# Patient Record
Sex: Female | Born: 1950 | Race: Black or African American | Hispanic: No | Smoking: Never smoker
Health system: Southern US, Community
[De-identification: ages and names within clinical notes are randomized; demographics above are authoritative.]

## PROBLEM LIST (undated history)

## (undated) DIAGNOSIS — I10 Essential (primary) hypertension: Secondary | ICD-10-CM

## (undated) DIAGNOSIS — I714 Abdominal aortic aneurysm, without rupture, unspecified: Secondary | ICD-10-CM

## (undated) DIAGNOSIS — M5430 Sciatica, unspecified side: Secondary | ICD-10-CM

## (undated) DIAGNOSIS — K5792 Diverticulitis of intestine, part unspecified, without perforation or abscess without bleeding: Secondary | ICD-10-CM

## (undated) DIAGNOSIS — I7121 Aneurysm of the ascending aorta, without rupture: Secondary | ICD-10-CM

## (undated) DIAGNOSIS — I719 Aortic aneurysm of unspecified site, without rupture: Secondary | ICD-10-CM

## (undated) DIAGNOSIS — I771 Stricture of artery: Secondary | ICD-10-CM

## (undated) DIAGNOSIS — D649 Anemia, unspecified: Secondary | ICD-10-CM

## (undated) DIAGNOSIS — G4730 Sleep apnea, unspecified: Secondary | ICD-10-CM

## (undated) DIAGNOSIS — K219 Gastro-esophageal reflux disease without esophagitis: Secondary | ICD-10-CM

## (undated) HISTORY — DX: Stricture of artery: I77.1

## (undated) HISTORY — DX: Aneurysm of the ascending aorta, without rupture: I71.21

## (undated) HISTORY — PX: CORONARY ANGIOPLASTY WITH STENT PLACEMENT: SHX49

## (undated) HISTORY — DX: Diverticulitis of intestine, part unspecified, without perforation or abscess without bleeding: K57.92

## (undated) HISTORY — DX: Gastro-esophageal reflux disease without esophagitis: K21.9

## (undated) HISTORY — DX: Sciatica, unspecified side: M54.30

## (undated) HISTORY — PX: ABDOMINAL HYSTERECTOMY: SHX81

## (undated) HISTORY — DX: Sleep apnea, unspecified: G47.30

## (undated) HISTORY — DX: Anemia, unspecified: D64.9

## (undated) HISTORY — DX: Abdominal aortic aneurysm, without rupture, unspecified: I71.40

## (undated) HISTORY — DX: Abdominal aortic aneurysm, without rupture: I71.4

## (undated) HISTORY — DX: Essential (primary) hypertension: I10

## (undated) HISTORY — DX: Aortic aneurysm of unspecified site, without rupture: I71.9

## (undated) HISTORY — PX: TOTAL ABDOMINAL HYSTERECTOMY W/ BILATERAL SALPINGOOPHORECTOMY: SHX83

---

## 2004-08-28 ENCOUNTER — Emergency Department: Payer: Self-pay | Admitting: Emergency Medicine

## 2004-08-28 ENCOUNTER — Other Ambulatory Visit: Payer: Self-pay

## 2004-08-28 IMAGING — CT CT HEAD WITHOUT CONTRAST
1 of 2 series · 16 of 30 positions shown, 20 images · non-contrast
Comparison: none

REASON FOR EXAM: faint feeling / "felt like was going to fall off chair" /
COMMENTS:

[Series 2: without · axial · non-contrast · 0.41mm/px · z∈[-146,-30]mm · 16 of 27 slices shown, 20 images]
[im 2/27  brain]
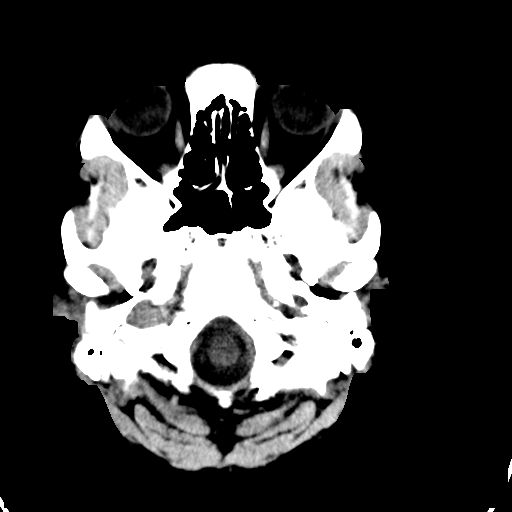
[im 2/27  bone]
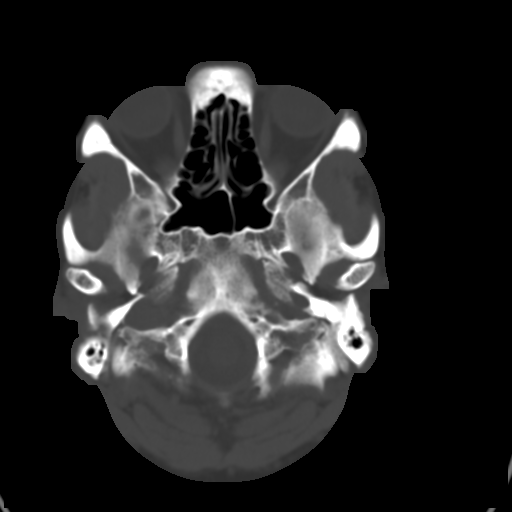
[im 4/27  brain]
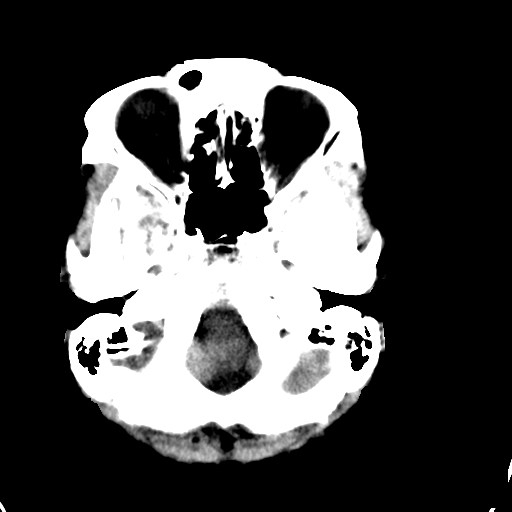
[im 5/27  brain]
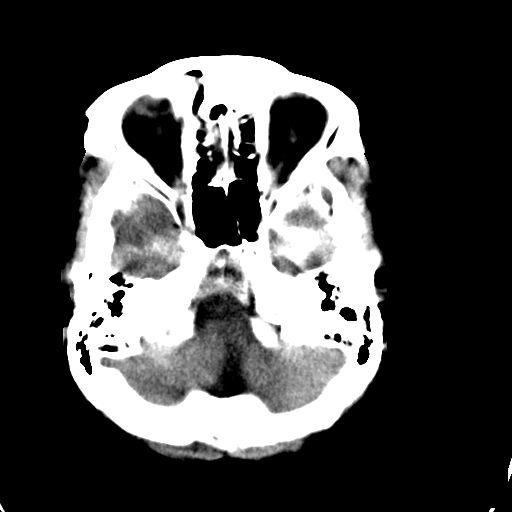
[im 7/27  brain]
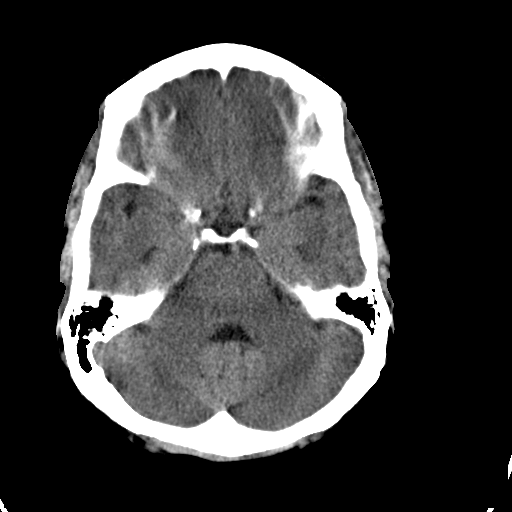
[im 8/27  brain]
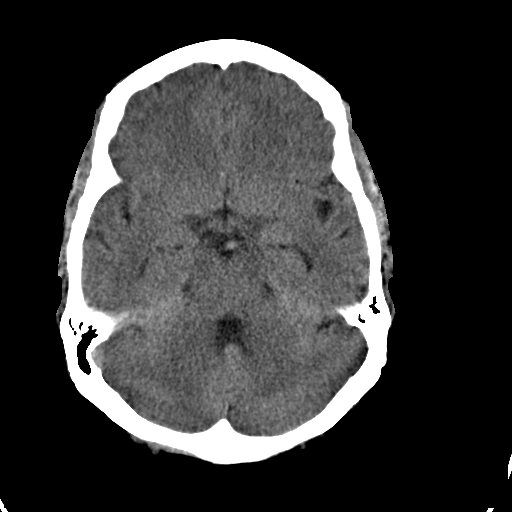
[im 8/27  bone]
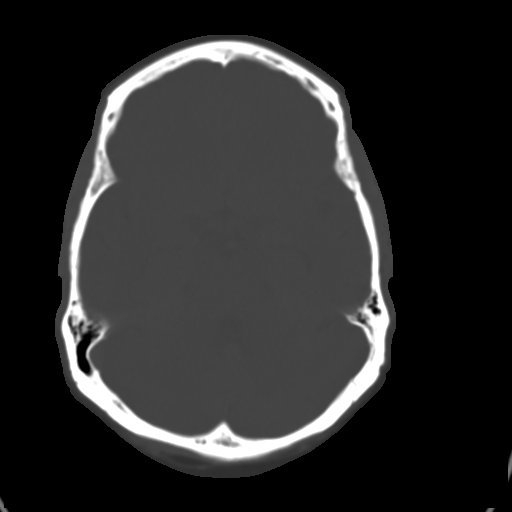
[im 10/27  brain]
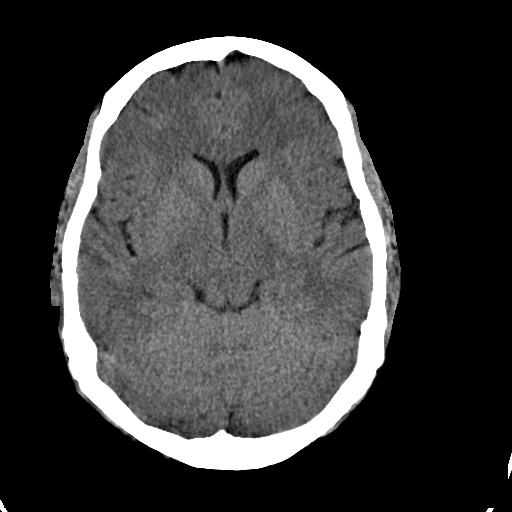
[im 11/27  brain]
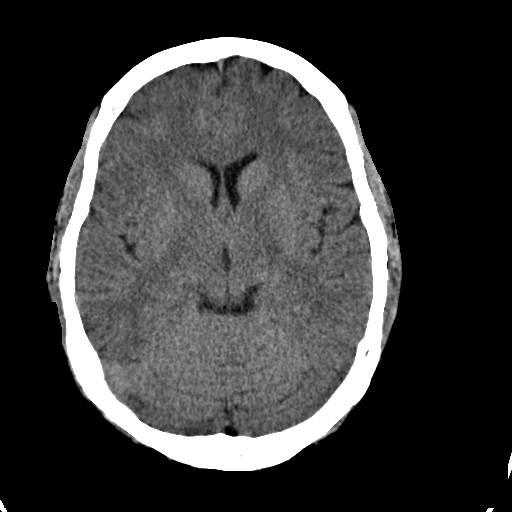
[im 13/27  brain]
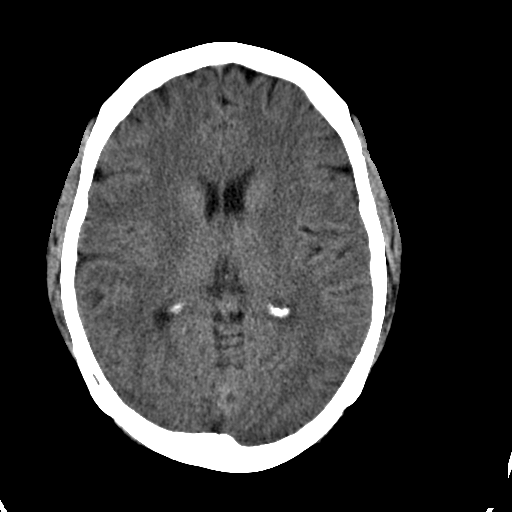
[im 14/27  brain]
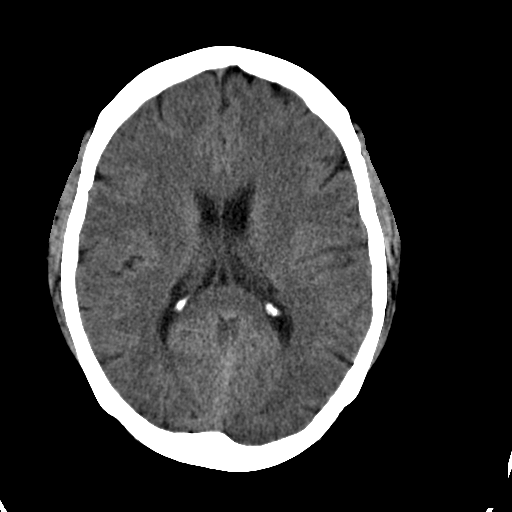
[im 14/27  bone]
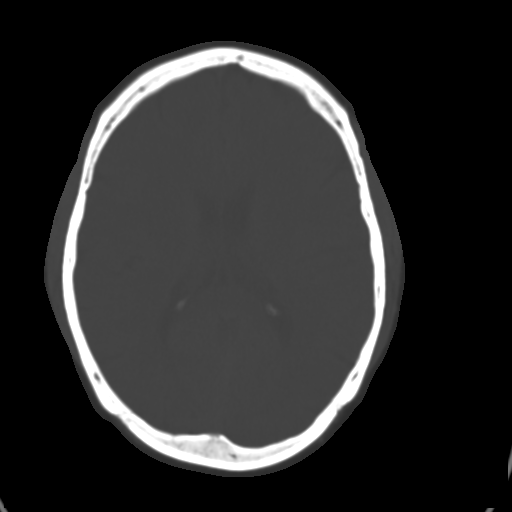
[im 16/27  brain]
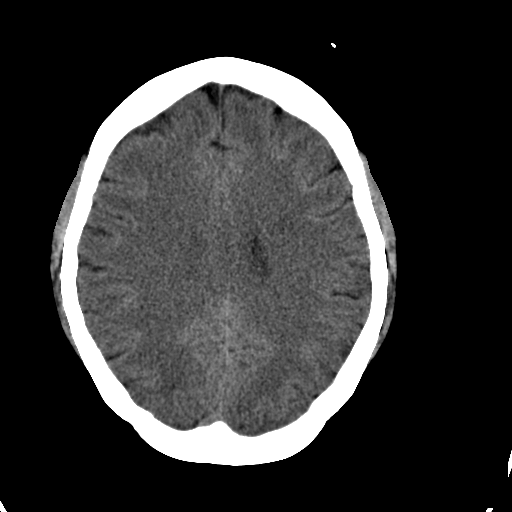
[im 17/27  brain]
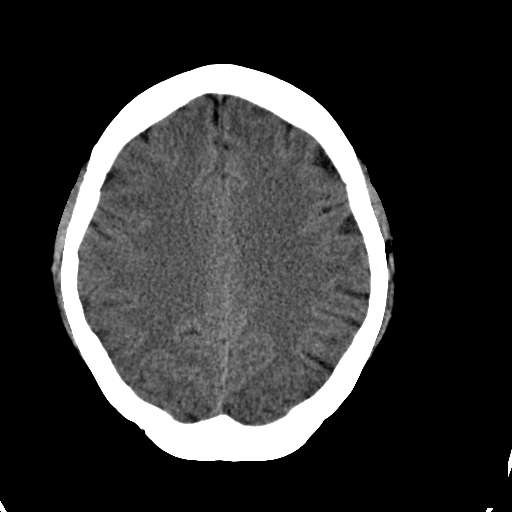
[im 19/27  brain]
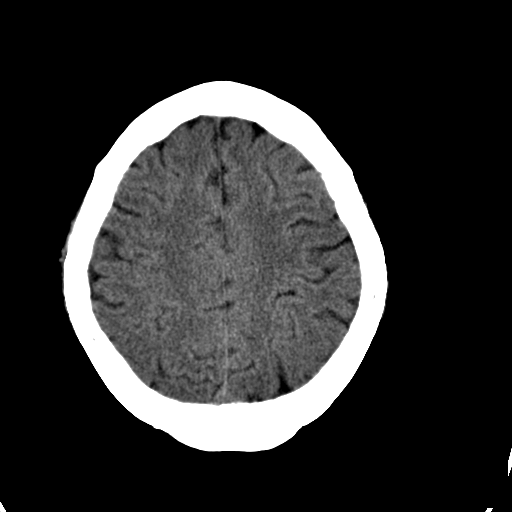
[im 20/27  brain]
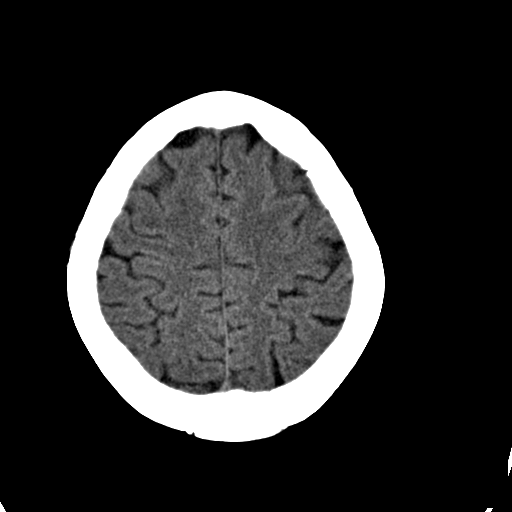
[im 20/27  bone]
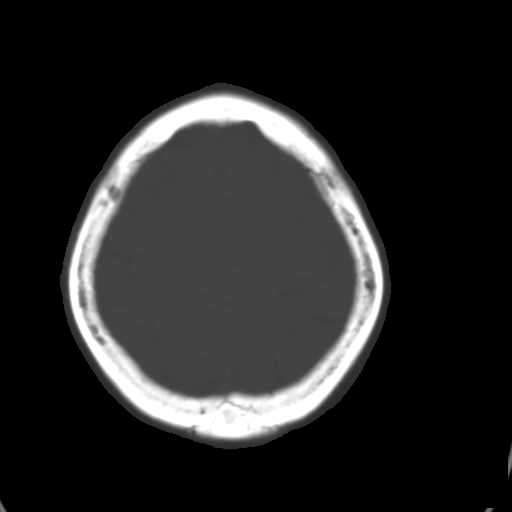
[im 22/27  brain]
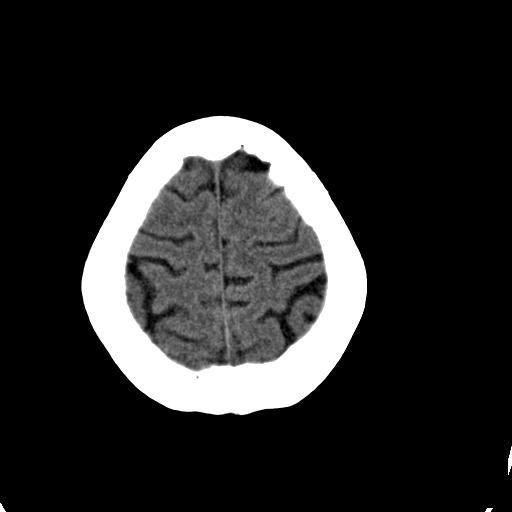
[im 23/27  brain]
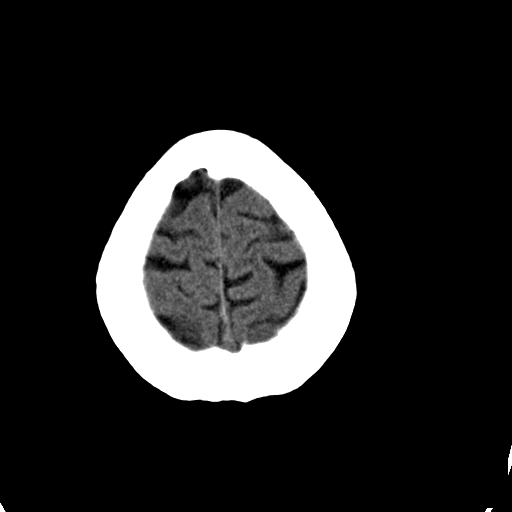
[im 25/27  brain]
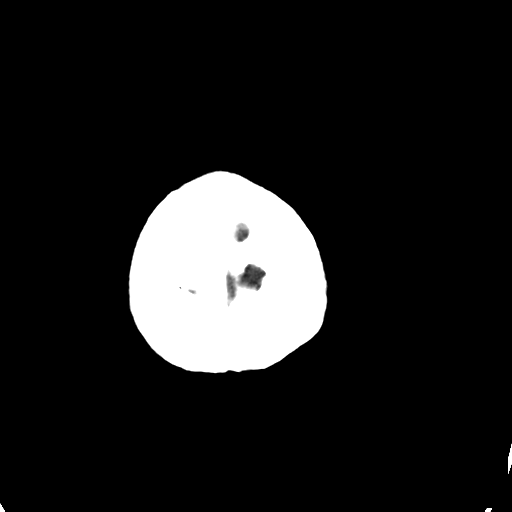

[16 of 30 positions shown; findings below may reference images not displayed]

PROCEDURE:     CT  - CT HEAD WITHOUT CONTRAST  - [DATE]  [DATE]

RESULT:     A 53 year-old-female feeling faint.

Contiguous 5 mm axial CT images were obtained from the skull base to the
vertex without intravenous contrast.

There are no prior studies for comparison.  The brain and CSF-containing
space are within normal limits without evidence for acute intracranial
hemorrhage or mass effect.  No acute bony abnormalities are seen.
IMPRESSION: 1.  No acute intracranial findings.

## 2005-02-22 ENCOUNTER — Ambulatory Visit: Payer: Self-pay | Admitting: Internal Medicine

## 2006-05-15 ENCOUNTER — Emergency Department: Payer: Self-pay | Admitting: Emergency Medicine

## 2007-03-20 ENCOUNTER — Emergency Department: Payer: Self-pay | Admitting: General Practice

## 2007-03-20 IMAGING — CT CT HEAD WITHOUT CONTRAST
1 series · 15 of 30 positions shown, 19 images · non-contrast
Comparison: none

RESULT:      CT of brain is performed emergently without contrast. There is
no prior exam for comparison.

The ventricles and sulci are normal. There is no hemorrhage. There is no
focal mass, mass-effect or midline shift. There is no evidence of edema or
territorial infarct. The bone windows demonstrate normal aeration of the
paranasal sinuses and mastoid air cells. There is no skull fracture
demonstrated. There are some low density areas within the calvarium.
Metastatic disease could present a similar fashion. This patient a history
of malignancy? A bone scan could be considered for further investigation.
These areas are intramedullary and low in attenuation. There does not appear
to be a definite cortical destruction of significant size. Myeloma would be
a consideration and may not be demonstrated on a bone scan.

[Series 2: soft tissue · axial · 0.39mm/px · z∈[-574,-438]mm · 15 of 30 slices shown, 19 images]
[im 2/30  brain]
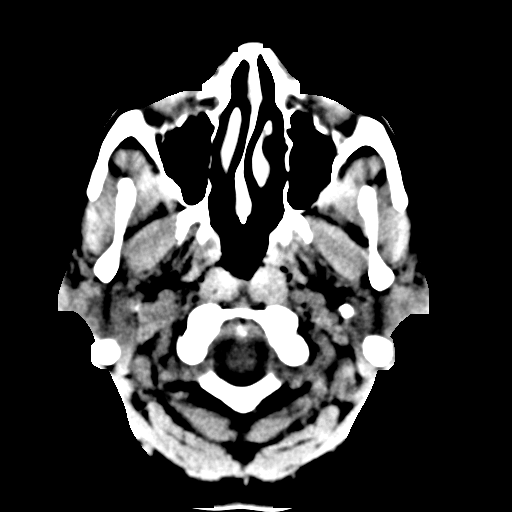
[im 2/30  bone]
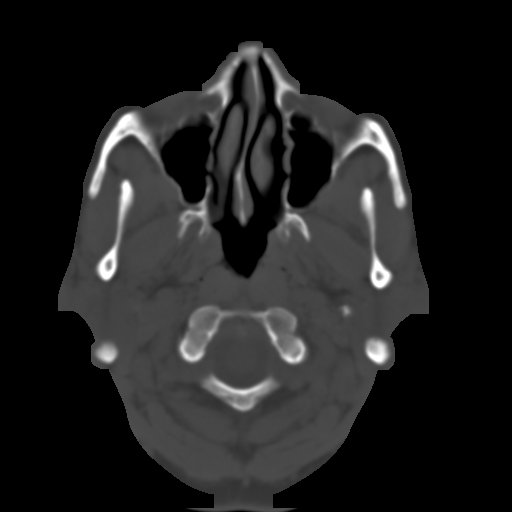
[im 4/30  brain]
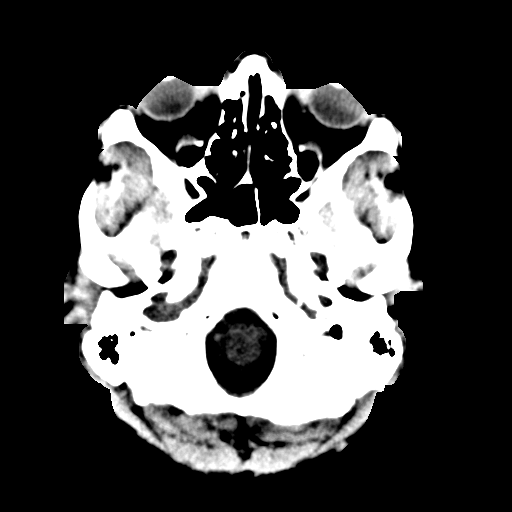
[im 6/30  brain]
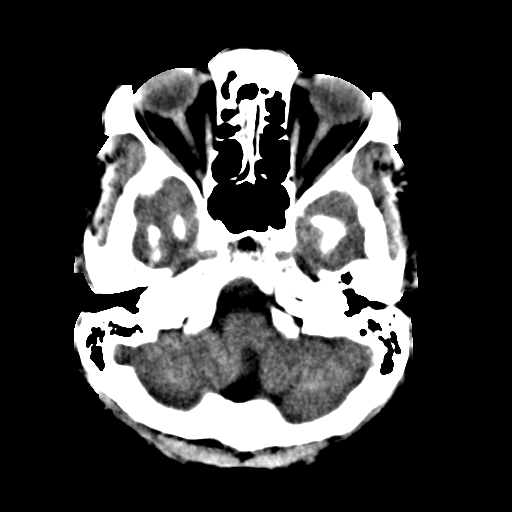
[im 8/30  brain]
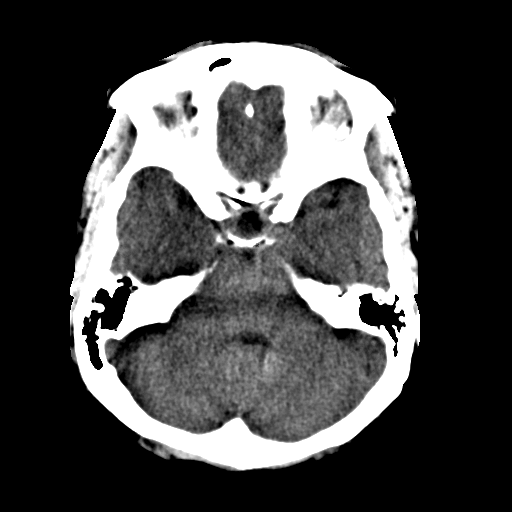
[im 10/30  brain]
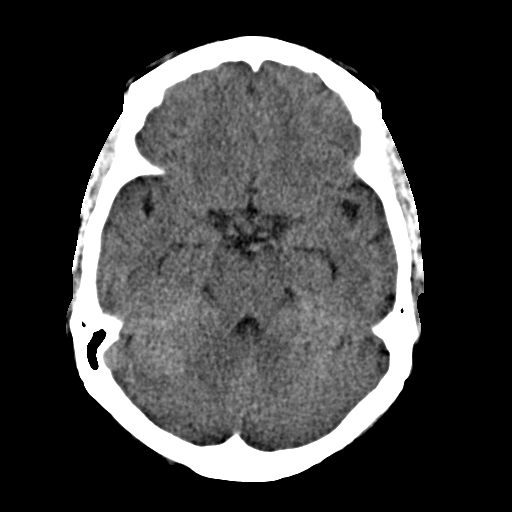
[im 10/30  bone]
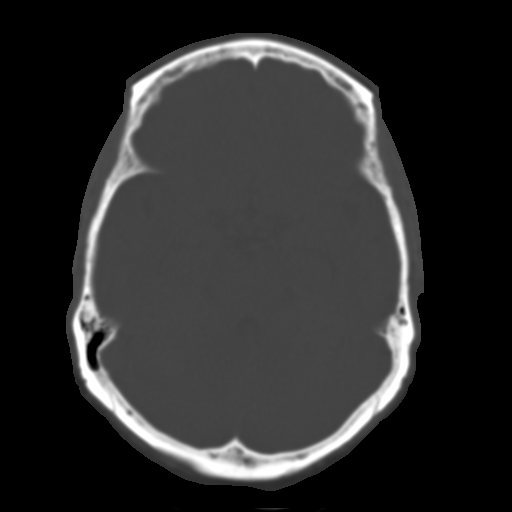
[im 12/30  brain]
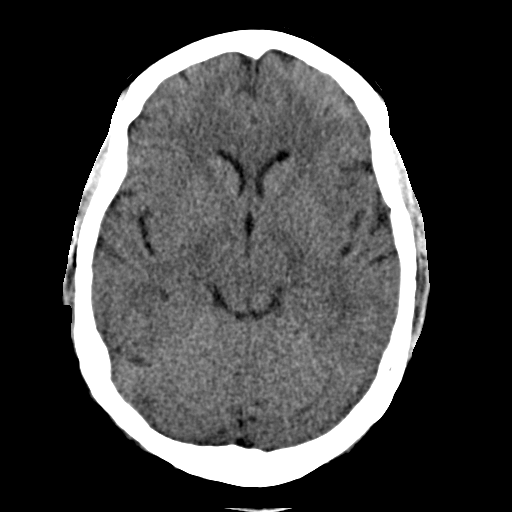
[im 14/30  brain]
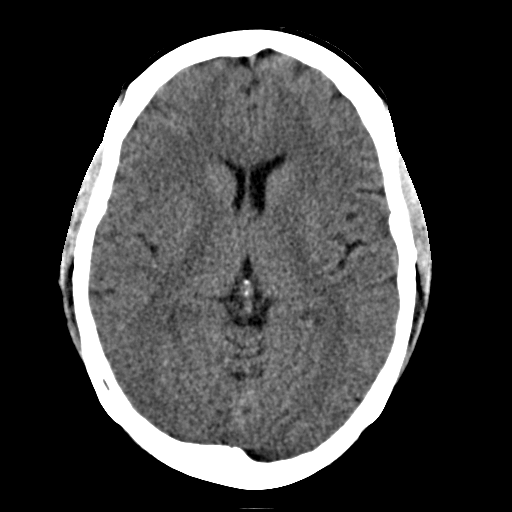
[im 16/30  brain]
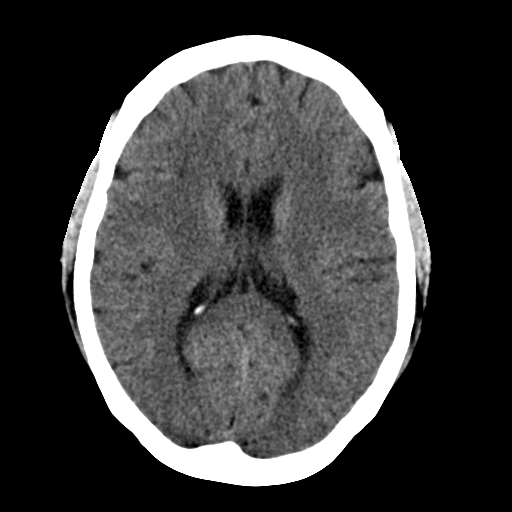
[im 17/30  brain]
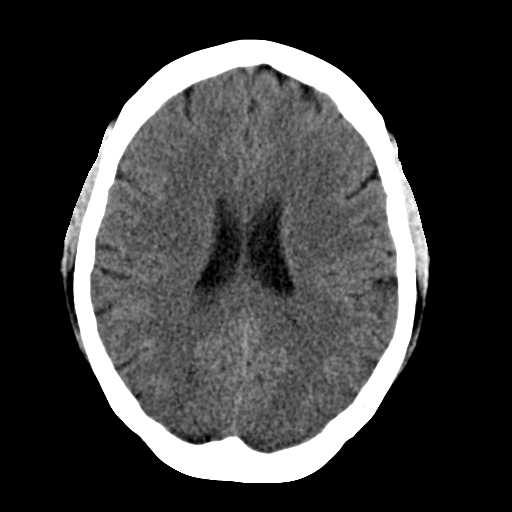
[im 17/30  bone]
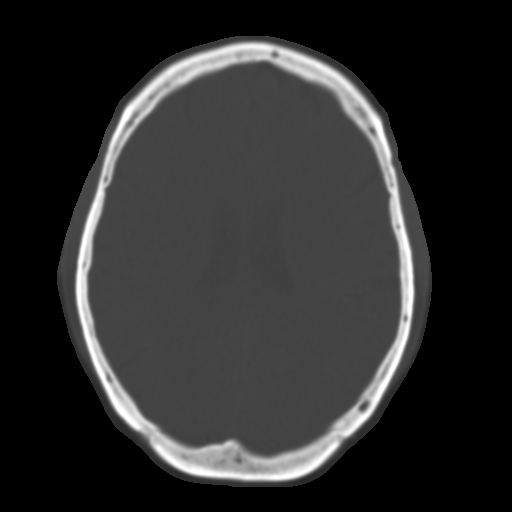
[im 19/30  brain]
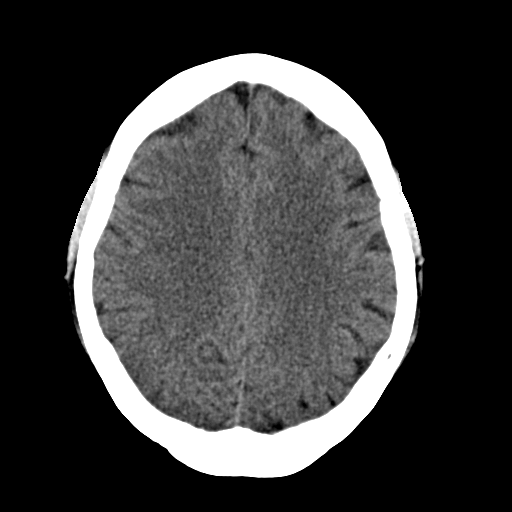
[im 21/30  brain]
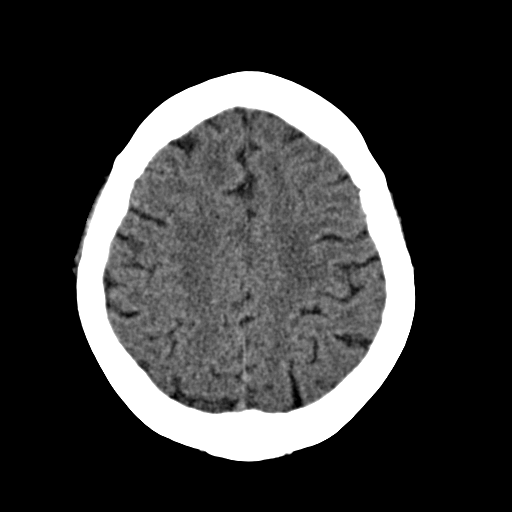
[im 23/30  brain]
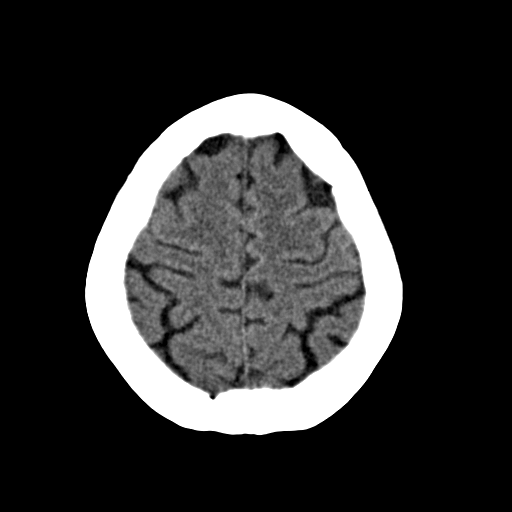
[im 25/30  brain]
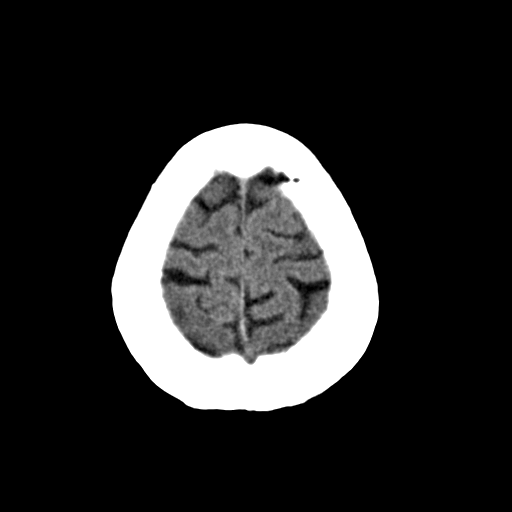
[im 25/30  bone]
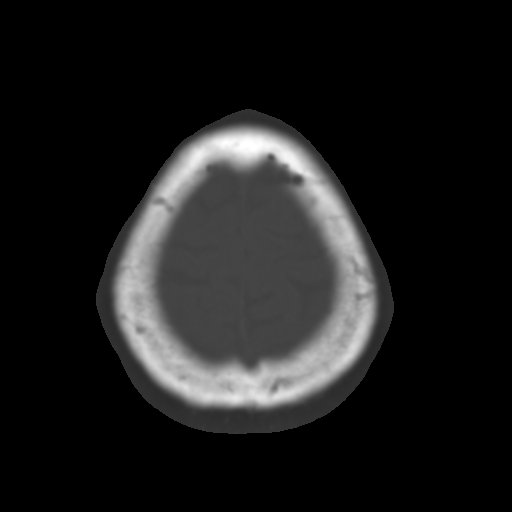
[im 27/30  brain]
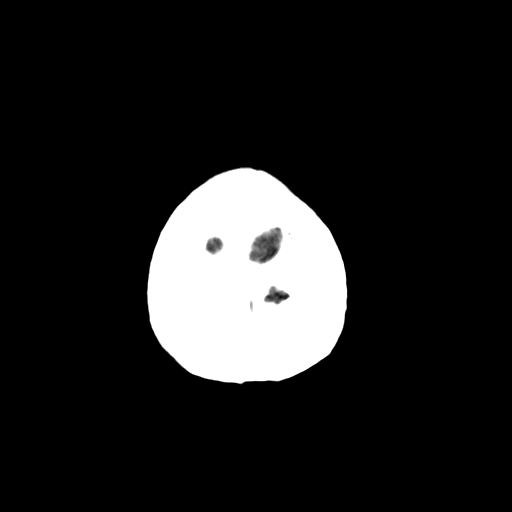
[im 29/30  brain]
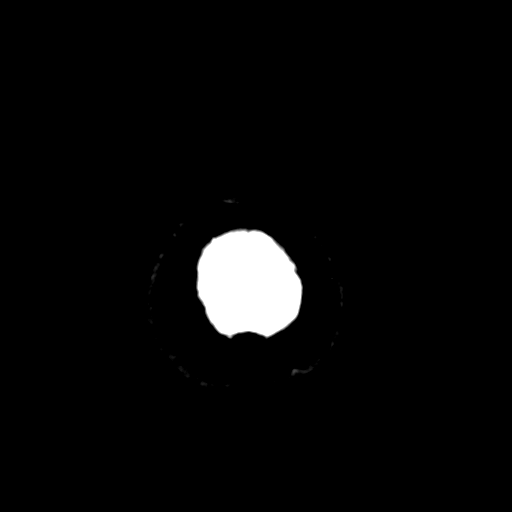

[15 of 30 positions shown; findings below may reference images not displayed]

IMPRESSION: 1. No acute intracranial abnormality.
2. Some small low density areas within the calvarium. These are nonspecific.
Patient is a history of malignancy no whole-body bone scan would be
recommended.

Amended impression:
1. The question in the body of the report should read" Does this patient
have a history of malignancy? ".

2. In the impression, the second impression third sentence should read" If
the patient has a history of malignancy then a whole-body bone scan would be
recommended. ".
3. Multiple myeloma is a consideration and may not be visible on a bone
scan. Laboratory correlation is recommended.

## 2007-03-20 IMAGING — CR DG CHEST 2V
1 series · 2 of 2 positions shown · non-contrast
Comparison: none

REASON FOR EXAM: chest pain pt in rm 8
COMMENTS:

PROCEDURE:     DXR - DXR CHEST PA (OR AP) AND LATERAL  - [DATE] [DATE]
RESULT:     The lungs are adequately inflated and clear. The heart and
pulmonary vascularity are within the limits of normal. There is no pleural
effusion. There are no acute bony abnormalities.

[Series 1: view not recorded · 0.17mm/px · 2 of 2 slices shown]
[im 1/2]
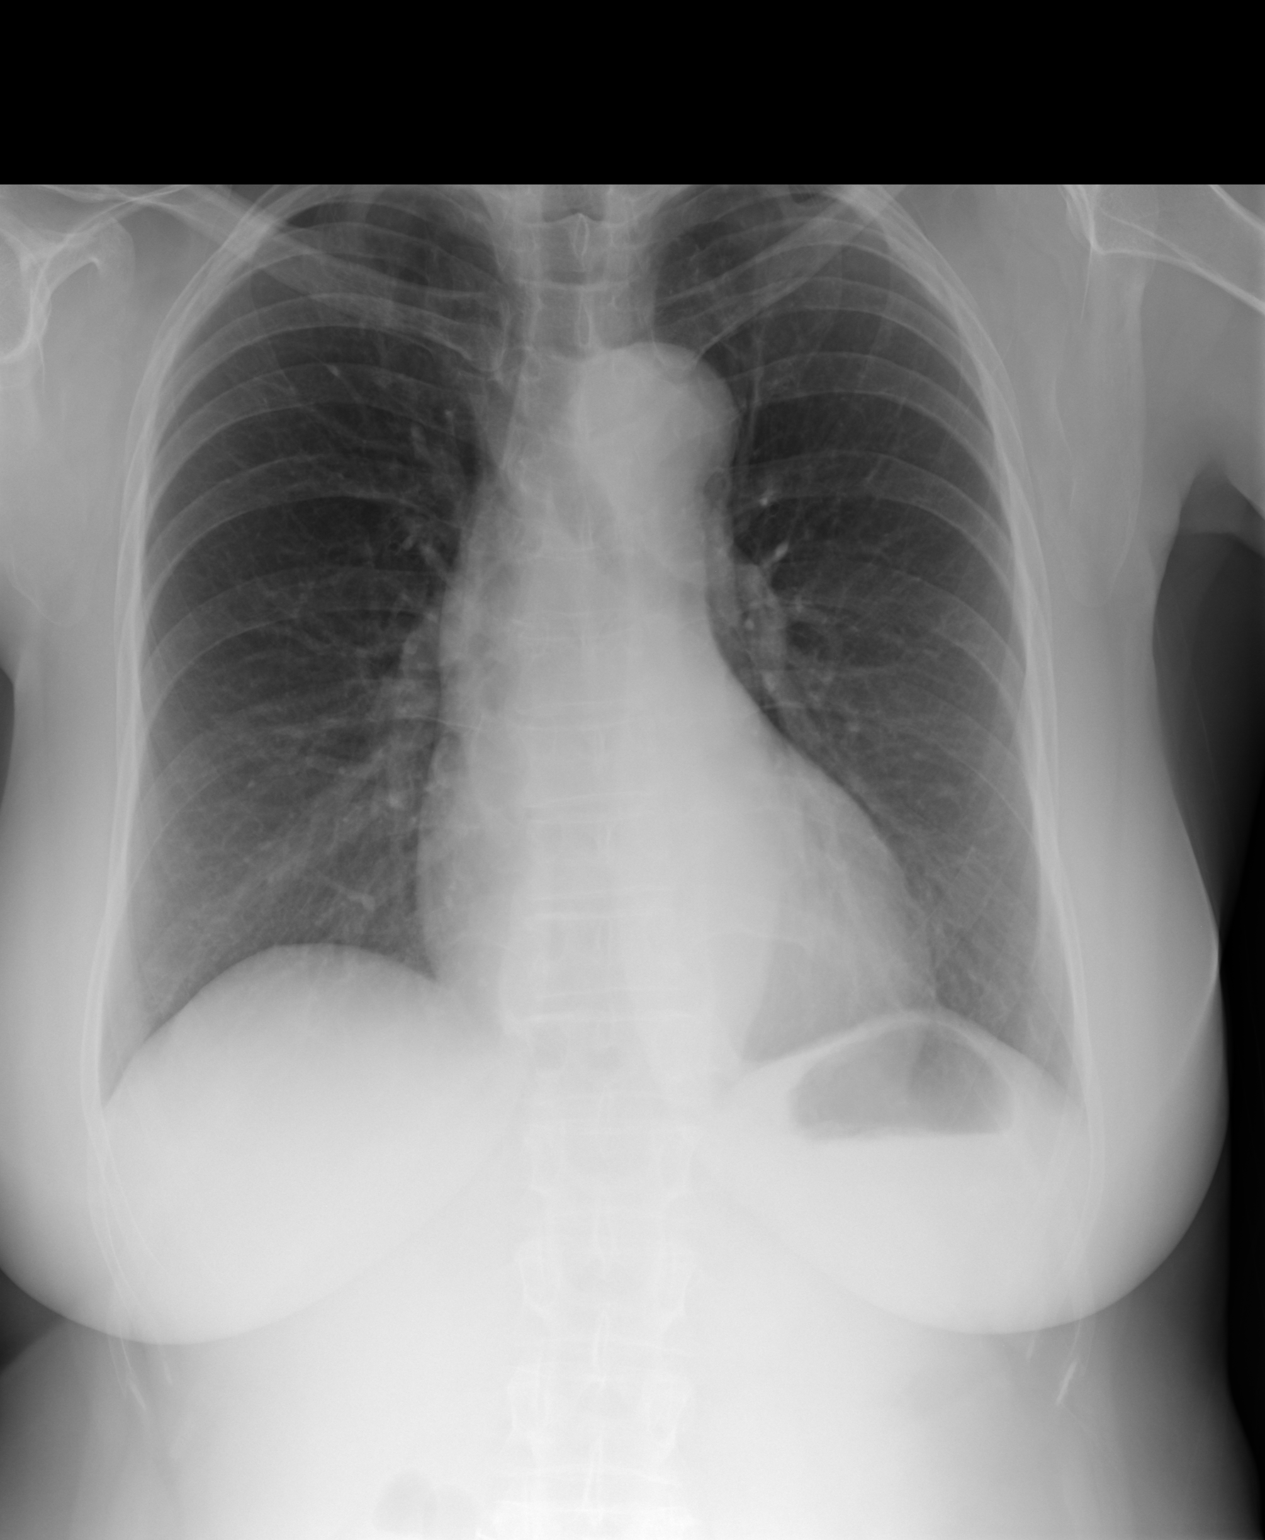
[im 2/2]
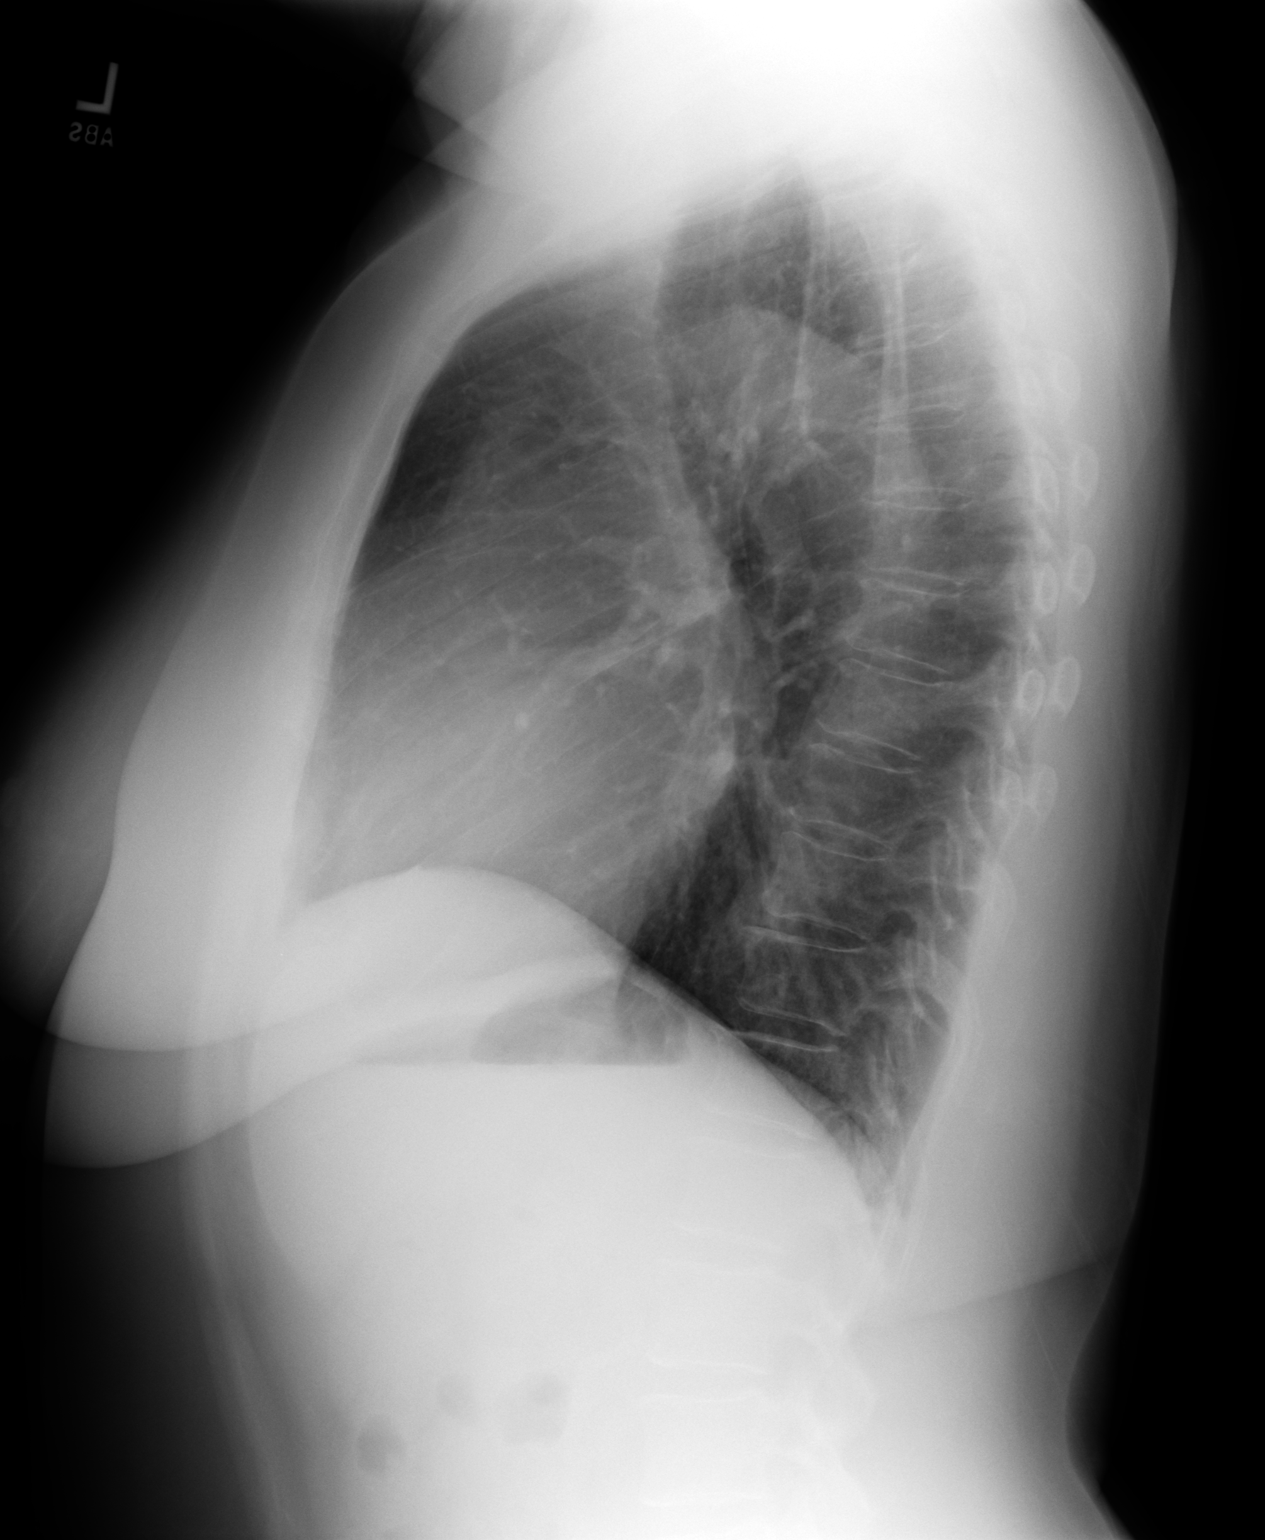

[2 of 2 positions shown; findings below may reference images not displayed]

IMPRESSION: I do not see acute cardiopulmonary abnormality. Of note is
some flattening of the left humeral head. This may be positional but
correlation with any symptoms associated with the left shoulder would be of
value.

## 2007-03-23 ENCOUNTER — Other Ambulatory Visit: Payer: Self-pay

## 2007-03-23 ENCOUNTER — Emergency Department: Payer: Self-pay | Admitting: Emergency Medicine

## 2007-03-24 IMAGING — CR DG CHEST 2V
1 series · 2 of 2 positions shown · non-contrast
Comparison: none

REASON FOR EXAM: chest pain
COMMENTS:

PROCEDURE:     DXR - DXR CHEST PA (OR AP) AND LATERAL  - [DATE]  [DATE]
RESULT:     The lungs are clear.  The cardiac silhouette and visualized bony
skeleton are unremarkable.

[Series 1: view not recorded · 0.17mm/px · 2 of 2 slices shown]
[im 1/2]
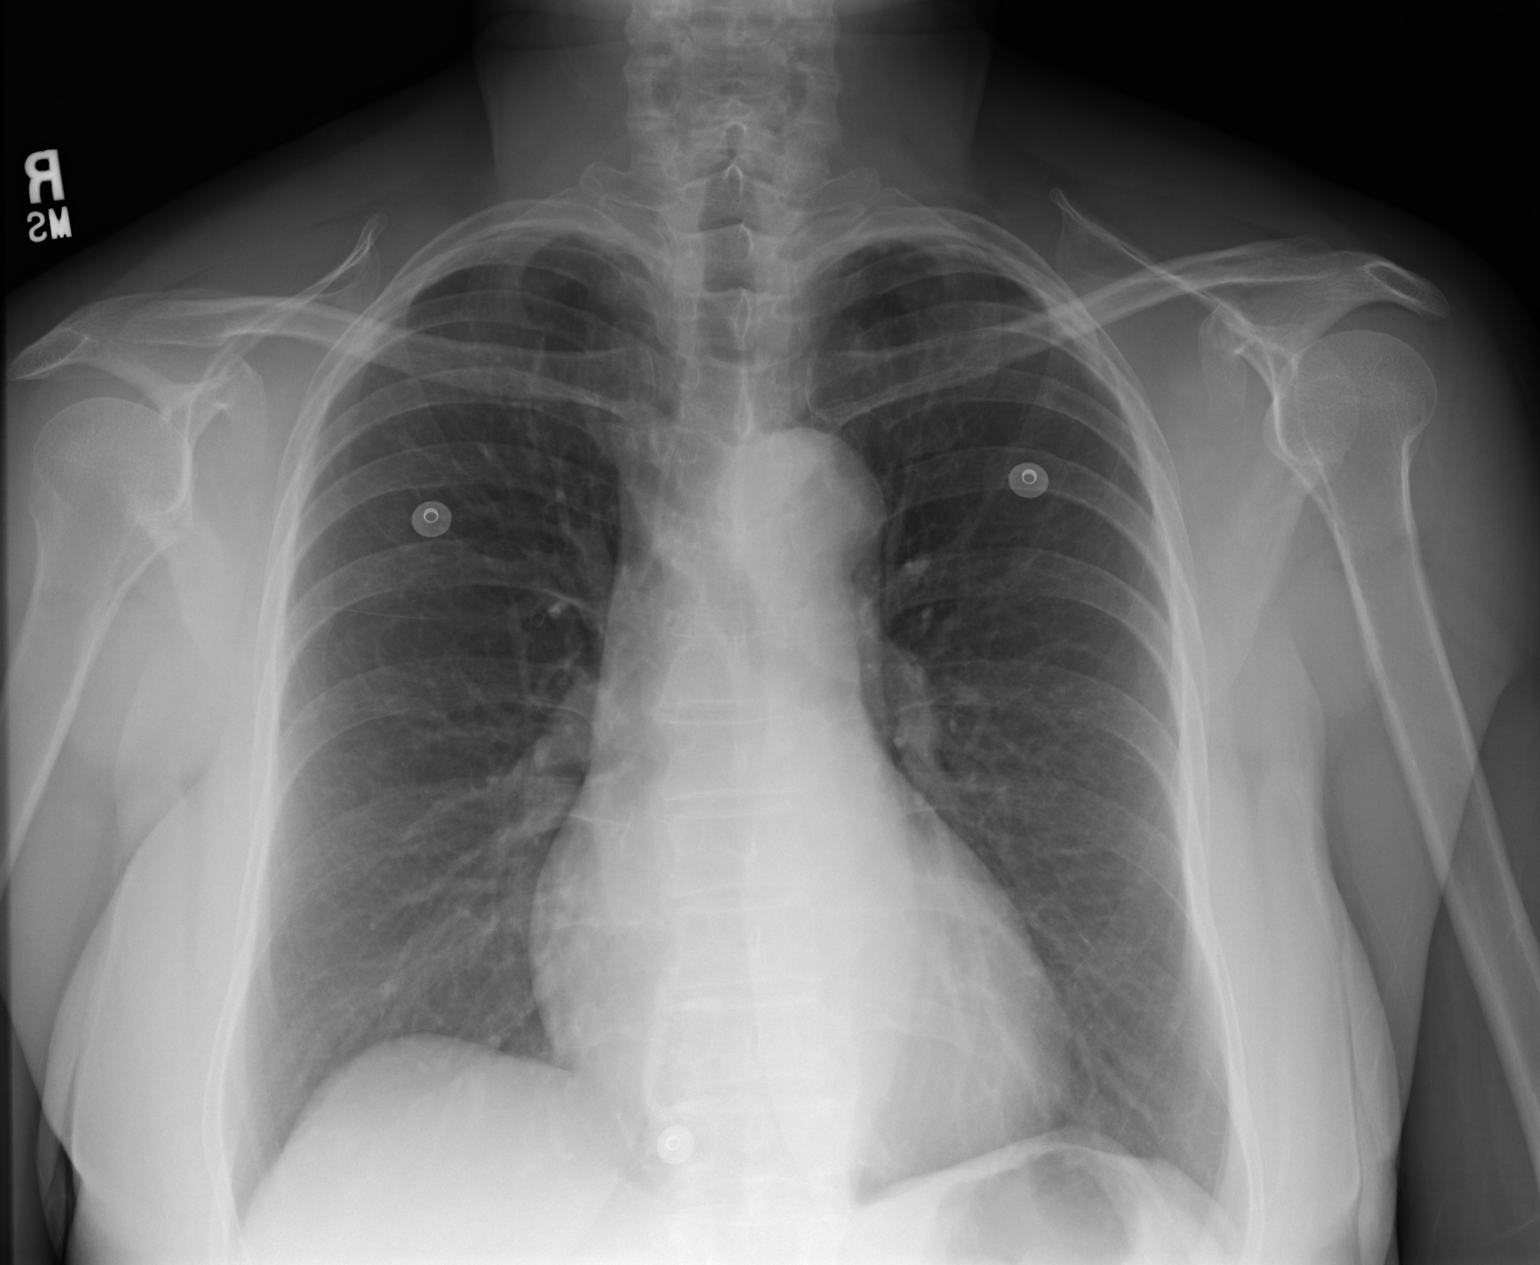
[im 2/2]
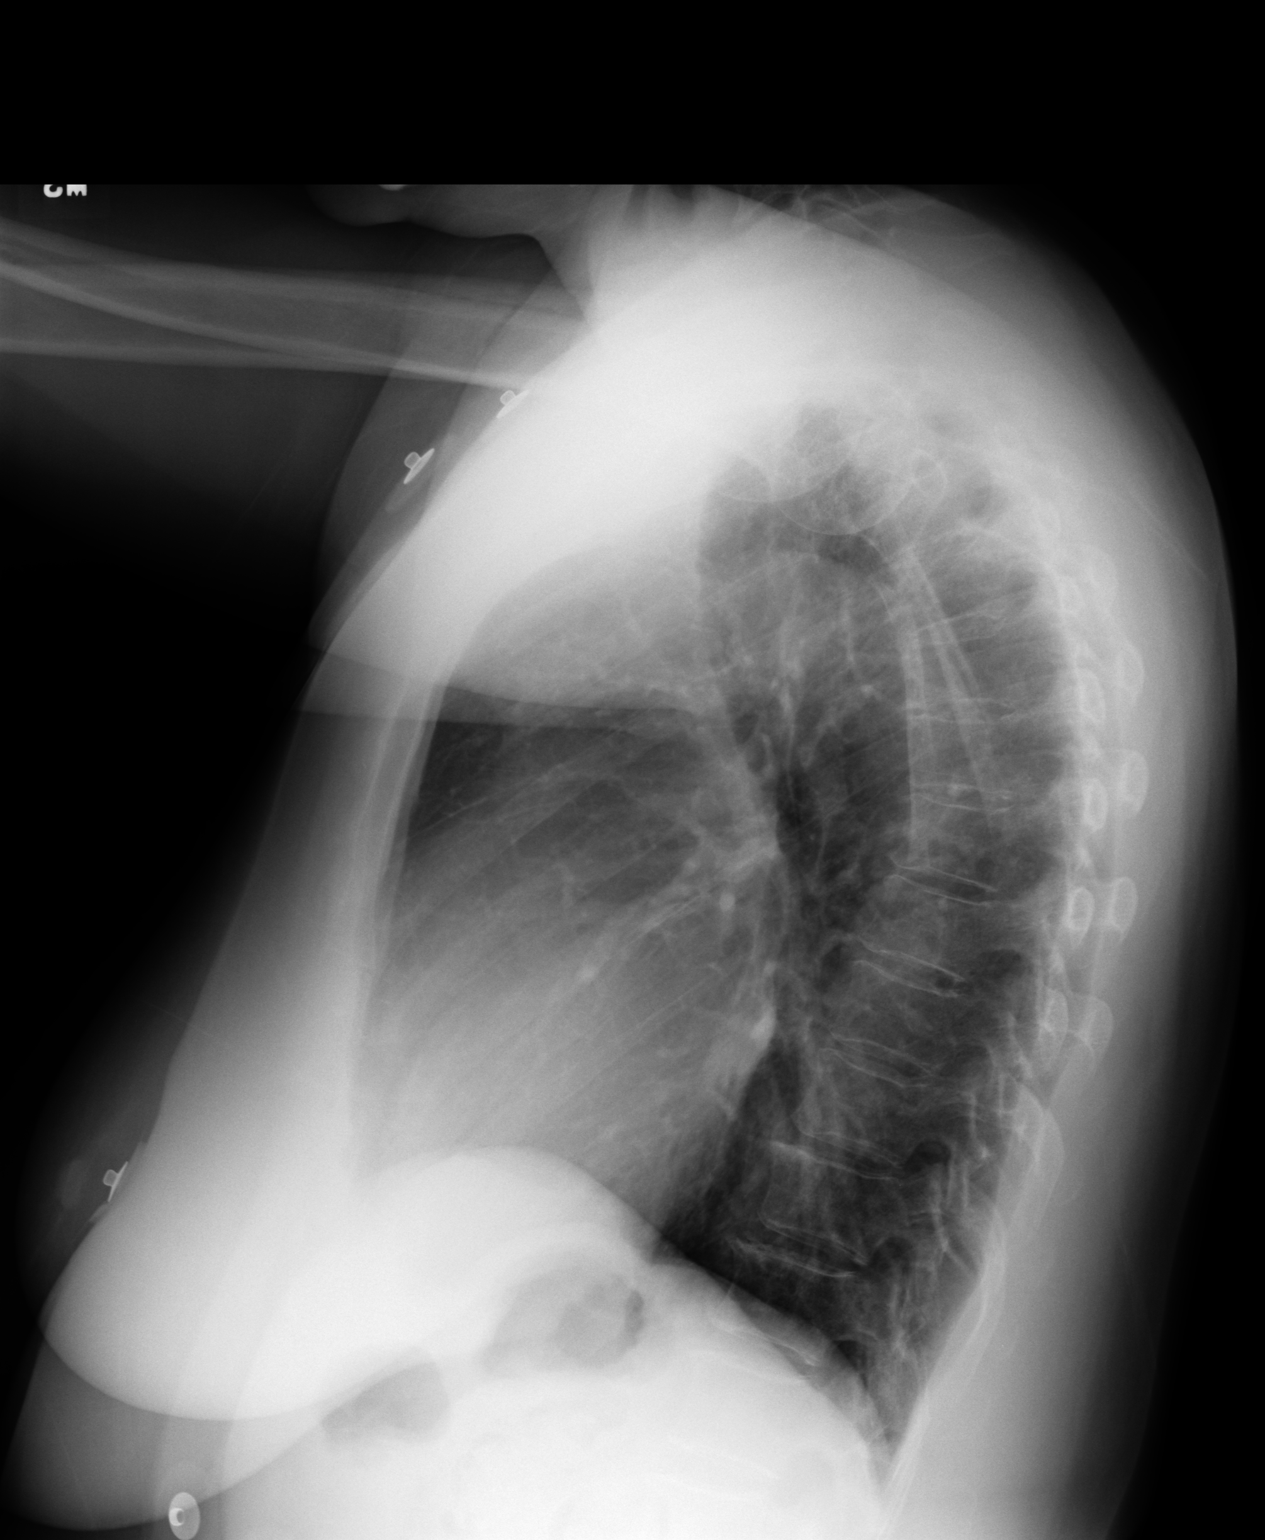

[2 of 2 positions shown; findings below may reference images not displayed]

IMPRESSION: Chest radiograph without evidence of acute cardiopulmonary
disease.

## 2007-03-29 ENCOUNTER — Ambulatory Visit: Payer: Self-pay | Admitting: Family Medicine

## 2007-03-29 IMAGING — NM NUCLEAR MEDICINE WHOLE BODY BONE SCINTIGRAPHY
2 series · 6 of 6 positions shown · non-contrast
Comparison: none

REASON FOR EXAM: sob headaches rib pain hx malignancy CT head on [DATE]
shows low density areas...
COMMENTS:

[Series 1000: statics · 2.40mm/px · 2 acquisitions, 4 frames shown]
[im 1/2]
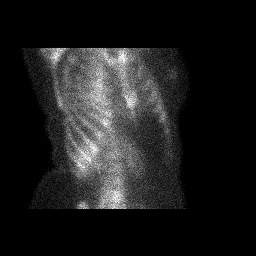
[im 1/2]
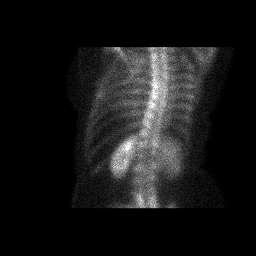
[im 2/2]
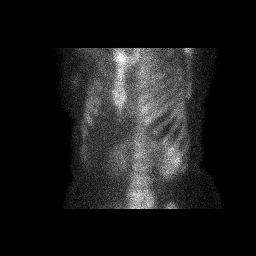
[im 2/2]
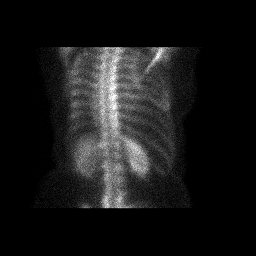

[Series 1000: 3 hr wholebody · 2.40mm/px · 2 of 2 frames shown]
[frame 1/2]
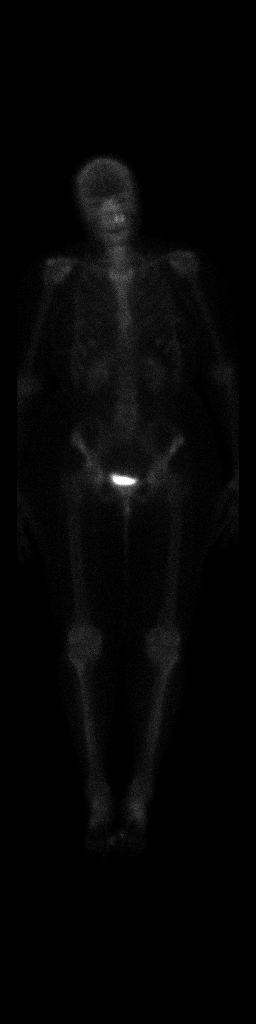
[frame 2/2]
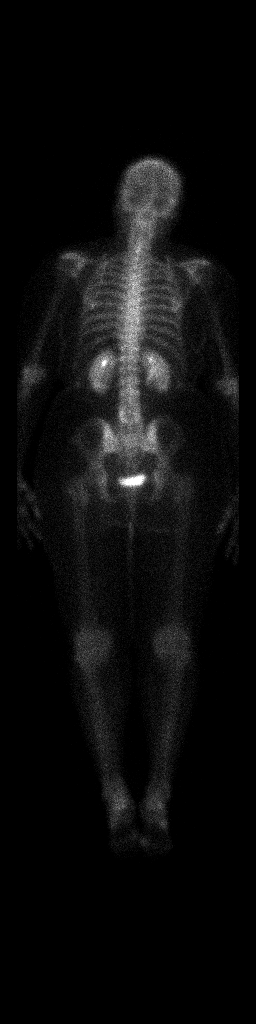

[6 of 6 positions shown; findings below may reference images not displayed]

PROCEDURE:     NM  - NM BONE WB 3 HR [DATE]  [DATE]

RESULT:     The patient has a history of RIGHT rib pain and a history of
prior cervical carcinoma. Following injection of 19.2 mCi technetium 99m
MDP, total body bone scan was performed in the anterior and posterior views.
Additional oblique views of the ribs were obtained bilaterally. There is
observed a normal distribution of tracer activity throughout the skeletal
system. Specifically, no abnormal areas of increased tracer activity
suspicious for metastatic disease are identified. Tracer activity is present
in both kidneys.
IMPRESSION: 1.     Normal bone scan.

## 2007-04-03 ENCOUNTER — Ambulatory Visit: Payer: Self-pay | Admitting: Internal Medicine

## 2007-04-04 ENCOUNTER — Emergency Department: Payer: Self-pay | Admitting: Emergency Medicine

## 2007-04-04 ENCOUNTER — Other Ambulatory Visit: Payer: Self-pay

## 2007-04-04 IMAGING — CR DG CHEST 1V PORT
1 series · 1 of 1 positions shown · non-contrast
Comparison: none

REASON FOR EXAM: Chest pain
COMMENTS:

PROCEDURE:     DXR - DXR PORTABLE CHEST SINGLE VIEW  - [DATE] [DATE]
RESULT:     Comparison is made to the prior exam of [DATE]. The lung
fields are clear. The heart, mediastinum and osseous structures show no
acute changes. Monitoring electrodes are present.

[view not recorded]
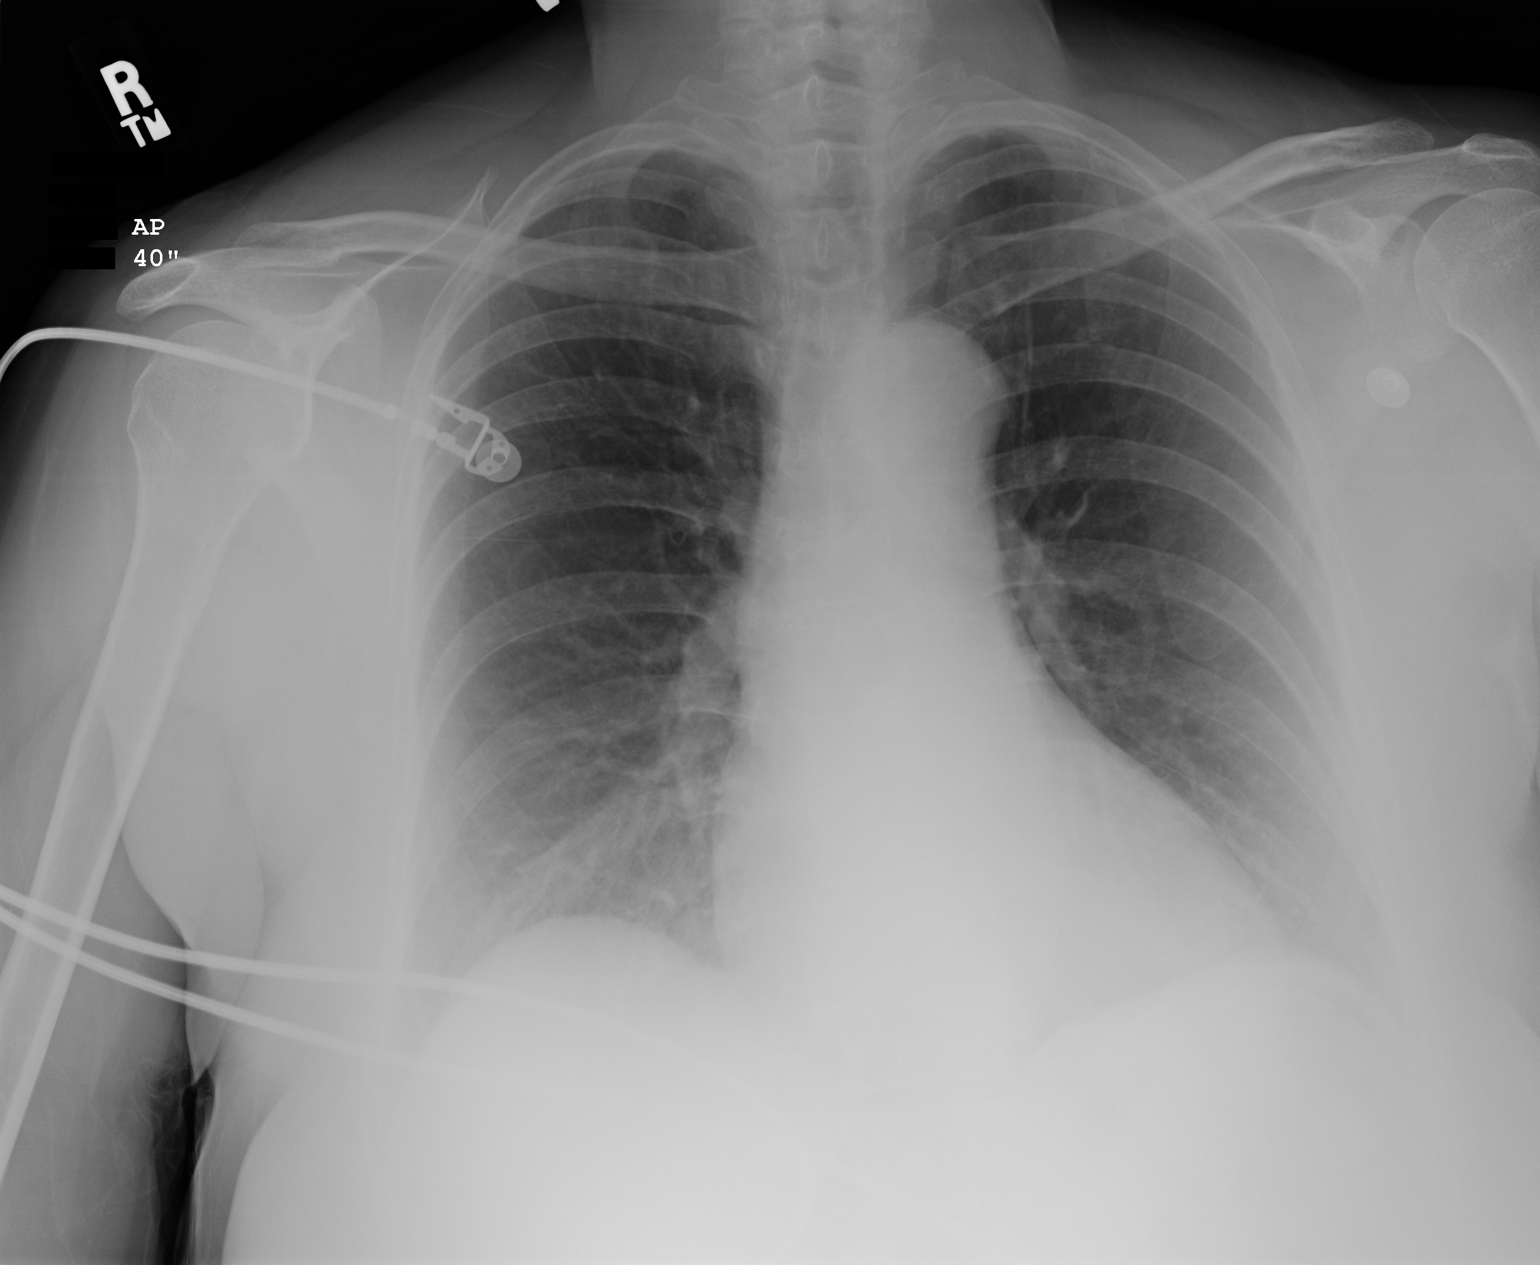

[1 of 1 positions shown; findings below may reference images not displayed]

IMPRESSION: No significant abnormalities are noted.

## 2007-04-05 ENCOUNTER — Ambulatory Visit: Payer: Self-pay | Admitting: Cardiovascular Disease

## 2007-04-17 ENCOUNTER — Ambulatory Visit: Payer: Self-pay | Admitting: Internal Medicine

## 2007-04-26 ENCOUNTER — Ambulatory Visit: Payer: Self-pay | Admitting: Gastroenterology

## 2007-04-26 IMAGING — CR METASTATIC BONE SURVEY
1 series · 8 of 8 positions shown · non-contrast
Comparison: none

REASON FOR EXAM: H/O ABNORMAL CALVARIUN LESIONS FORLYTIC LESIONS
COMMENTS:

PROCEDURE:     DXR - DXR BONE SURVEY METASTATIC  - [DATE]  [DATE]
RESULT:     Metastatic bone survey reveals no lytic or blastic lesions
suspicious for metastatic disease. There are noted degenerative changes of
the lower cervical spine.

[Series 1: view not recorded · 0.17mm/px · 8 of 20 slices shown]
[im 1/20]
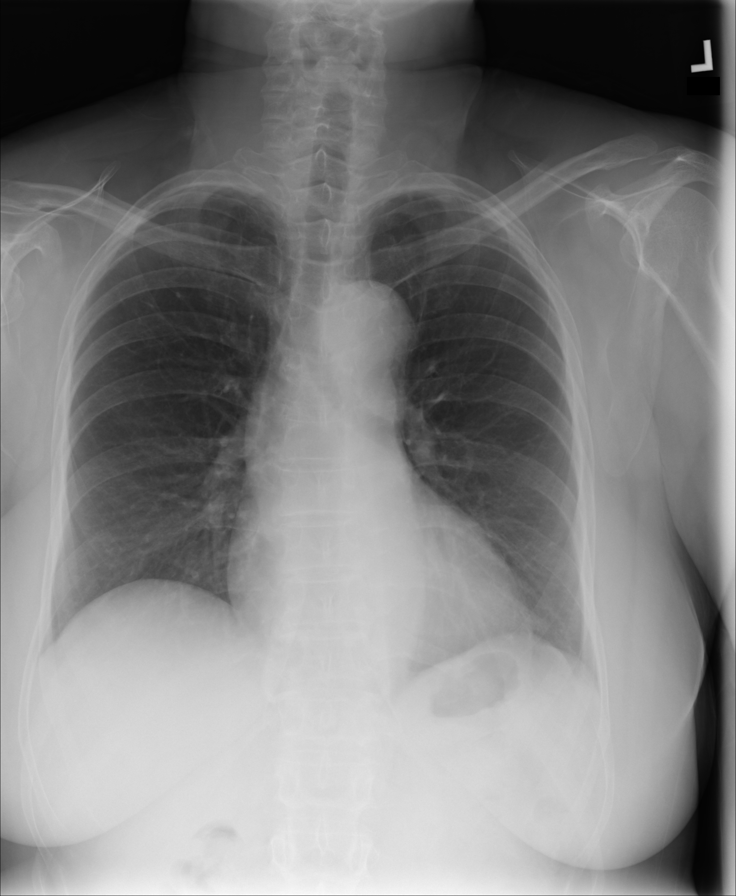
[im 3/20]
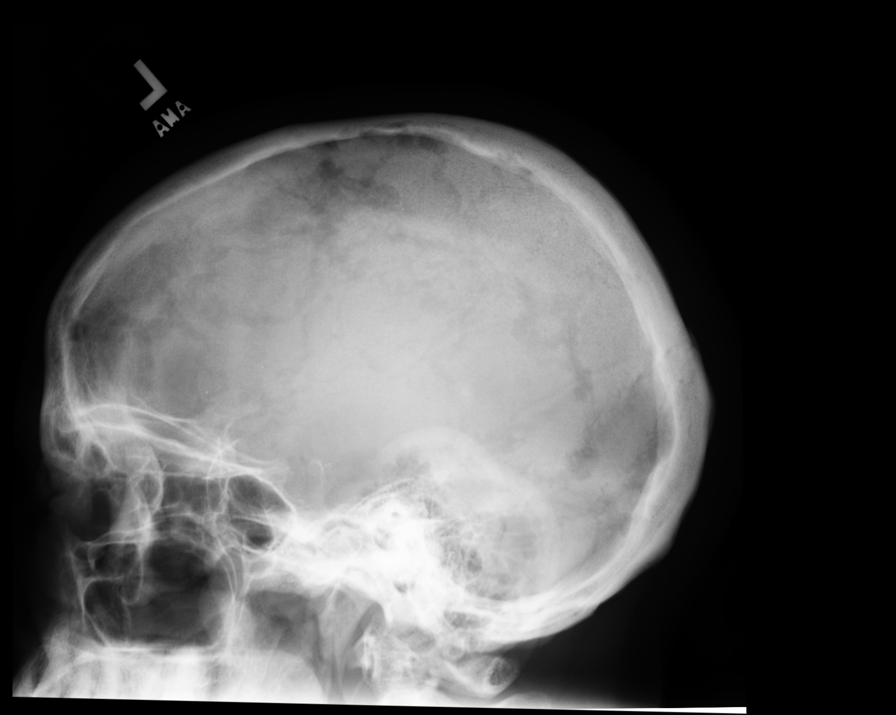
[im 6/20]
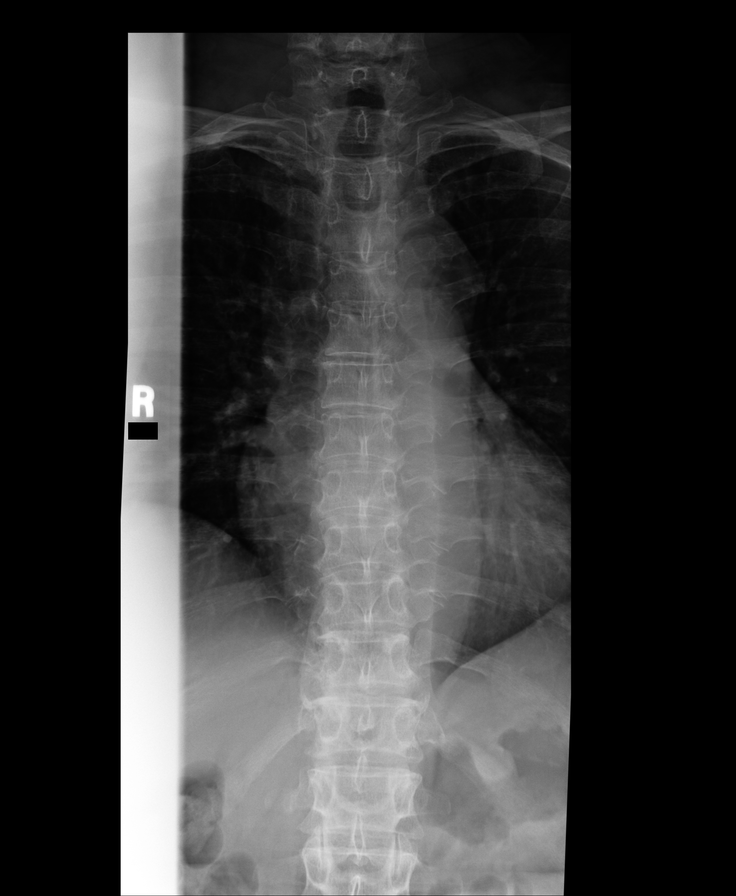
[im 9/20]
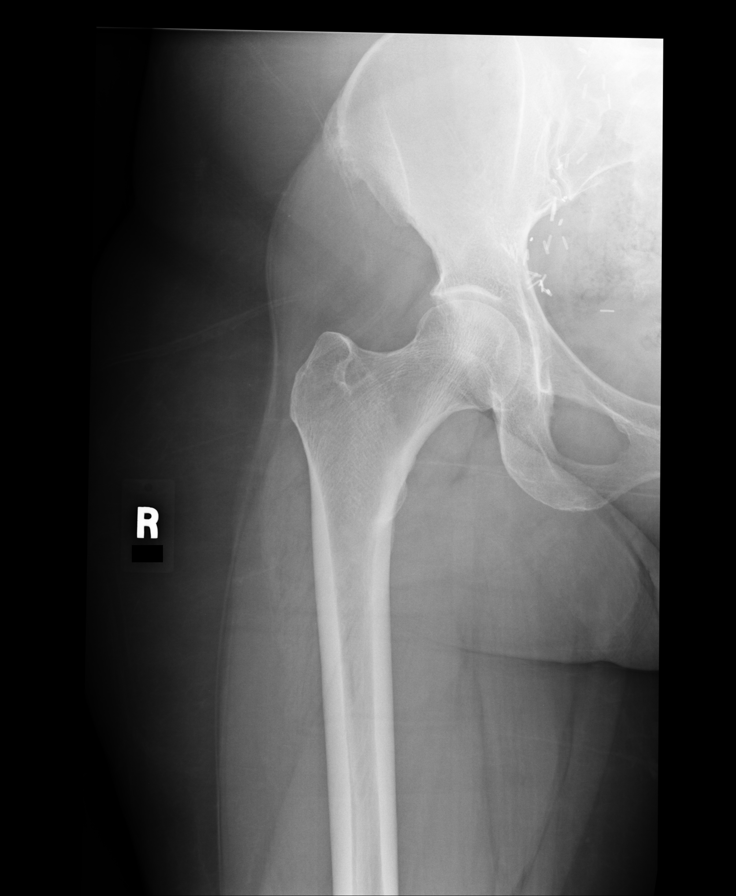
[im 11/20]
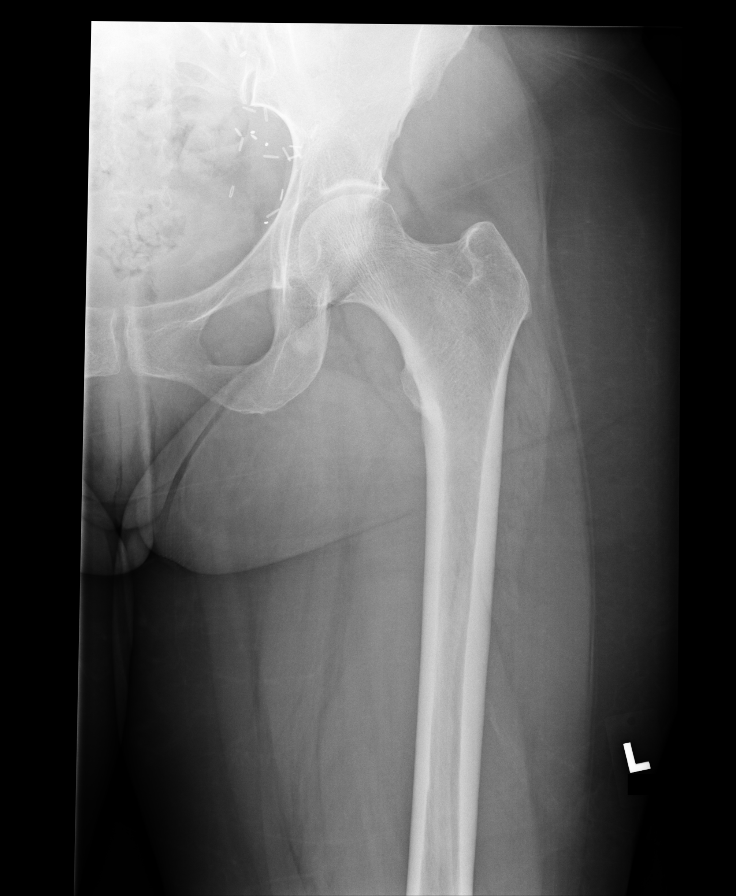
[im 14/20]
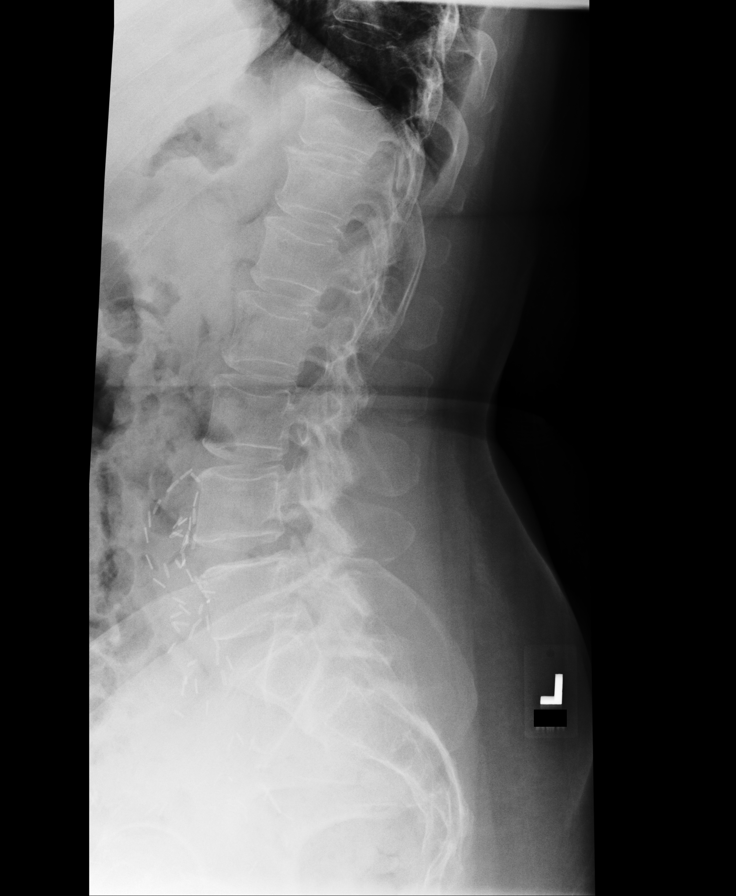
[im 17/20]
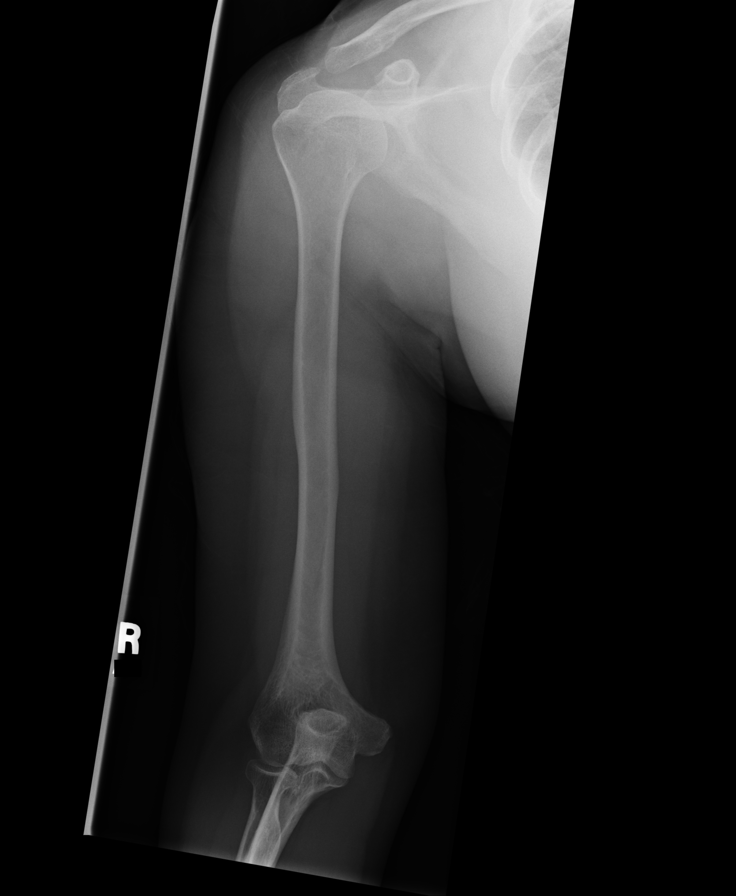
[im 20/20]
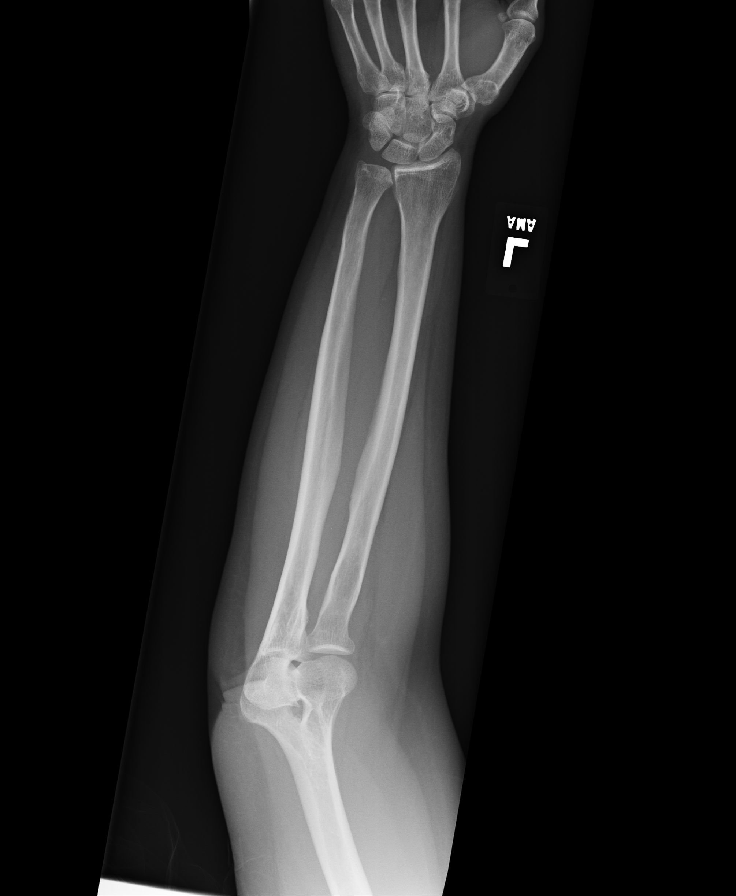

[8 of 8 positions shown; findings below may reference images not displayed]

IMPRESSION: 1.     No findings suspicious for metastatic disease are identified.

## 2007-04-26 IMAGING — US ABDOMEN ULTRASOUND
1 series · 17 of 25 positions shown · non-contrast
Comparison: none

REASON FOR EXAM: RUQ Abd Pain
COMMENTS:

[Series 1: abdomen ultrasound · 17 of 69 slices shown]
[im 1/69]
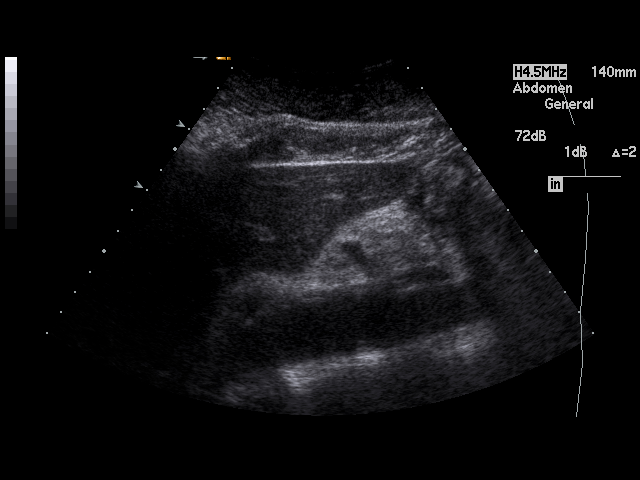
[im 6/69]
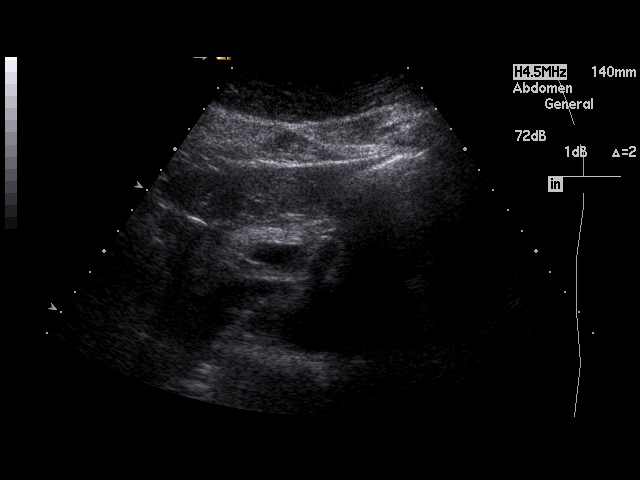
[im 9/69]
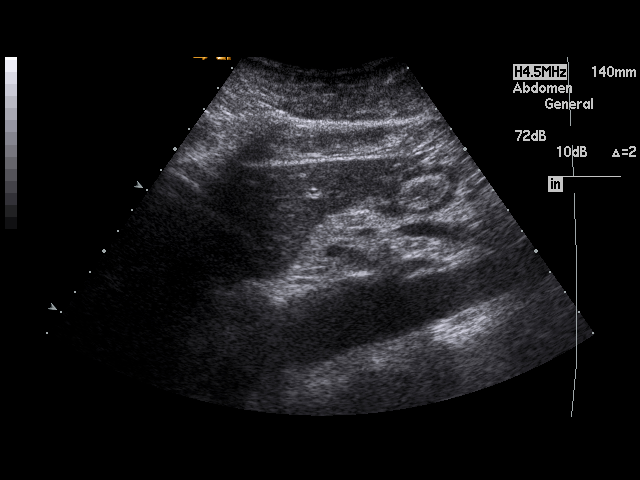
[im 15/69]
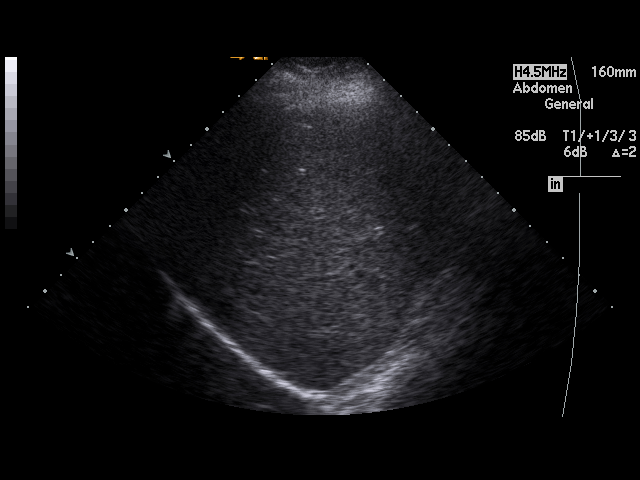
[im 18/69]
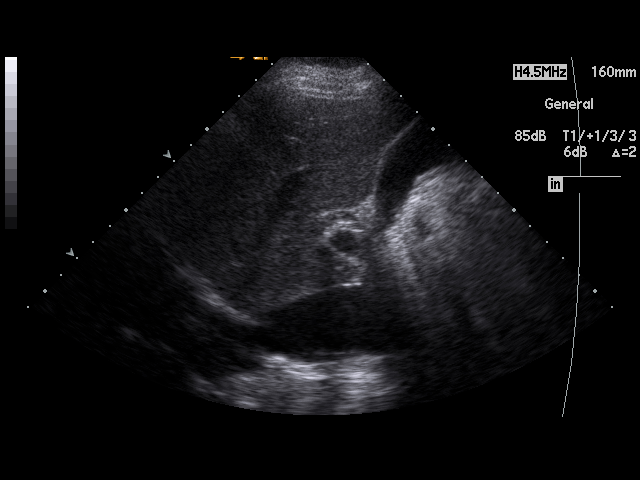
[im 23/69]
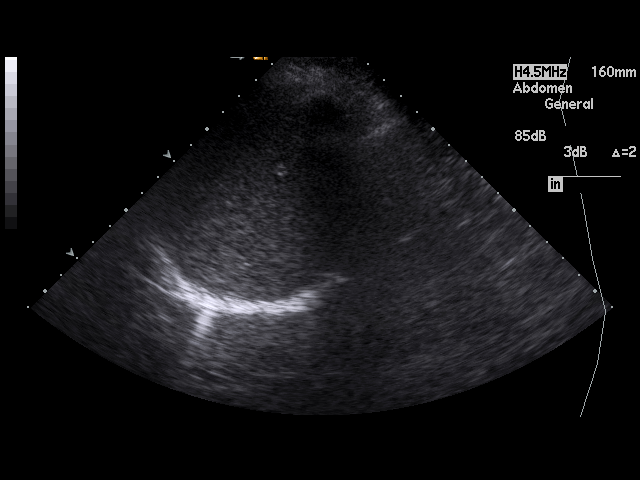
[im 26/69]
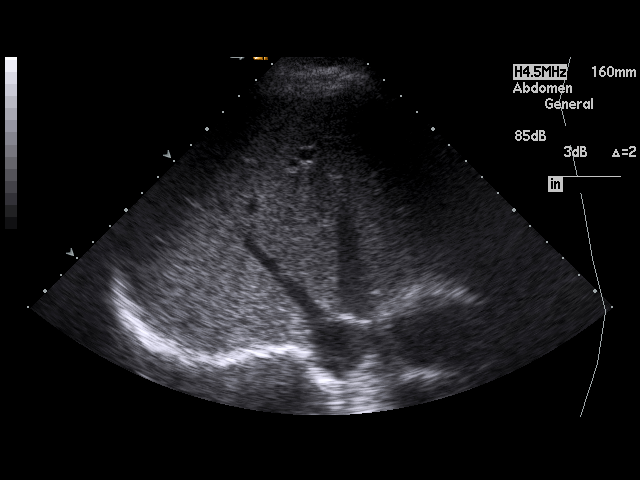
[im 32/69]
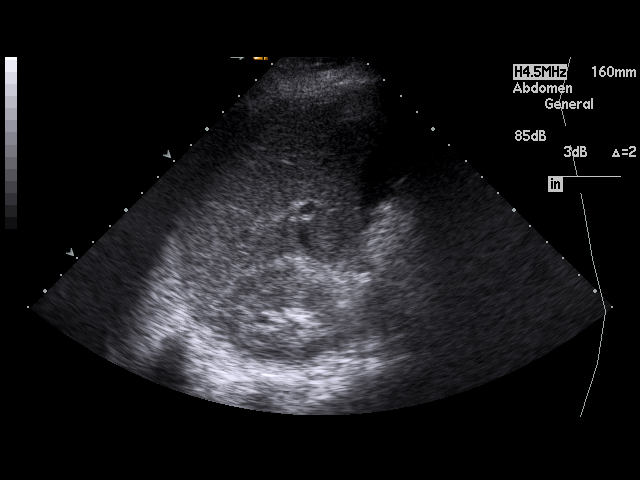
[im 35/69]
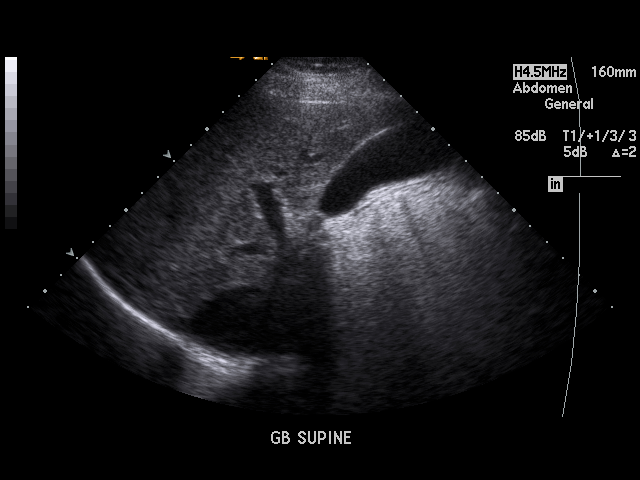
[im 37/69]
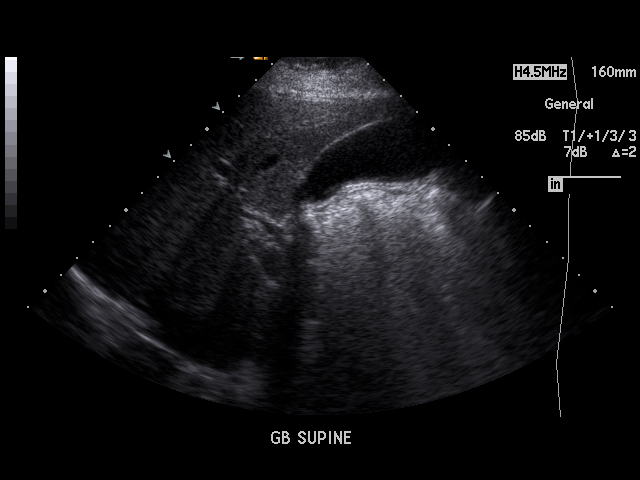
[im 43/69]
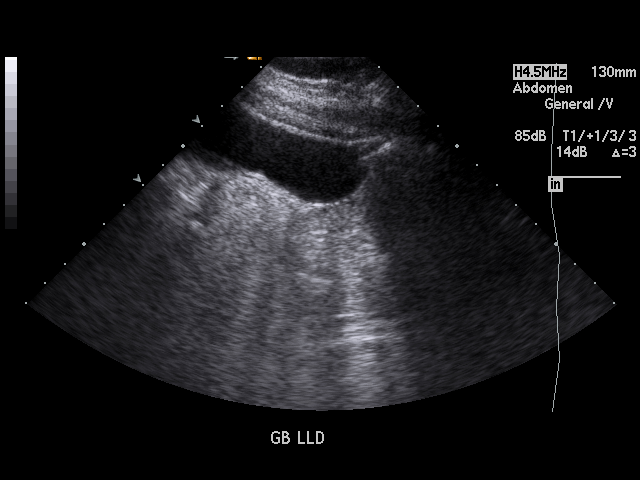
[im 46/69]
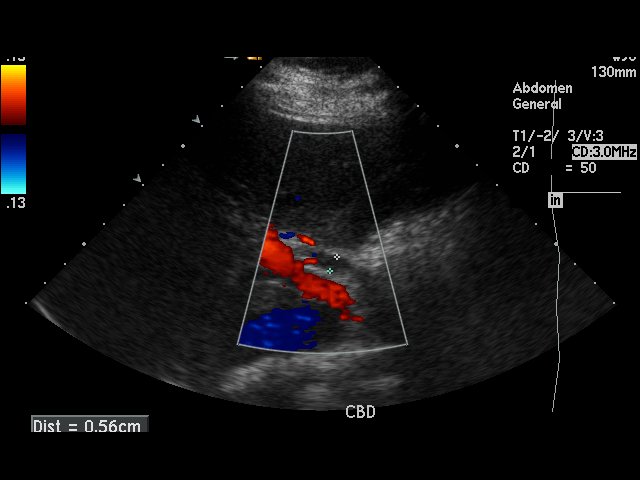
[im 52/69]
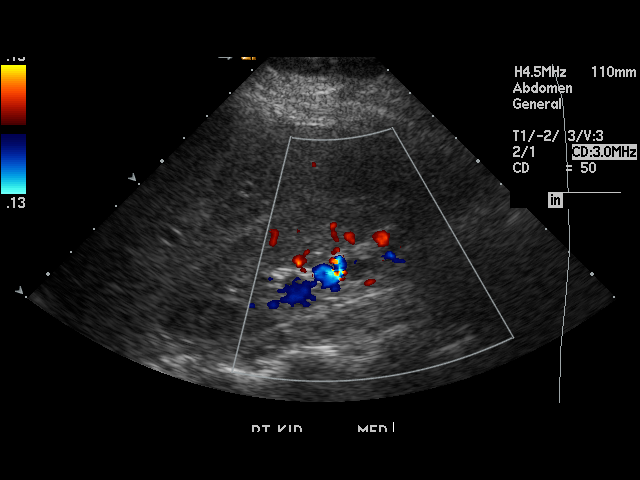
[im 54/69]
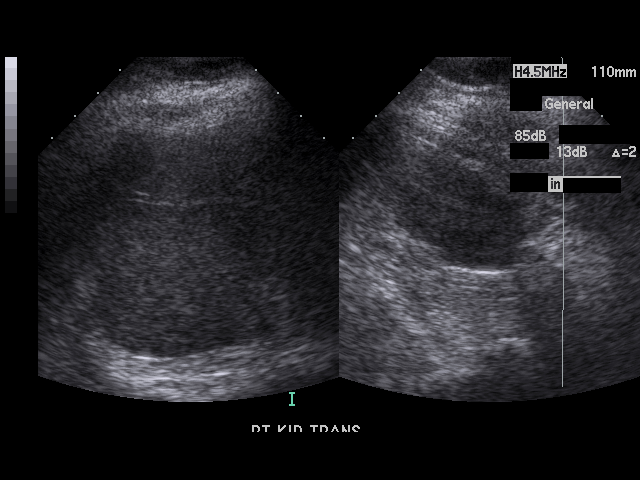
[im 60/69]
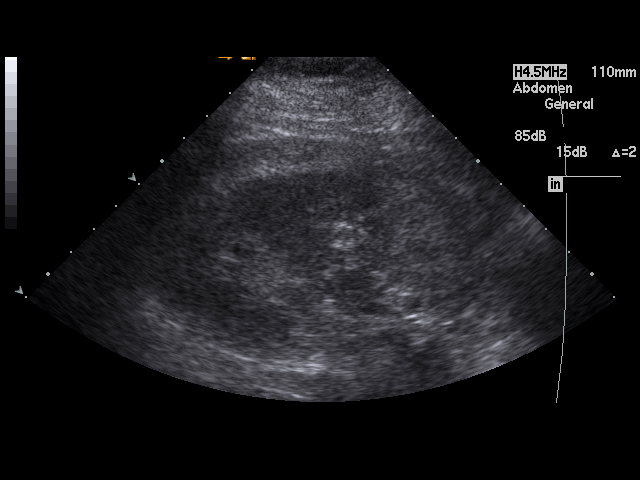
[im 63/69]
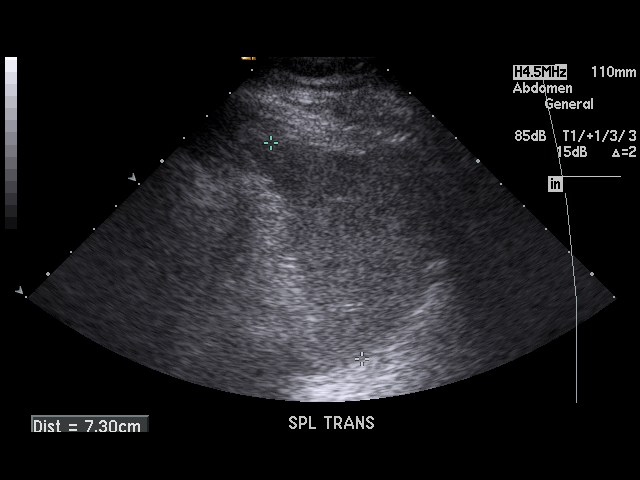
[im 69/69]
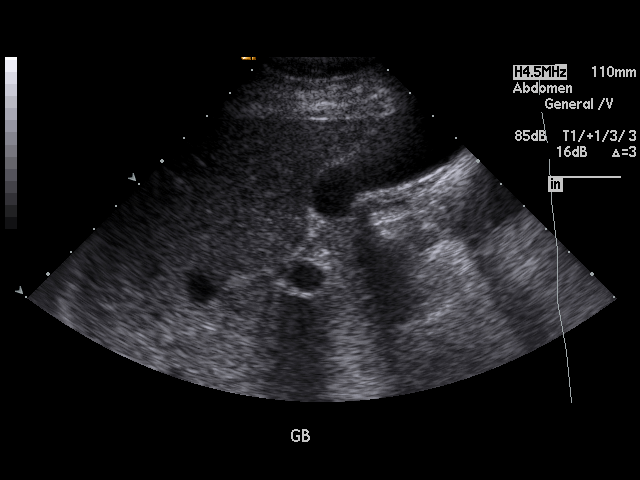

[17 of 25 positions shown; findings below may reference images not displayed]

PROCEDURE:     US  - US ABDOMEN GENERAL SURVEY  - [DATE]  [DATE]

RESULT:     The liver, spleen, pancreas and abdominal aorta are normal in
appearance. No gallstones are seen.  No thickening of the gallbladder wall.
The common bile duct measures 5.6 mm in diameter which is within normal
limits. The kidneys show no hydronephrosis. There is no ascites.
IMPRESSION: No significant abnormalities are noted.

## 2007-05-03 ENCOUNTER — Ambulatory Visit: Payer: Self-pay | Admitting: Gastroenterology

## 2007-05-04 ENCOUNTER — Ambulatory Visit: Payer: Self-pay | Admitting: Internal Medicine

## 2008-02-24 ENCOUNTER — Emergency Department: Payer: Self-pay | Admitting: Emergency Medicine

## 2008-06-23 ENCOUNTER — Emergency Department: Payer: Self-pay | Admitting: Emergency Medicine

## 2009-05-07 ENCOUNTER — Emergency Department: Payer: Self-pay | Admitting: Emergency Medicine

## 2009-07-03 ENCOUNTER — Emergency Department: Payer: Self-pay

## 2009-07-03 IMAGING — CT CT HEAD WITHOUT CONTRAST
2 series · 16 of 30 positions shown, 20 images · non-contrast
Comparison: none

REASON FOR EXAM: 3 day hx of severe headache and dizziness.
COMMENTS:

[Series 2: without · axial · non-contrast · 0.42mm/px · z∈[-168,-48]mm · 13 of 28 slices shown, 17 images]
[im 2/28  brain]
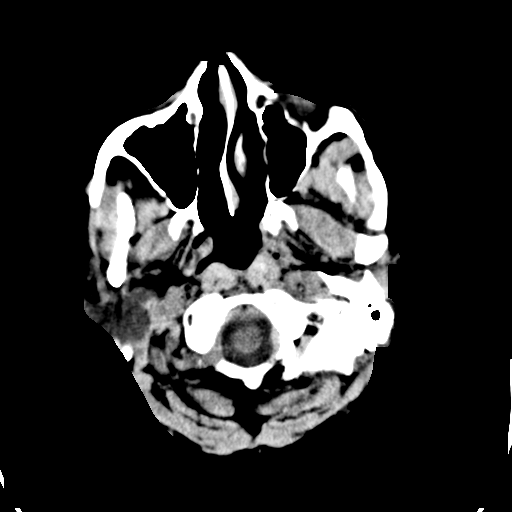
[im 2/28  bone]
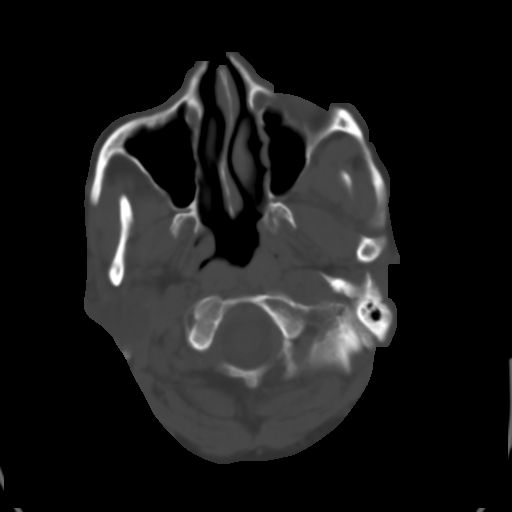
[im 4/28  brain]
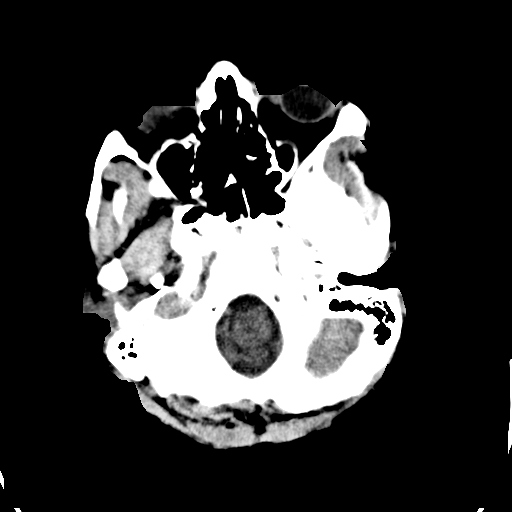
[im 6/28  brain]
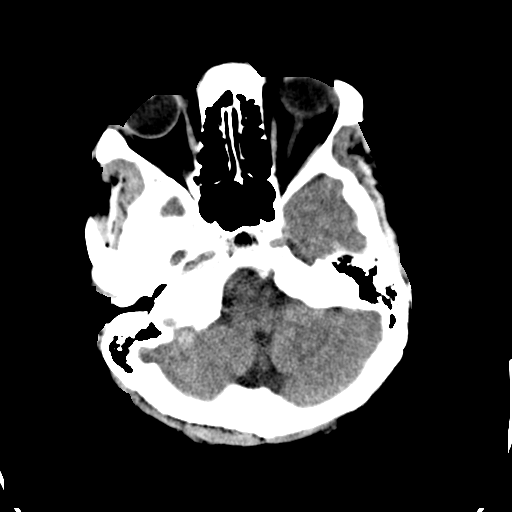
[im 8/28  brain]
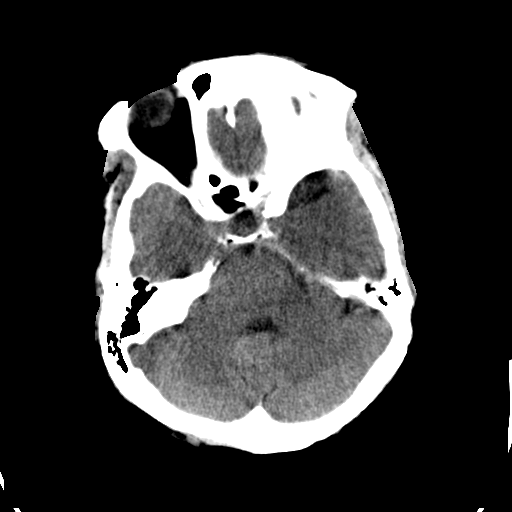
[im 10/28  brain]
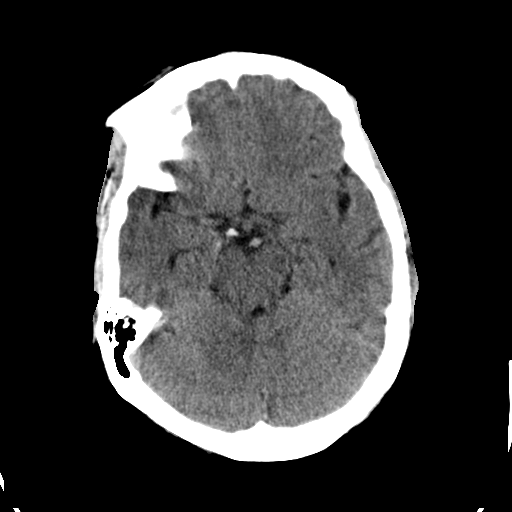
[im 10/28  bone]
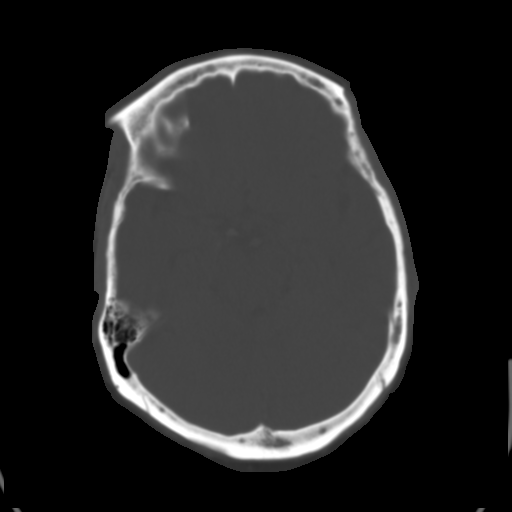
[im 12/28  brain]
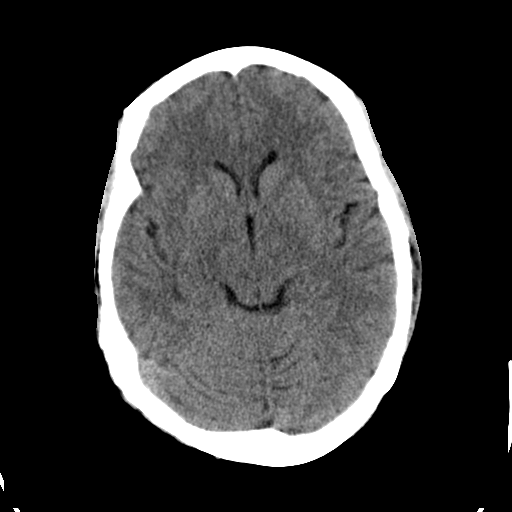
[im 14/28  brain]
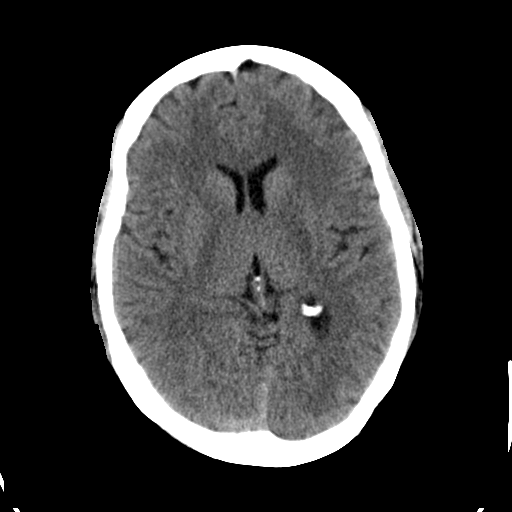
[im 16/28  brain]
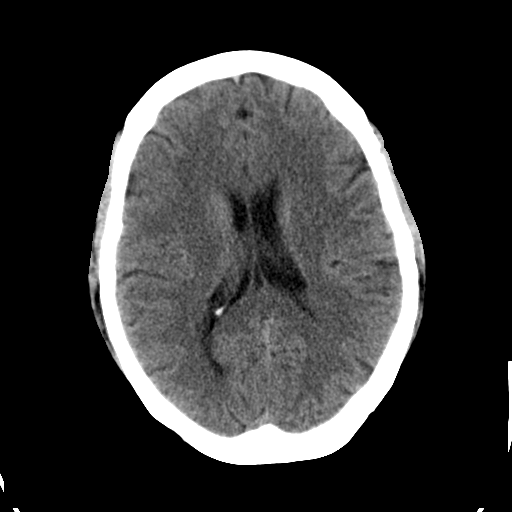
[im 18/28  brain]
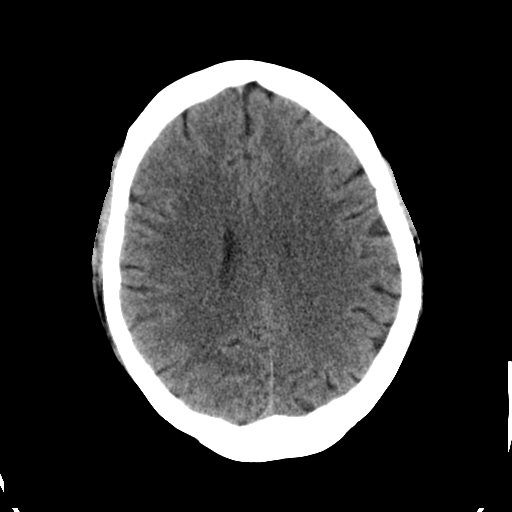
[im 18/28  bone]
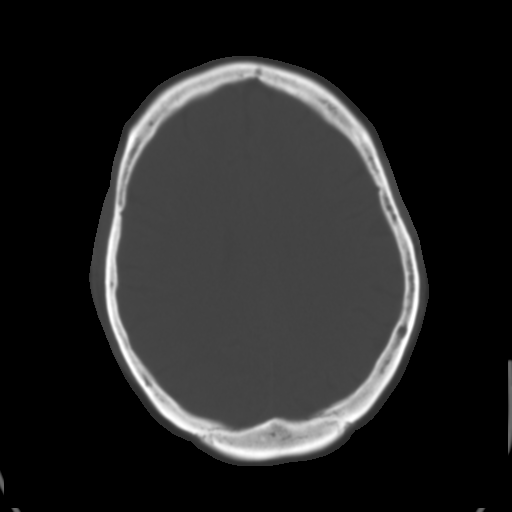
[im 20/28  brain]
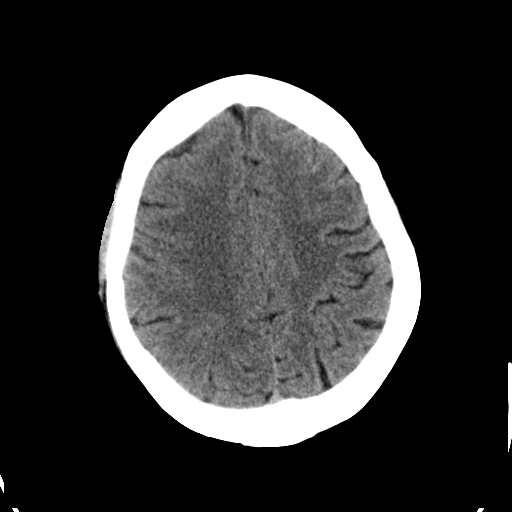
[im 22/28  brain]
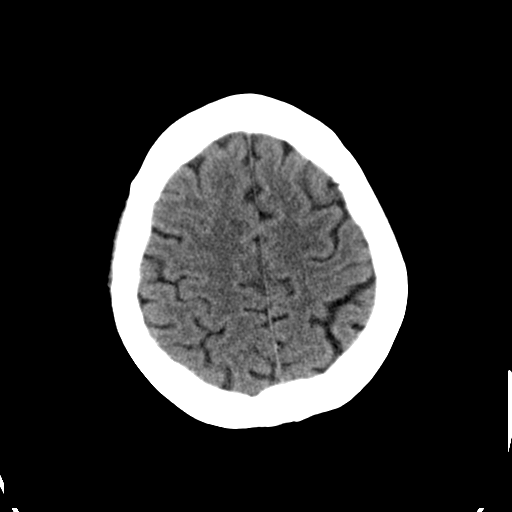
[im 24/28  brain]
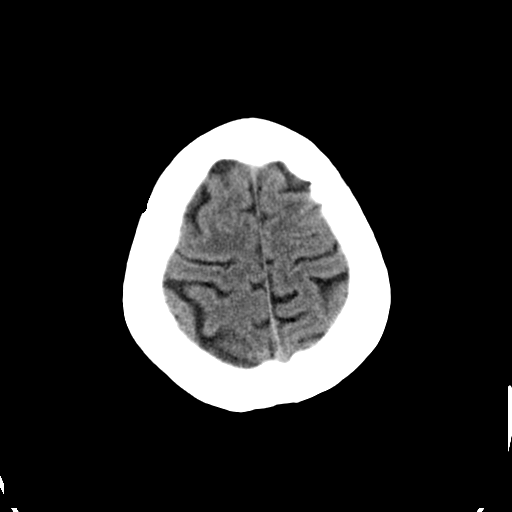
[im 26/28  brain]
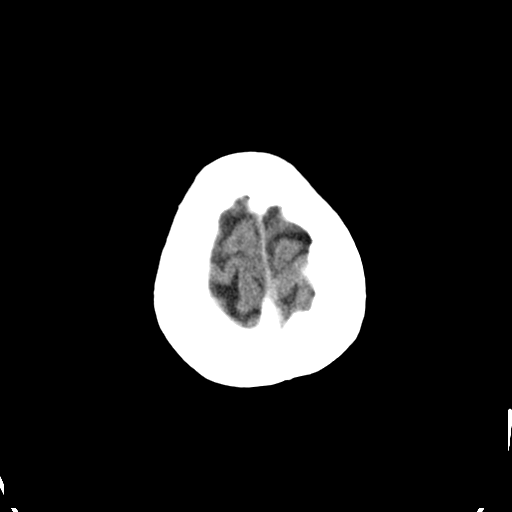
[im 26/28  bone]
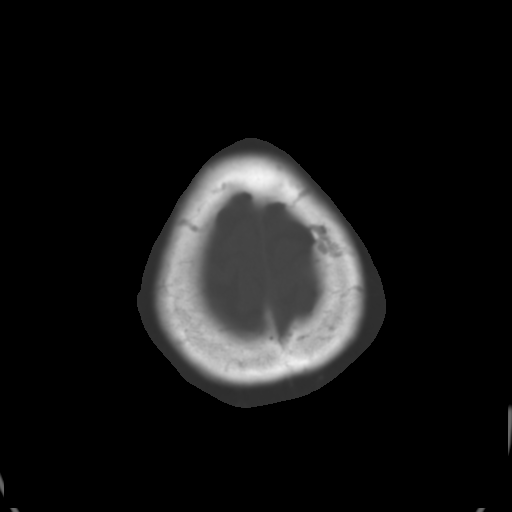

[Series 3: bone · axial · 0.42mm/px · z∈[-168,-128]mm · 3 of 28 slices shown]
[im 2/28  bone]
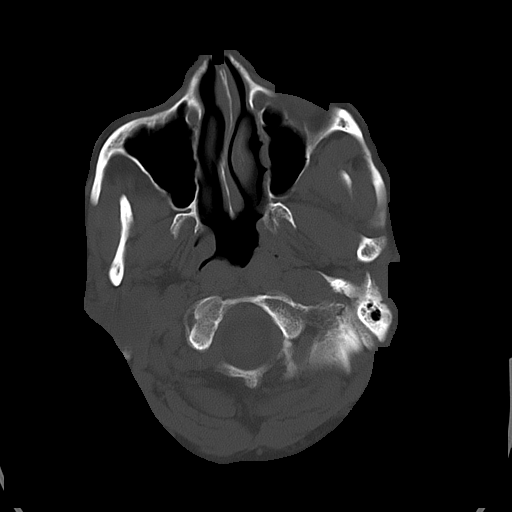
[im 6/28  bone]
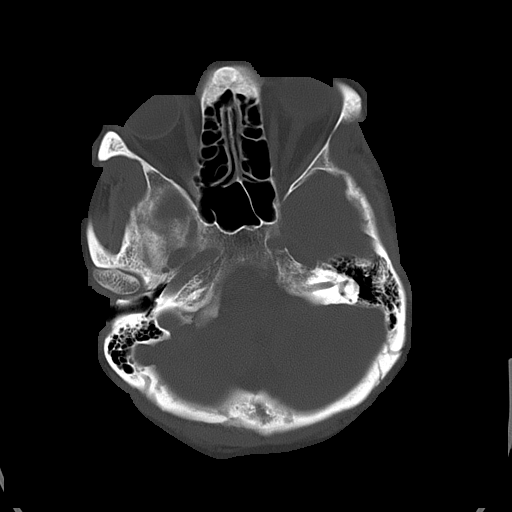
[im 10/28  bone]
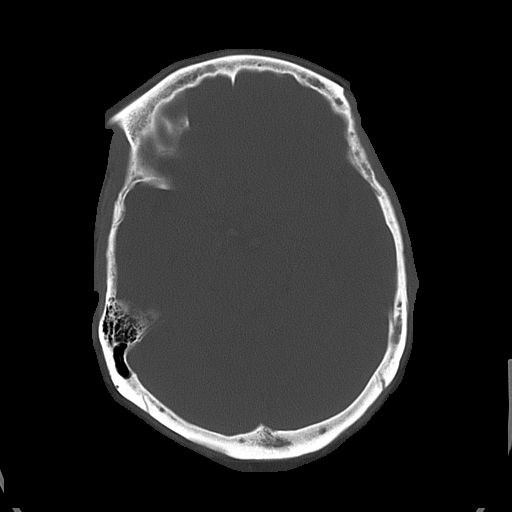

[16 of 30 positions shown; findings below may reference images not displayed]

PROCEDURE:     CT  - CT HEAD WITHOUT CONTRAST  - [DATE]  [DATE]

RESULT:     History: Headaches

Procedure and Findings: Standard nonenhanced head CT obtained and compared
to prior study of [QU]. No acute intracranial abnormalities noted. No mass
lesion or hemorrhage. Lucencies noted in the skull. These are stable from
prior study of [QU]. Although metastatic disease and myeloma could present
this fashion as can anemias,these findings are stable and may well be benign.
IMPRESSION: No acute abnormality identified. Stable head CT from [DATE].

## 2009-07-20 ENCOUNTER — Emergency Department: Payer: Self-pay | Admitting: Emergency Medicine

## 2010-05-22 ENCOUNTER — Emergency Department: Payer: Self-pay | Admitting: Emergency Medicine

## 2010-05-22 IMAGING — CT CT HEAD WITHOUT CONTRAST
2 series · 16 of 30 positions shown, 20 images · non-contrast
Comparison: none

REASON FOR EXAM: facial numbness
COMMENTS:

[Series 2: without · axial · non-contrast · 0.43mm/px · z∈[-652,-532]mm · 13 of 30 slices shown, 17 images]
[im 3/30  brain]
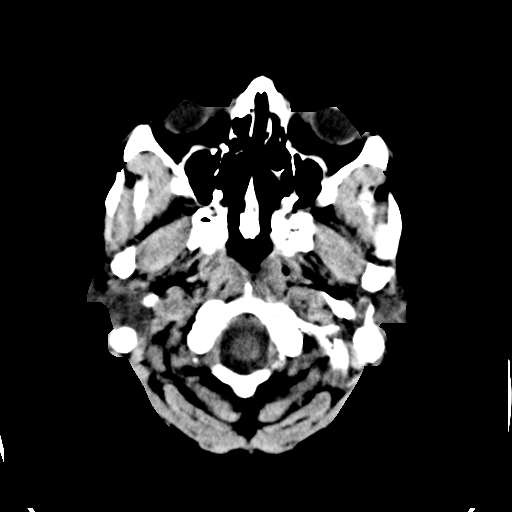
[im 3/30  bone]
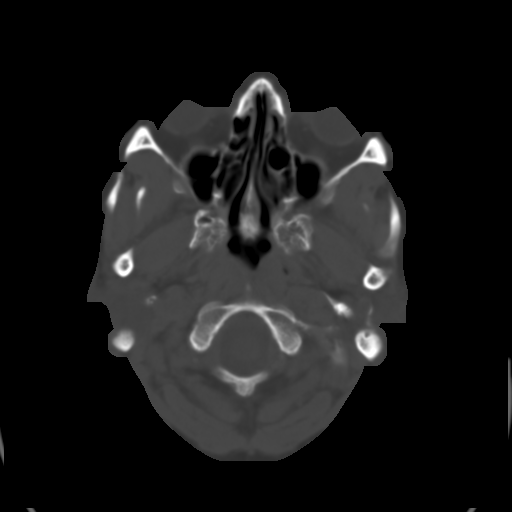
[im 5/30  brain]
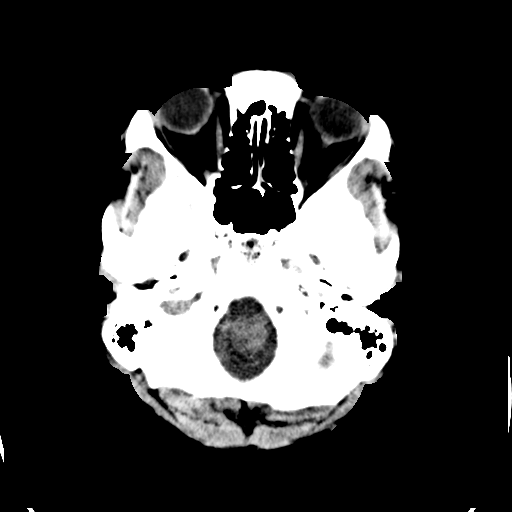
[im 7/30  brain]
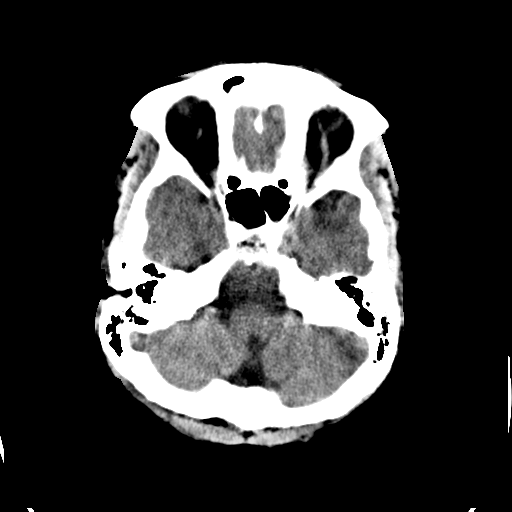
[im 9/30  brain]
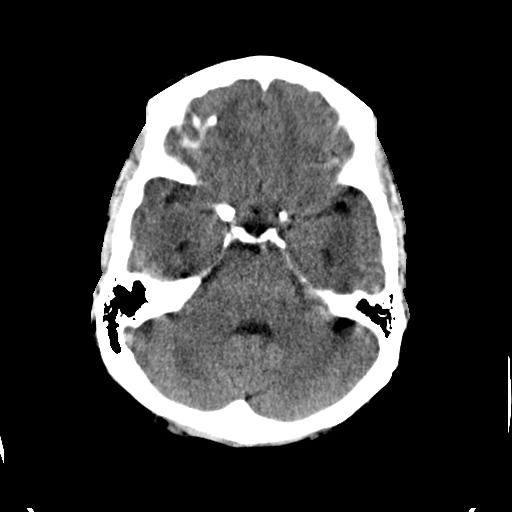
[im 11/30  brain]
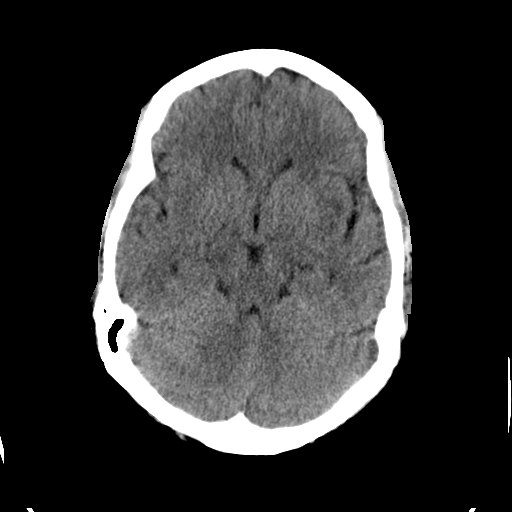
[im 11/30  bone]
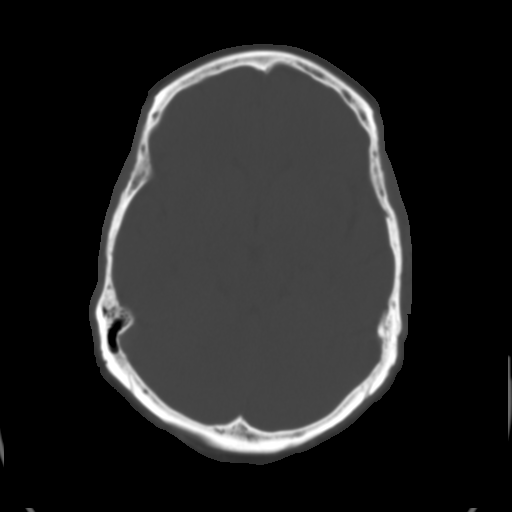
[im 13/30  brain]
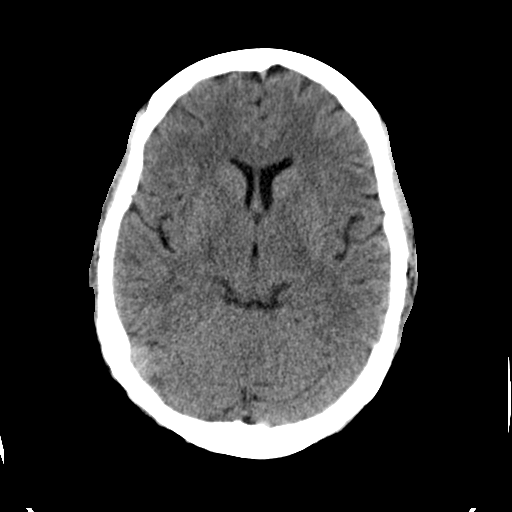
[im 15/30  brain]
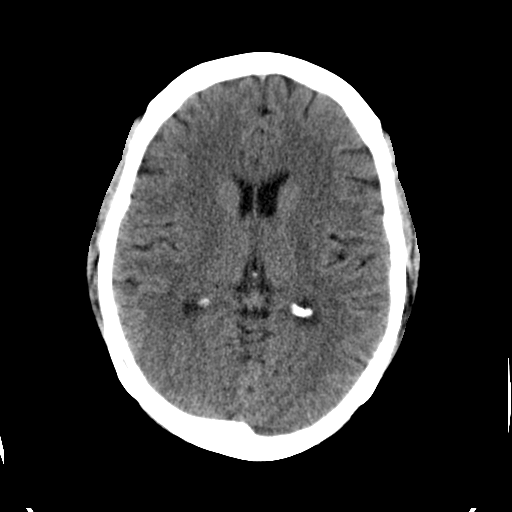
[im 17/30  brain]
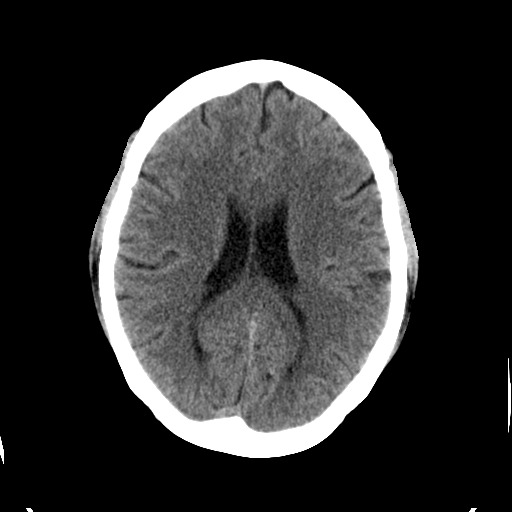
[im 19/30  brain]
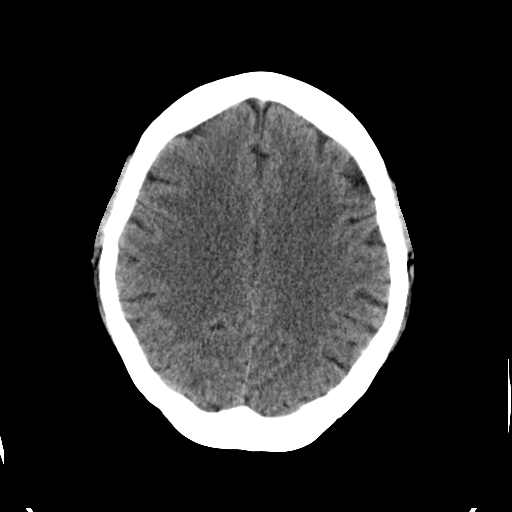
[im 19/30  bone]
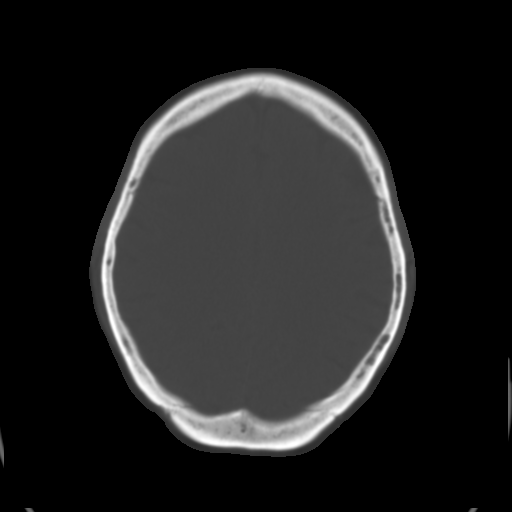
[im 21/30  brain]
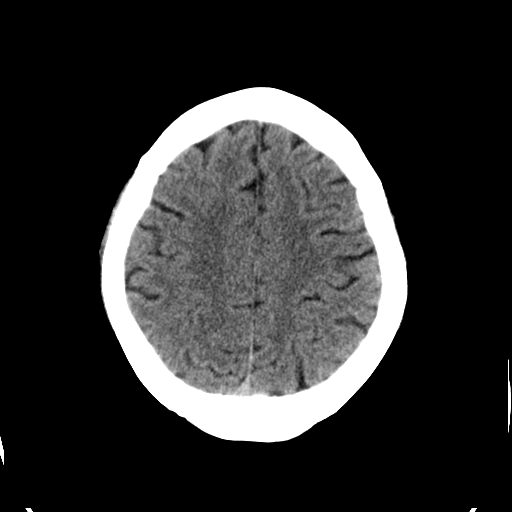
[im 23/30  brain]
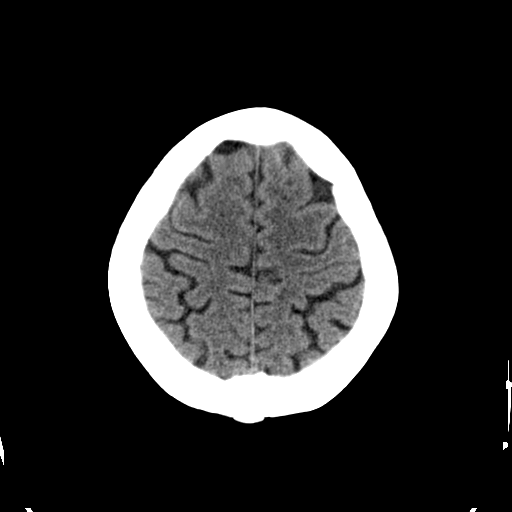
[im 25/30  brain]
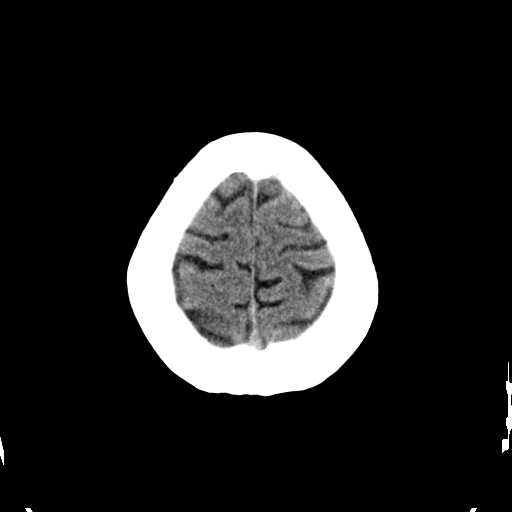
[im 27/30  brain]
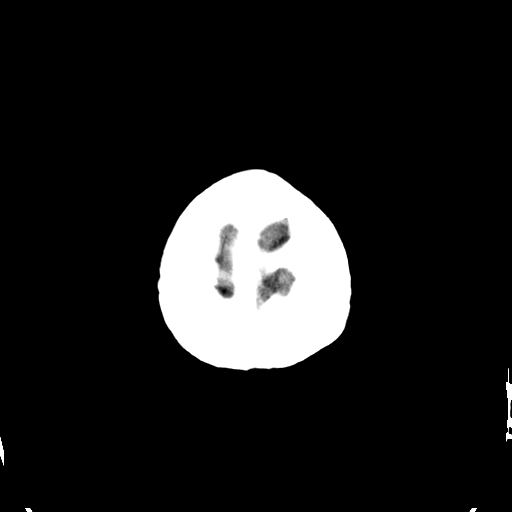
[im 27/30  bone]
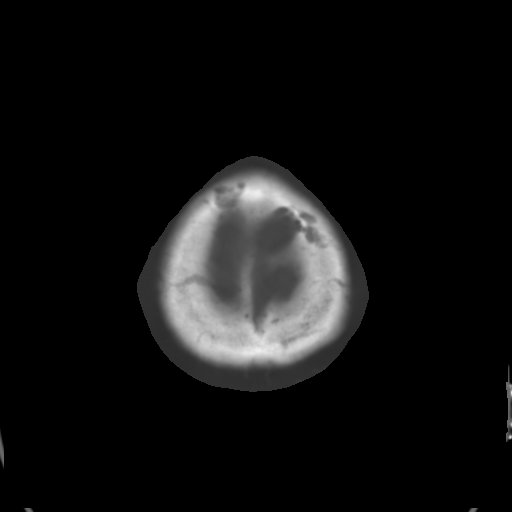

[Series 3: bone · axial · 0.43mm/px · z∈[-652,-612]mm · 3 of 30 slices shown]
[im 3/30  bone]
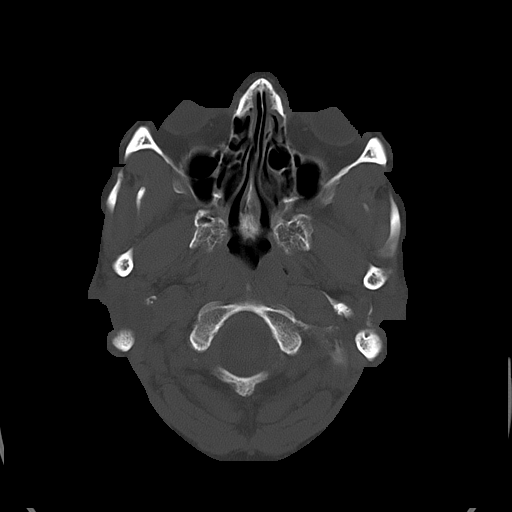
[im 7/30  bone]
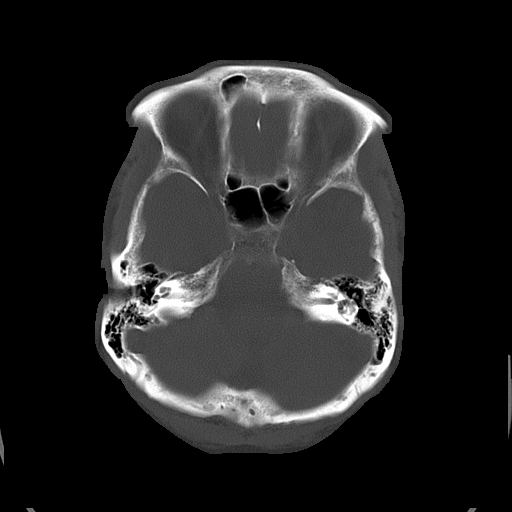
[im 11/30  bone]
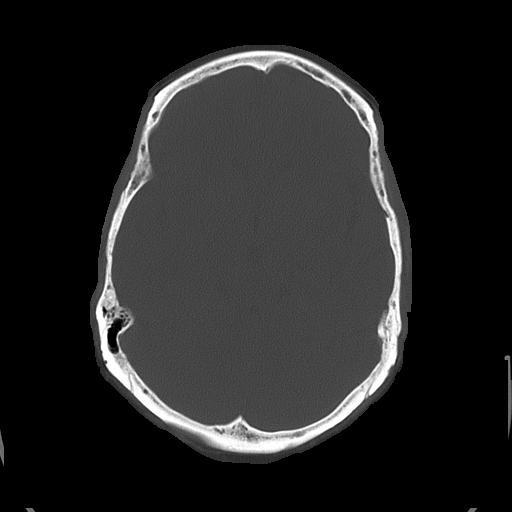

[16 of 30 positions shown; findings below may reference images not displayed]

PROCEDURE:     CT  - CT HEAD WITHOUT CONTRAST  - [DATE] [DATE]

RESULT:     Noncontrast emergent CT of the brain is compared to previous
images of [DATE] and [DATE].

The ventricles and sulci are normal. There is no hemorrhage. There is no
focal mass, mass-effect or midline shift. There is no evidence of edema or
territorial infarct. The bone windows demonstrate normal aeration of the
paranasal sinuses and mastoid air cells. There is no skull fracture
demonstrated.
IMPRESSION: No acute intracranial abnormality. No interval change evident.

## 2010-05-22 IMAGING — CR DG CHEST 1V PORT
1 series · 1 of 1 positions shown · non-contrast
Comparison: none

REASON FOR EXAM: palpatations
COMMENTS:

[view not recorded]
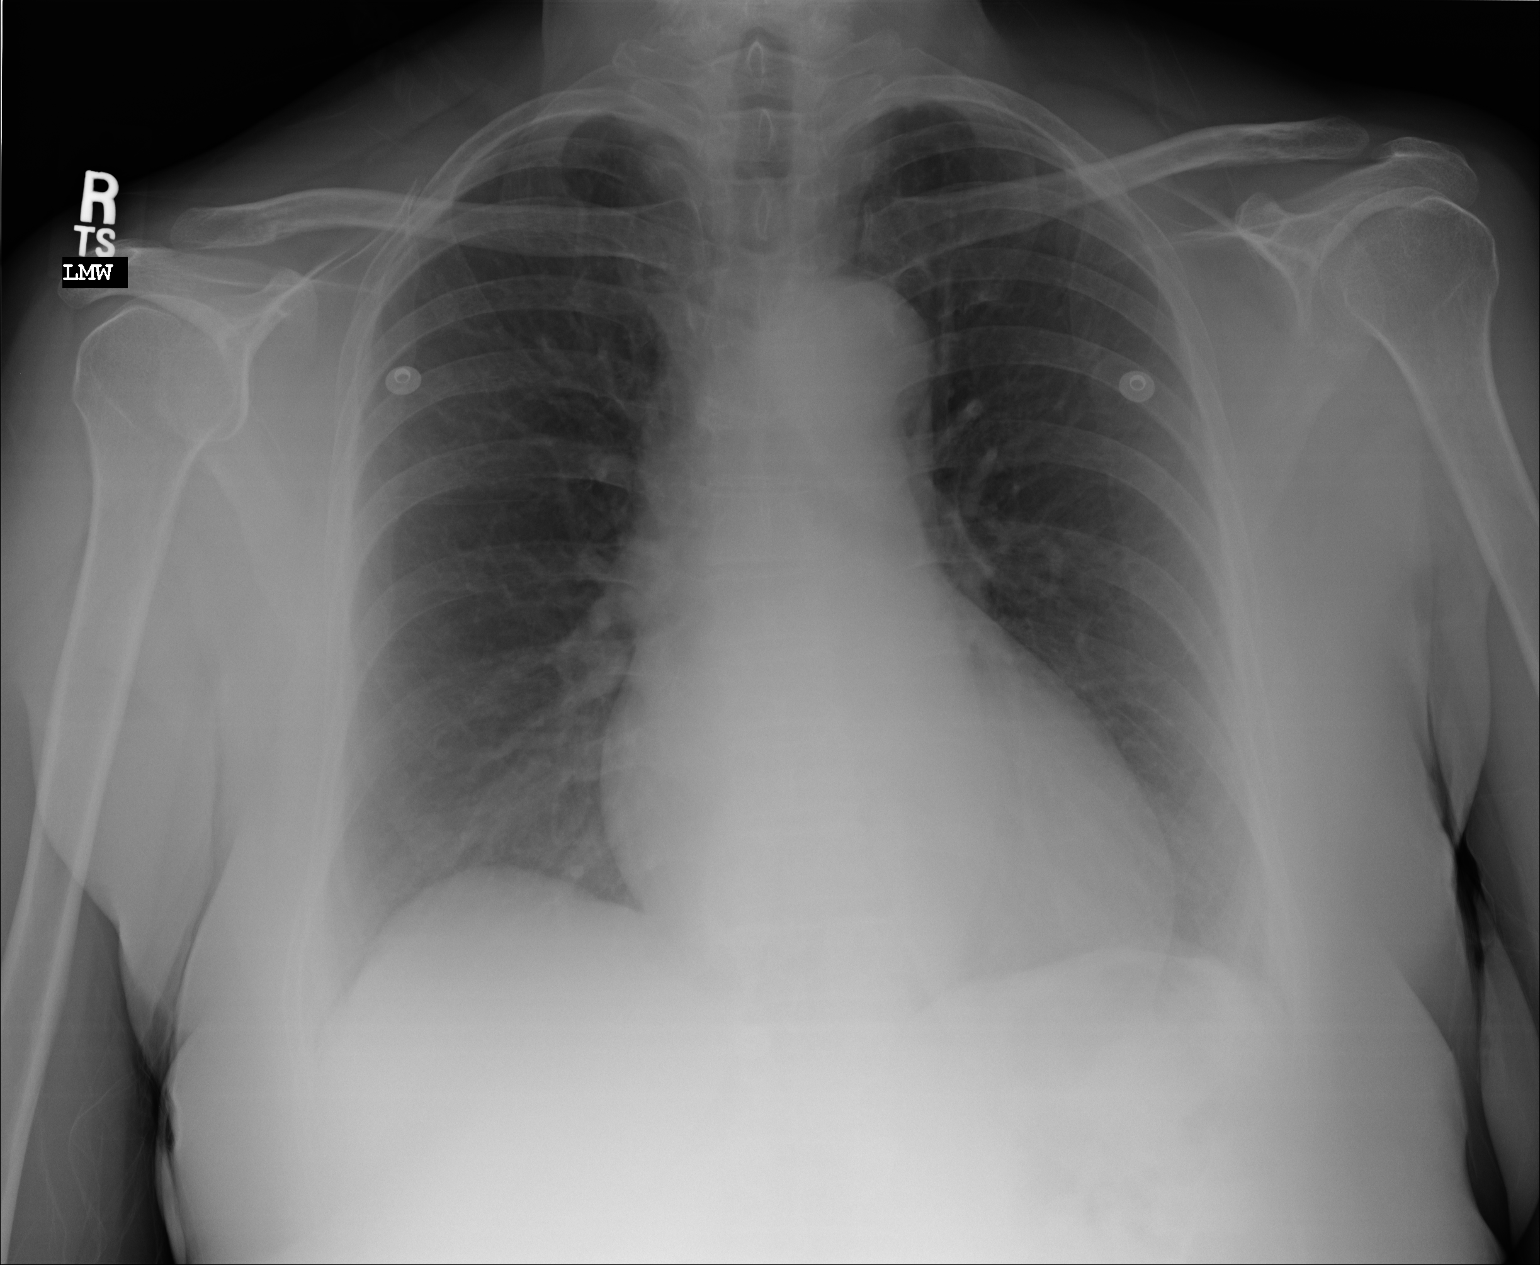

[1 of 1 positions shown; findings below may reference images not displayed]

PROCEDURE:     DXR - DXR PORTABLE CHEST SINGLE VIEW  - [DATE] [DATE]

RESULT:     Comparison is made to the study of [DATE].

The lungs are clear. The heart and pulmonary vessels are normal. The bony
and mediastinal structures are unremarkable. There is no effusion. There is
no pneumothorax or evidence of congestive failure.
IMPRESSION: No acute cardiopulmonary disease.

## 2010-05-23 IMAGING — US ABDOMEN ULTRASOUND
1 series · 17 of 25 positions shown · non-contrast
Comparison: none

REASON FOR EXAM: ruq pain
COMMENTS:

[Series 1: abdomen ultrasound · 17 of 56 slices shown]
[im 1/56]
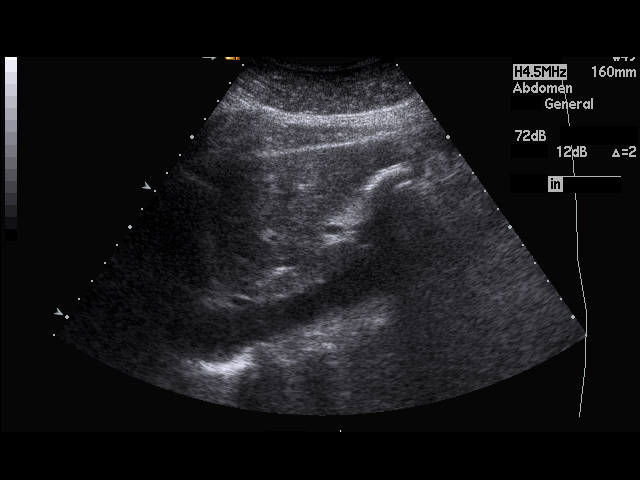
[im 5/56]
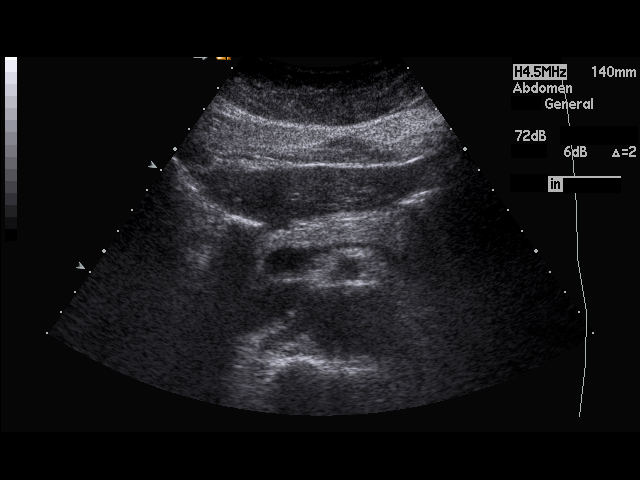
[im 7/56]
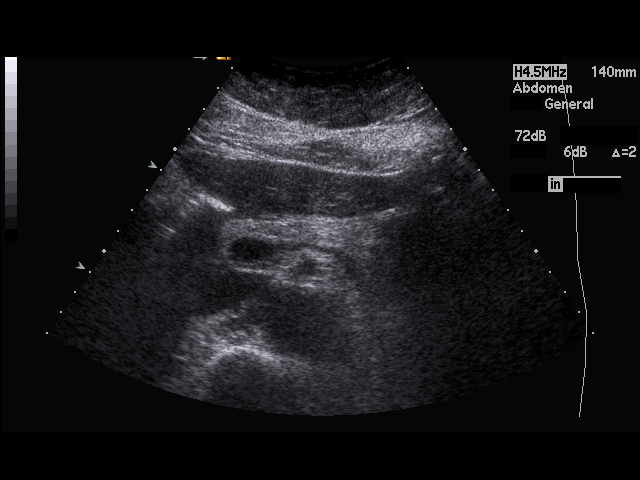
[im 12/56]
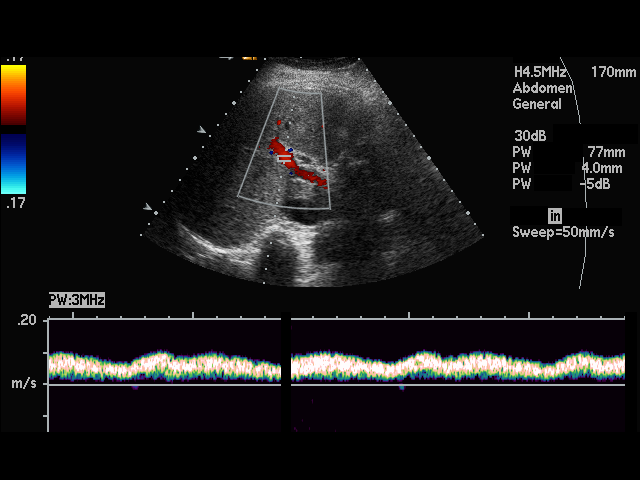
[im 14/56]
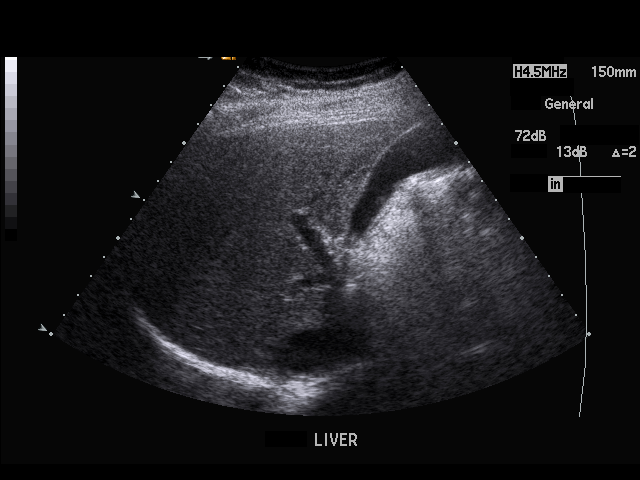
[im 19/56]
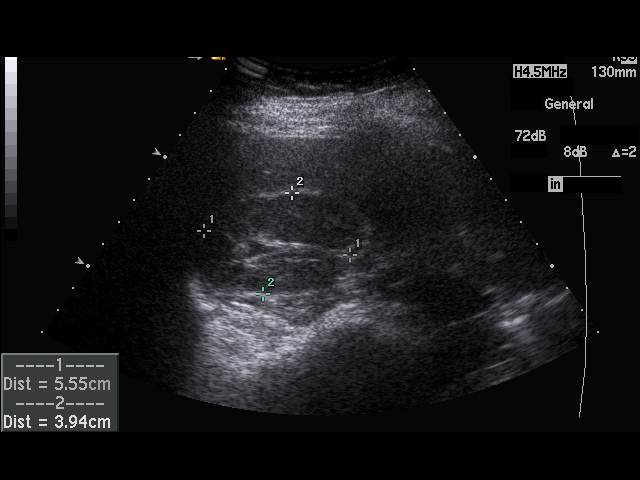
[im 21/56]
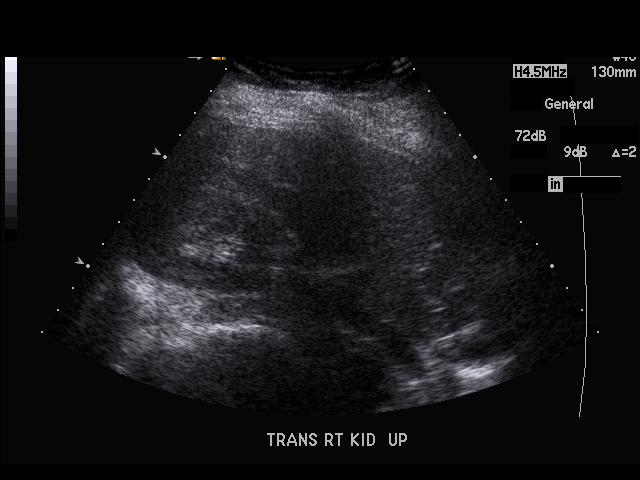
[im 26/56]
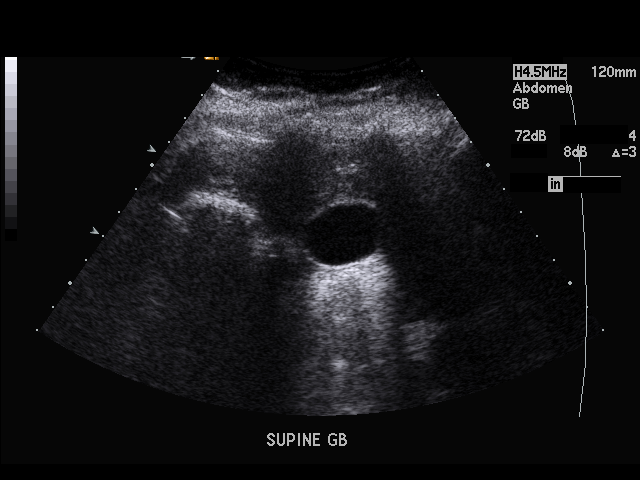
[im 28/56]
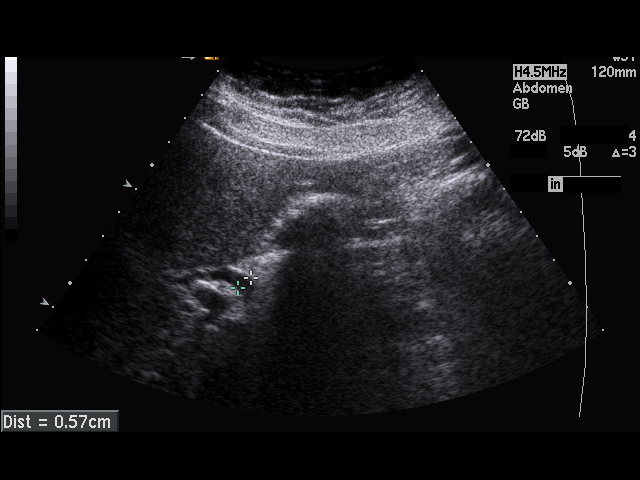
[im 30/56]
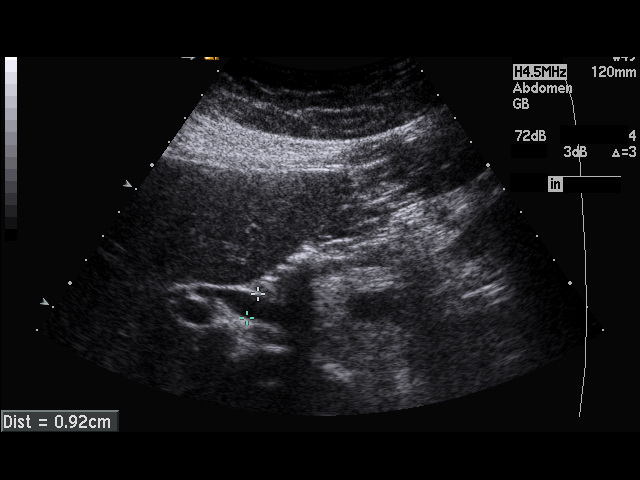
[im 35/56]
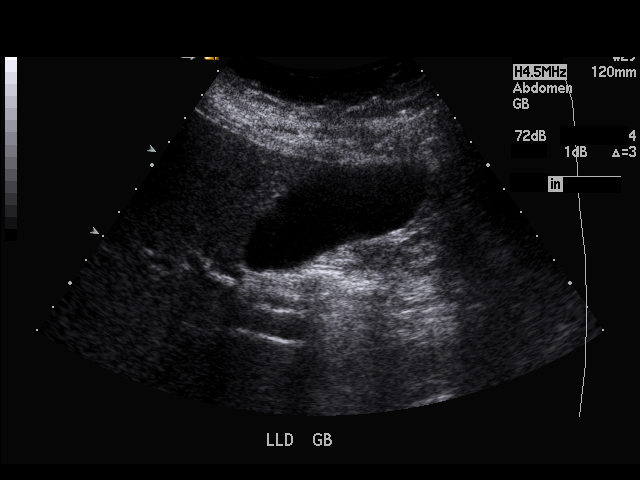
[im 37/56]
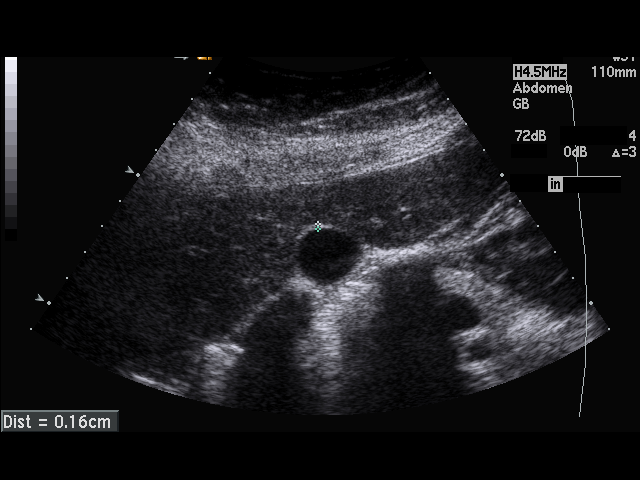
[im 42/56]
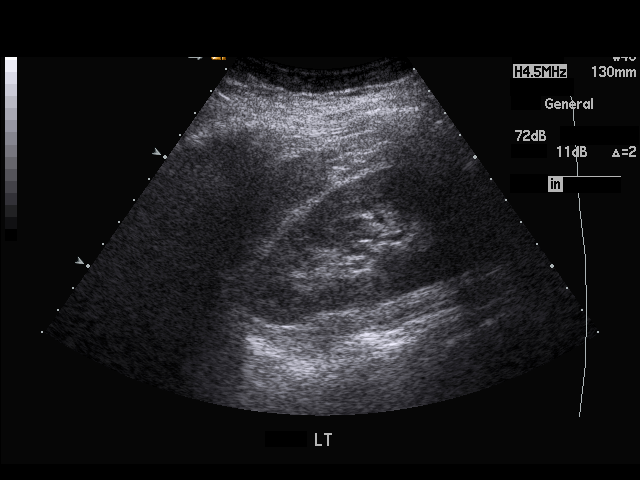
[im 44/56]
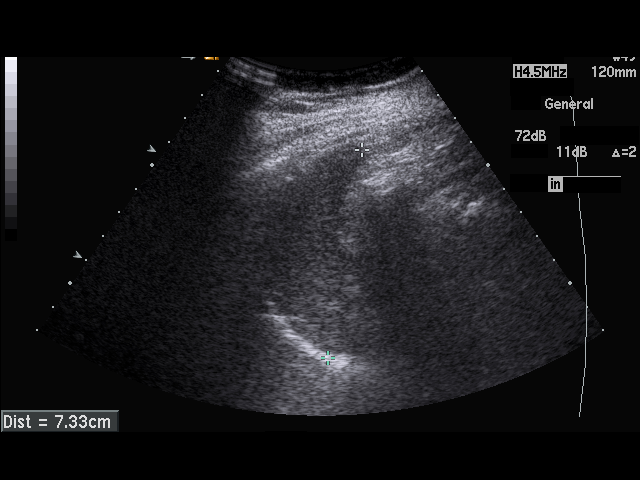
[im 49/56]
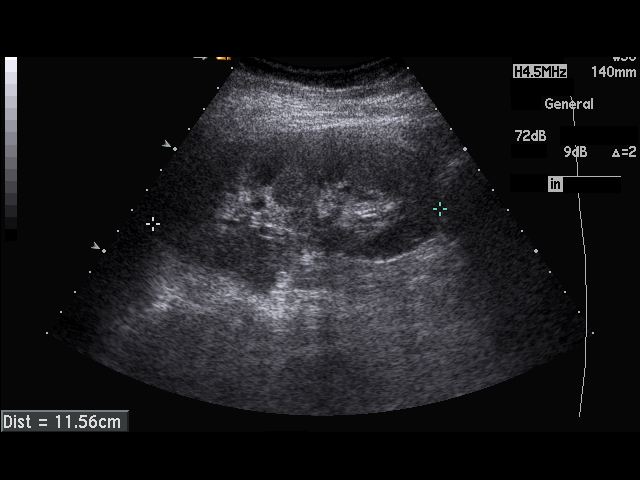
[im 51/56]
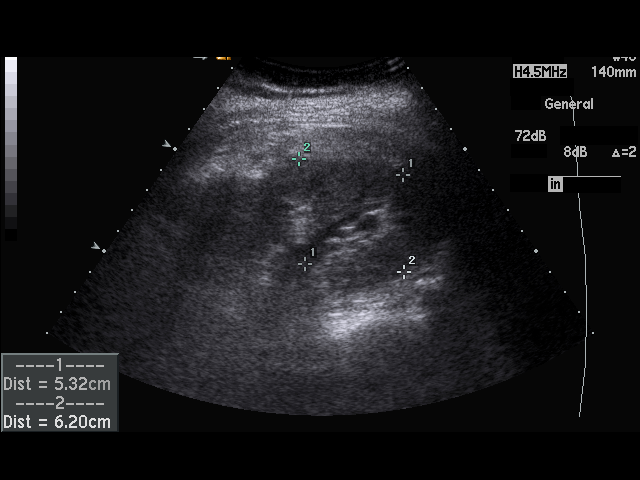
[im 56/56]
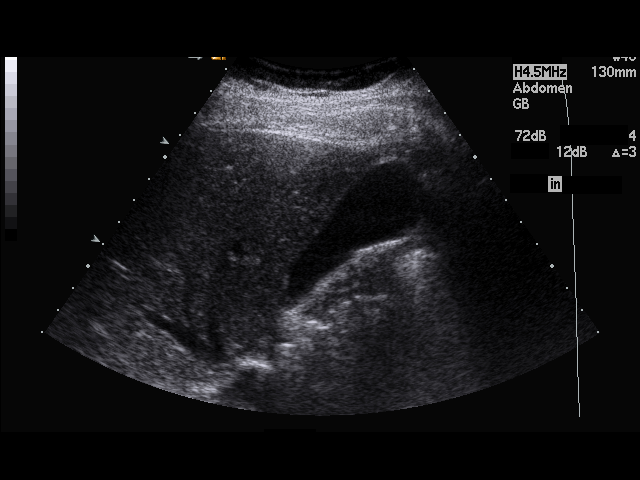

[17 of 25 positions shown; findings below may reference images not displayed]

PROCEDURE:     US  - US ABDOMEN GENERAL SURVEY  - [DATE]  [DATE]

RESULT:     Emergent pelvic sonogram demonstrates the inferior vena cava and
abdominal aorta appear unremarkable. The visualized pancreas is within
normal limits. The portal venous flow is normal. The right kidney,
gallbladder and liver appear unremarkable. The common bile duct is distended
to 5.7 mm. No intrahepatic biliary ductal dilation is present. More distally
the common bile duct measures 9.2 mm. The distal duct at the pancreatic head
is not able to be visualized. There is no free fluid or abnormal fluid
collection. The gallbladder wall thickness is 1.6 mm. The left kidney and
spleen appear unremarkable.
IMPRESSION: Abnormal distention of the common bile duct. Previously, the common bile
duct measured 5.6 mm in [DATE]. Further investigation with LFTs and
consideration for M.R.C.P. or ERCP is recommended.

## 2010-12-01 ENCOUNTER — Emergency Department: Payer: Self-pay | Admitting: Emergency Medicine

## 2010-12-01 IMAGING — CT CT HEAD WITHOUT CONTRAST
2 series · 16 of 30 positions shown, 20 images · non-contrast
Comparison: none

REASON FOR EXAM: swallowing difficulty and slurred speech
COMMENTS:

PROCEDURE:     CT  - CT HEAD WITHOUT CONTRAST  - [DATE]  [DATE]
RESULT:     Technique: Helical 5mm sections were obtained from the skull
base to the vertex without administration of intravenous contrast.

[Series 2: without · axial · non-contrast · 0.39mm/px · z∈[+920,+1040]mm · 13 of 29 slices shown, 17 images]
[im 3/29  brain]
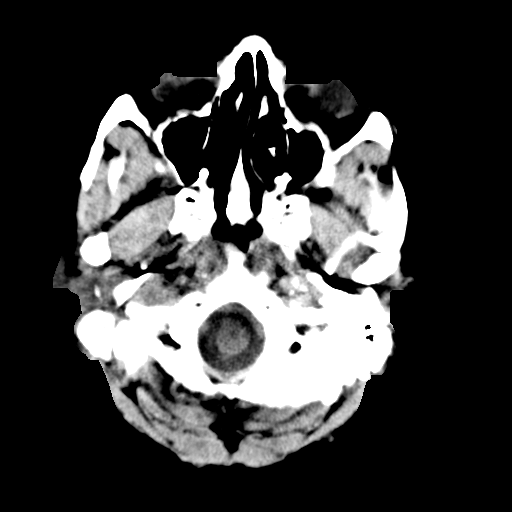
[im 3/29  bone]
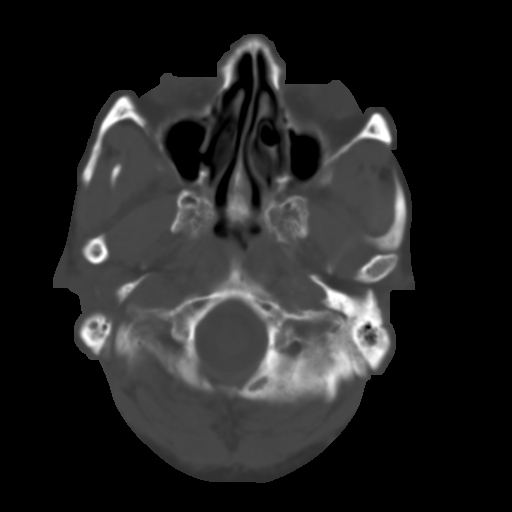
[im 5/29  brain]
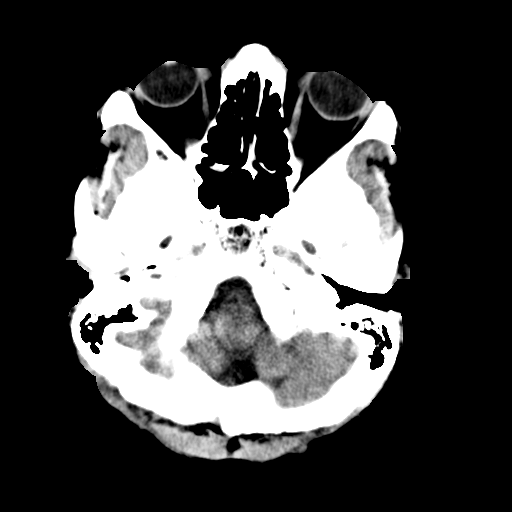
[im 7/29  brain]
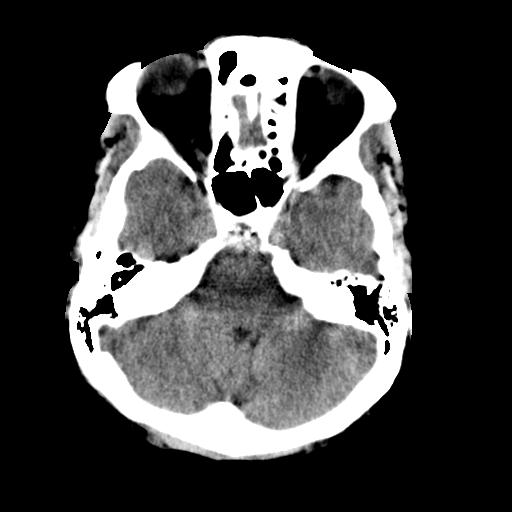
[im 9/29  brain]
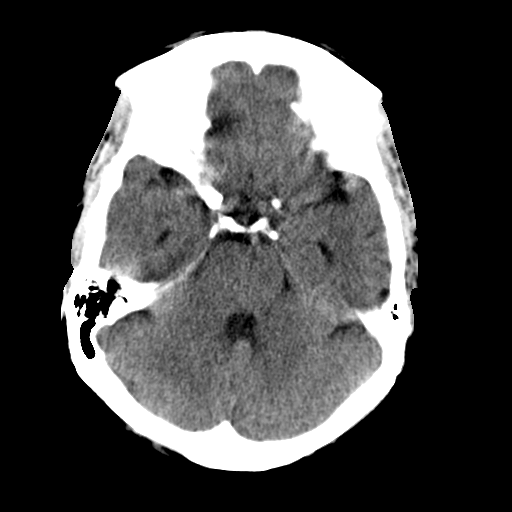
[im 11/29  brain]
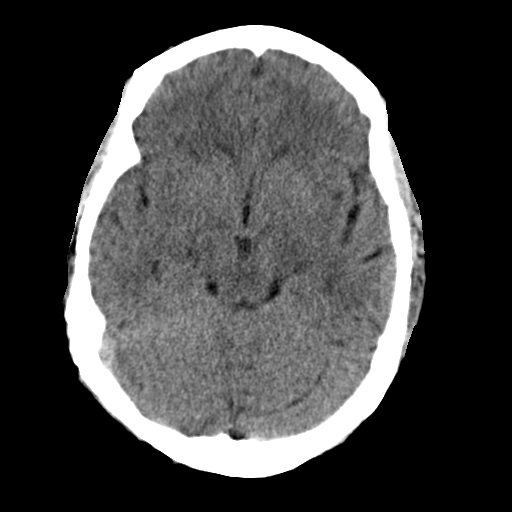
[im 11/29  bone]
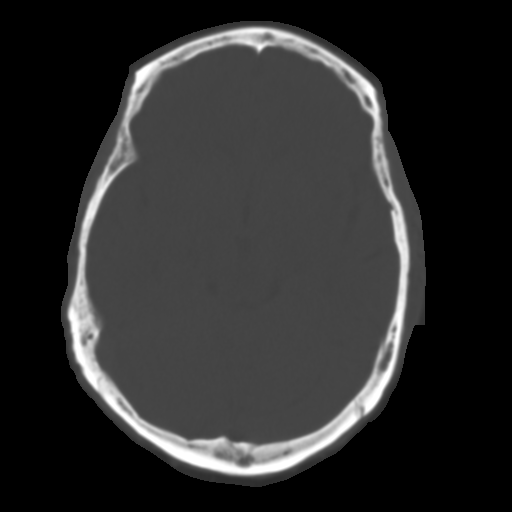
[im 13/29  brain]
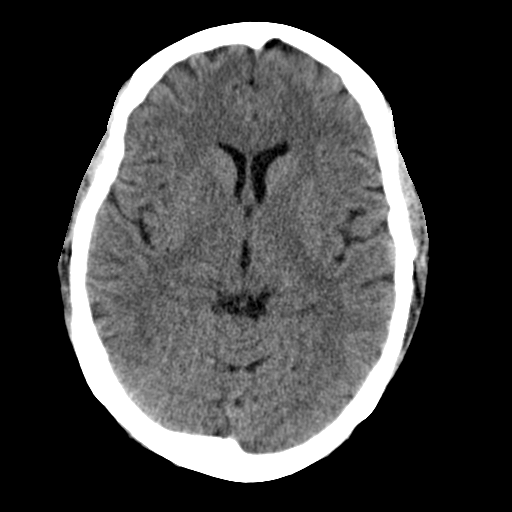
[im 15/29  brain]
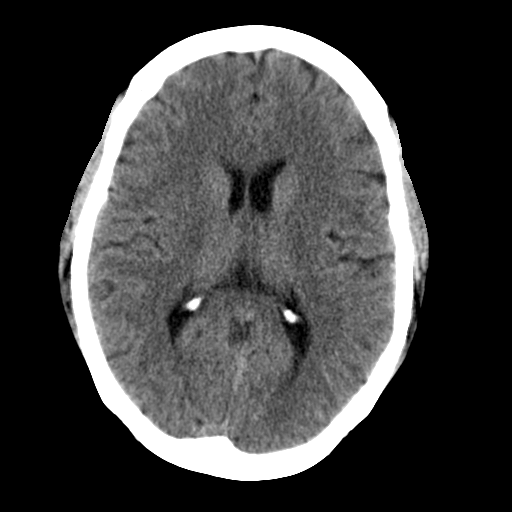
[im 17/29  brain]
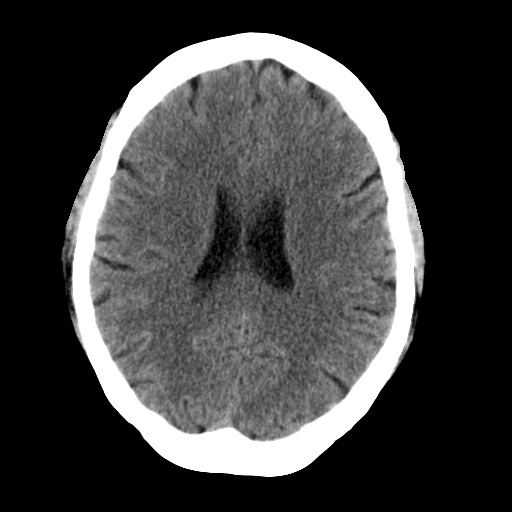
[im 19/29  brain]
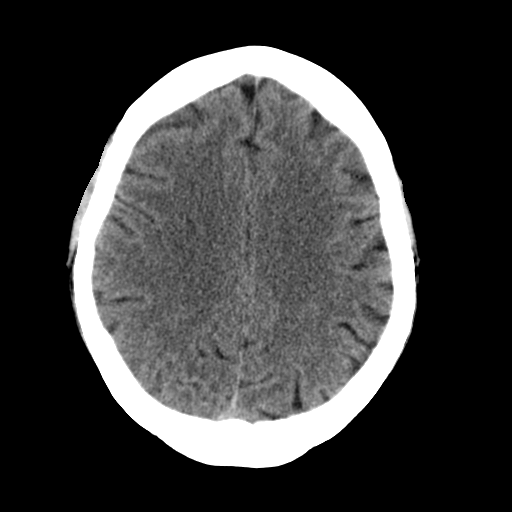
[im 19/29  bone]
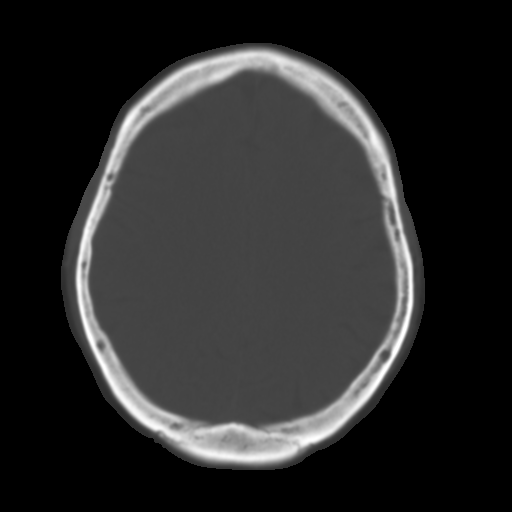
[im 21/29  brain]
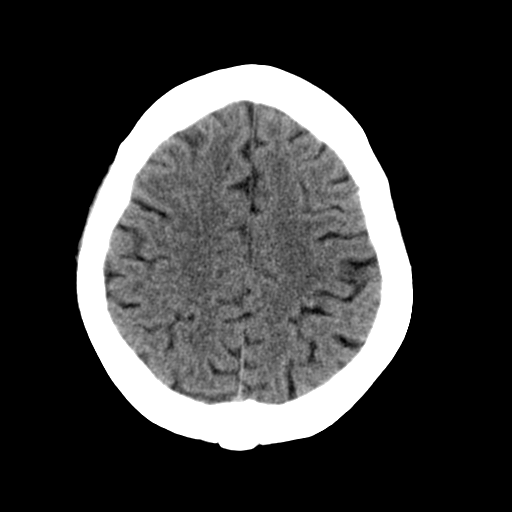
[im 23/29  brain]
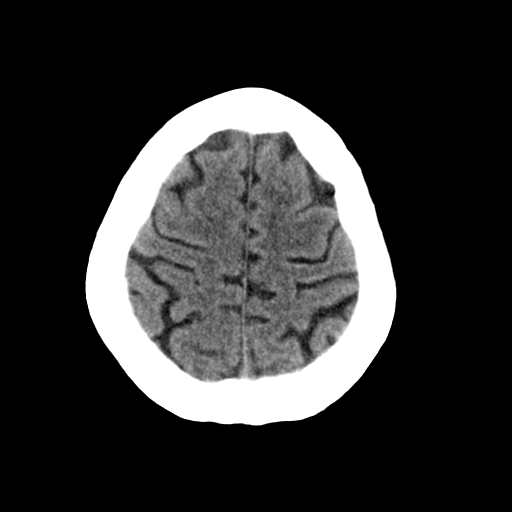
[im 25/29  brain]
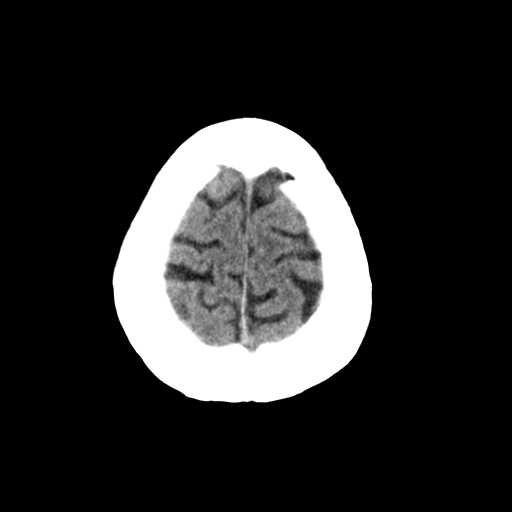
[im 27/29  brain]
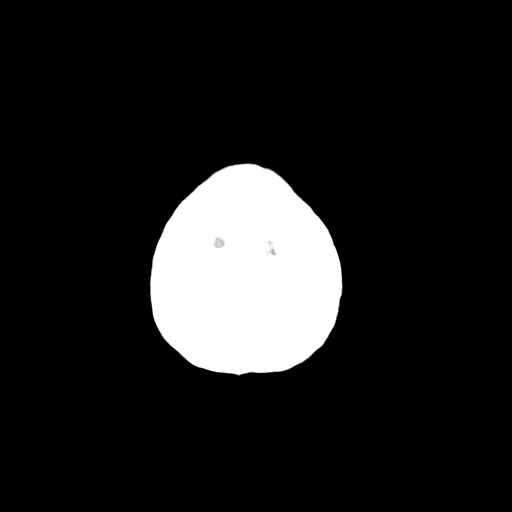
[im 27/29  bone]
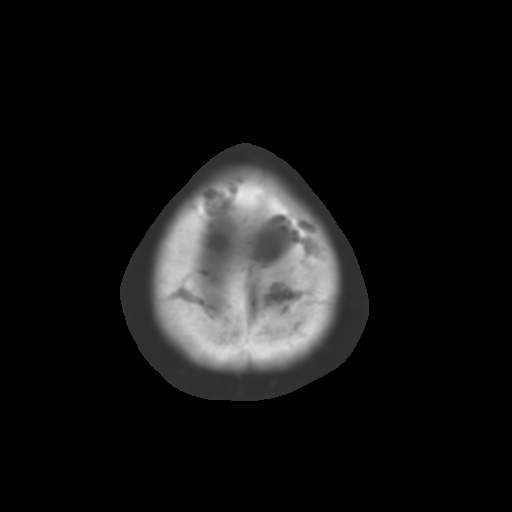

[Series 3: bone · axial · 0.39mm/px · z∈[+920,+960]mm · 3 of 29 slices shown]
[im 3/29  bone]
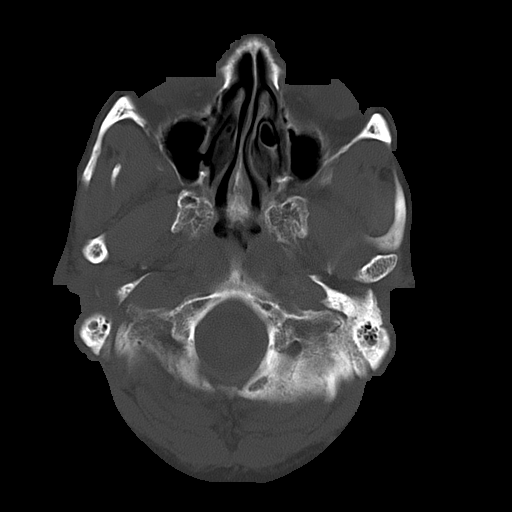
[im 7/29  bone]
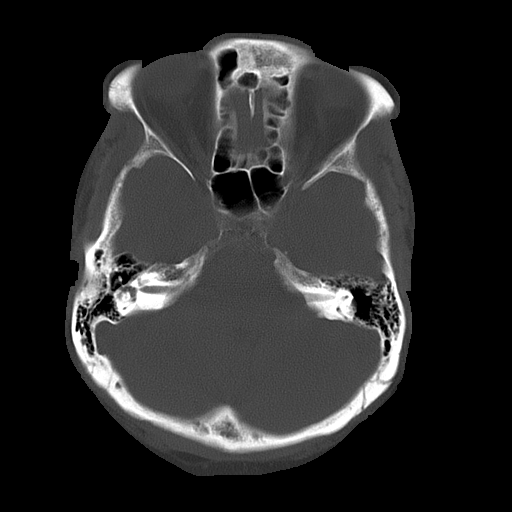
[im 11/29  bone]
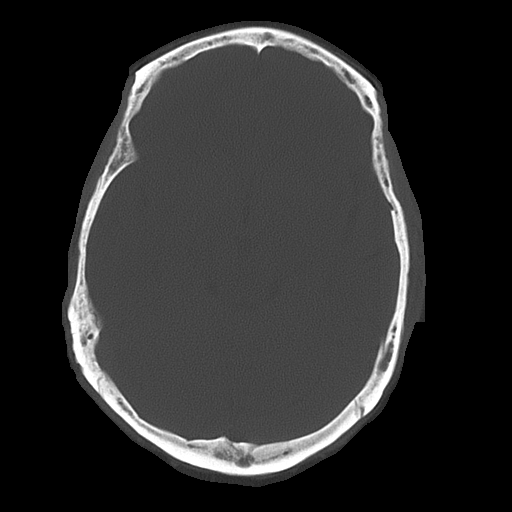

[16 of 30 positions shown; findings below may reference images not displayed]

FINDINGS: There is not evidence of intra-axial fluid collections. There is
no evidence of acute hemorrhage or secondary signs reflecting mass effect or
subacute or chronic focal territorial infarction. The osseous structures
demonstrate no evidence of a depressed skull fracture. If there is
persistent concern clinical follow-up with MRI is recommended.
IMPRESSION: 1. No evidence of acute intracranial abnormalitites.
2. A comparison is made to prior study dated [DATE].

## 2011-07-28 ENCOUNTER — Ambulatory Visit: Payer: Self-pay | Admitting: Nephrology

## 2011-07-28 ENCOUNTER — Ambulatory Visit: Payer: Self-pay

## 2011-07-28 IMAGING — MG MAM DGTL SCRN MAM NO ORDER W/CAD
1 series · 4 of 4 positions shown · non-contrast
Comparison: none

REASON FOR EXAM: scr no order
COMMENTS:

[Series 8456: R CC · right · 4 of 4 slices shown]
[im 1/4]
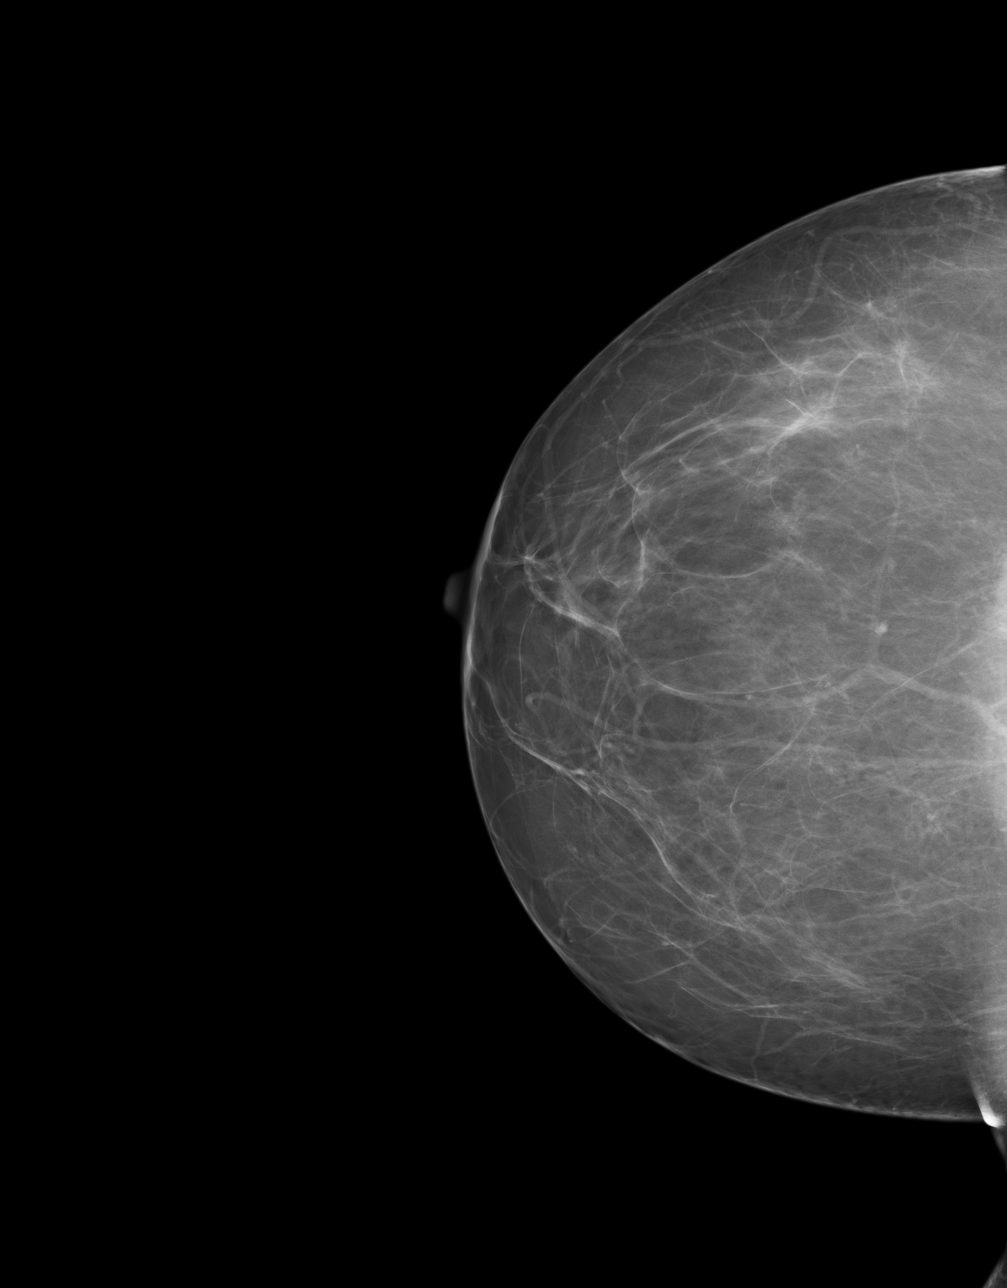
[im 2/4]
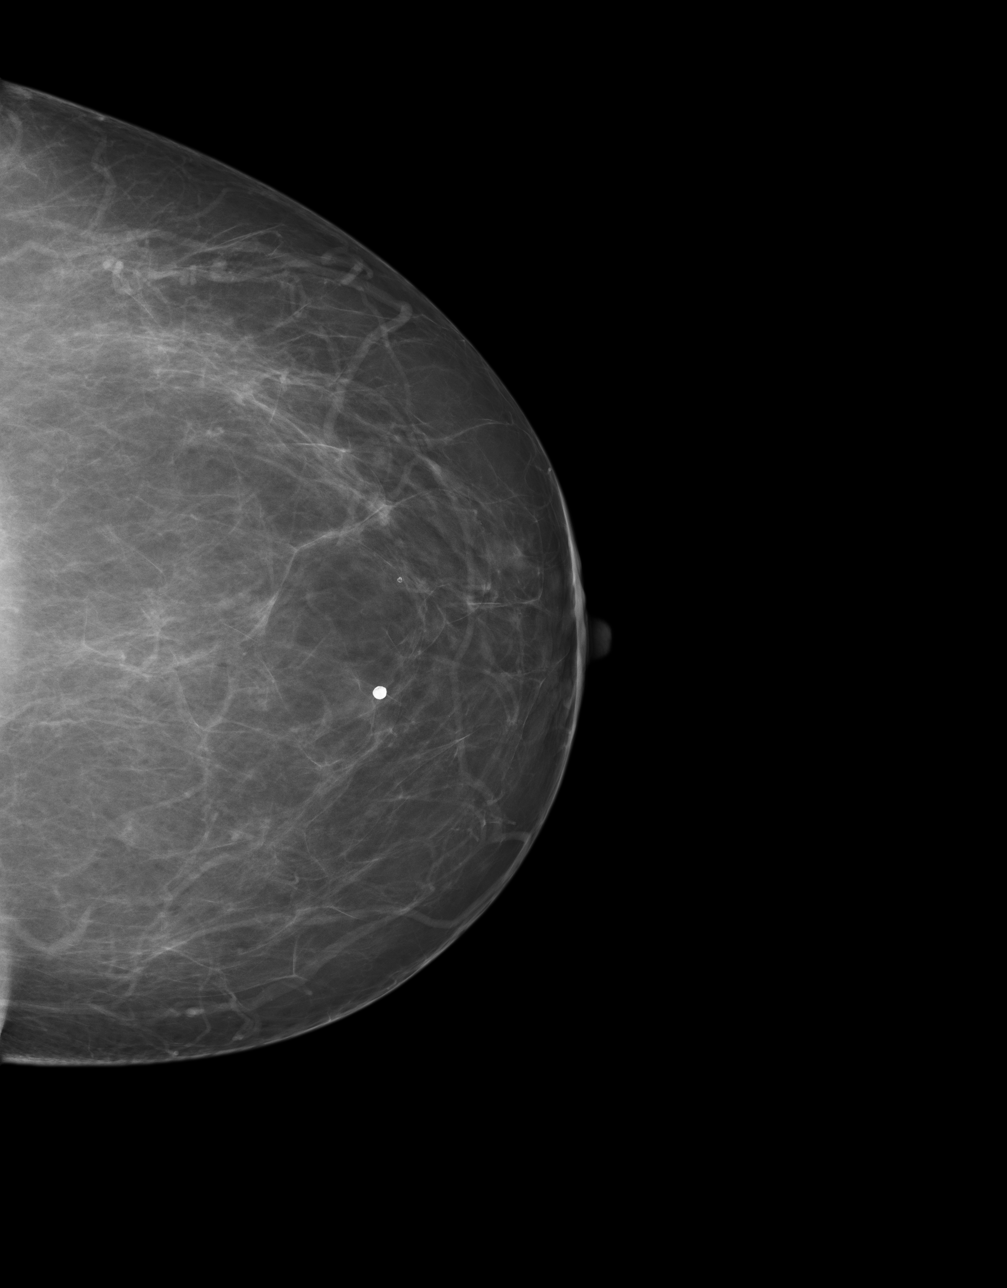
[im 3/4]
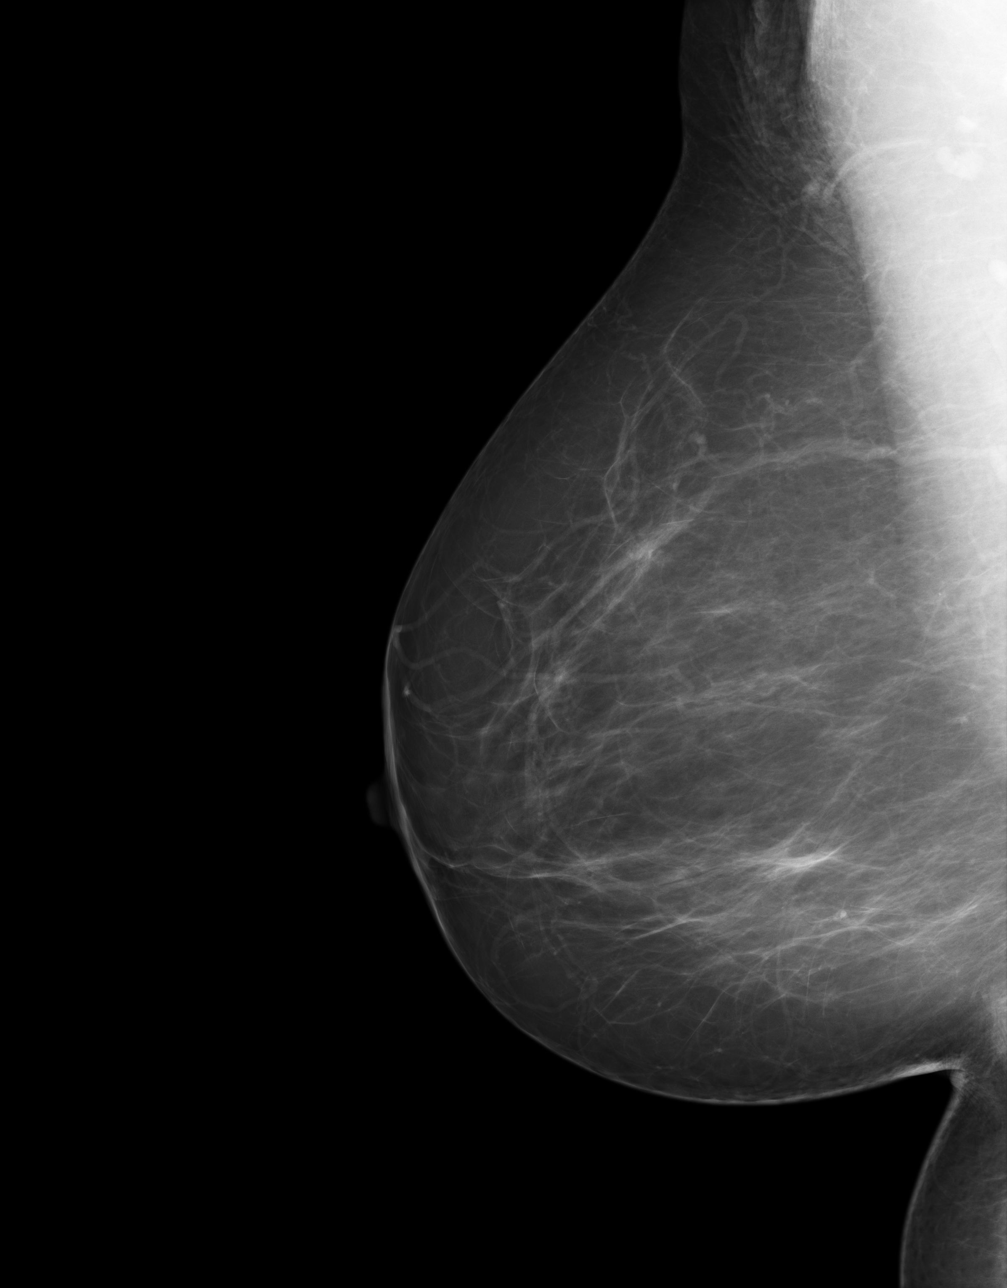
[im 4/4]
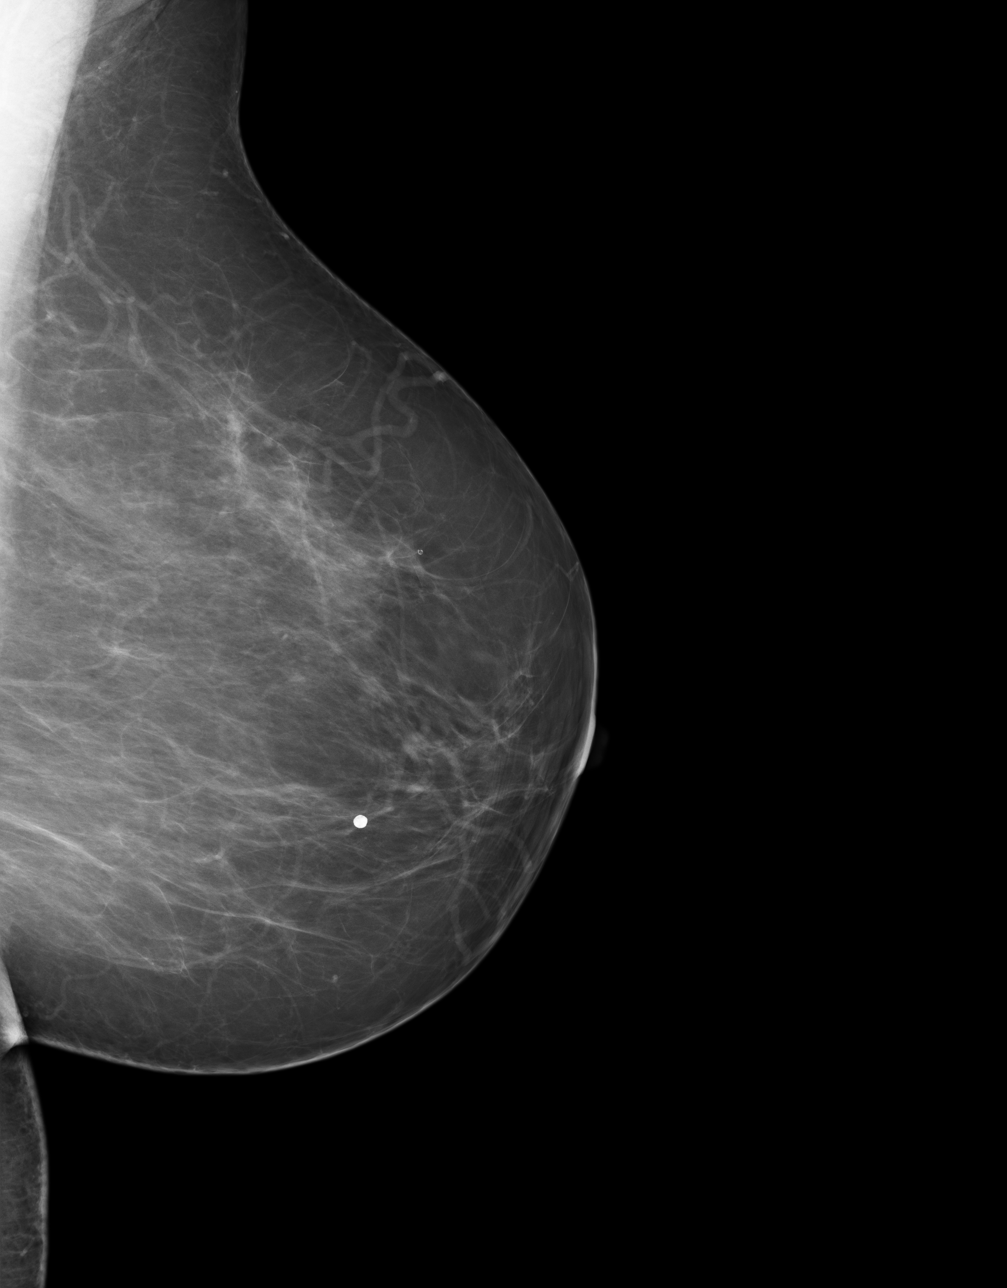

[4 of 4 positions shown; findings below may reference images not displayed]

PROCEDURE:     MAM - MAM DGTL SCRN MAM NO ORDER W/CAD  - [DATE]  [DATE]

RESULT:     Interpretation was delayed pending efforts to obtain previous
studies from [REDACTED]. None were reportedly on
file there.

The breasts exhibit a scattered dense parenchymal pattern.  There is no
dominant mass. A benign-appearing rounded macrocalcification is present on
the left measuring approximately 4 mm in diameter. A 1-2 mm diameter
calcification is visible as well on the left.  I see no malignant-appearing
grouping of microcalcification.
IMPRESSION: 1.      I do not see findings suspicious for malignancy.
2.      BI-RADS:  Category 2- Benign Finding.

RECOMMENDATION:  Please continue to encourage yearly mammographic follow up.

A negative mammogram report does not preclude biopsy or other evaluation of
a clinically palpable or otherwise suspicious mass or lesion. Breast cancer
may not be detected by mammography in up to 10% of cases.

## 2011-07-28 IMAGING — CR DG CHEST 2V
1 series · 2 of 2 positions shown · non-contrast
Comparison: none

REASON FOR EXAM: night sweats
COMMENTS:

[Series 1: w chest pa · 0.14mm/px · 2 of 2 slices shown]
[im 1/2]
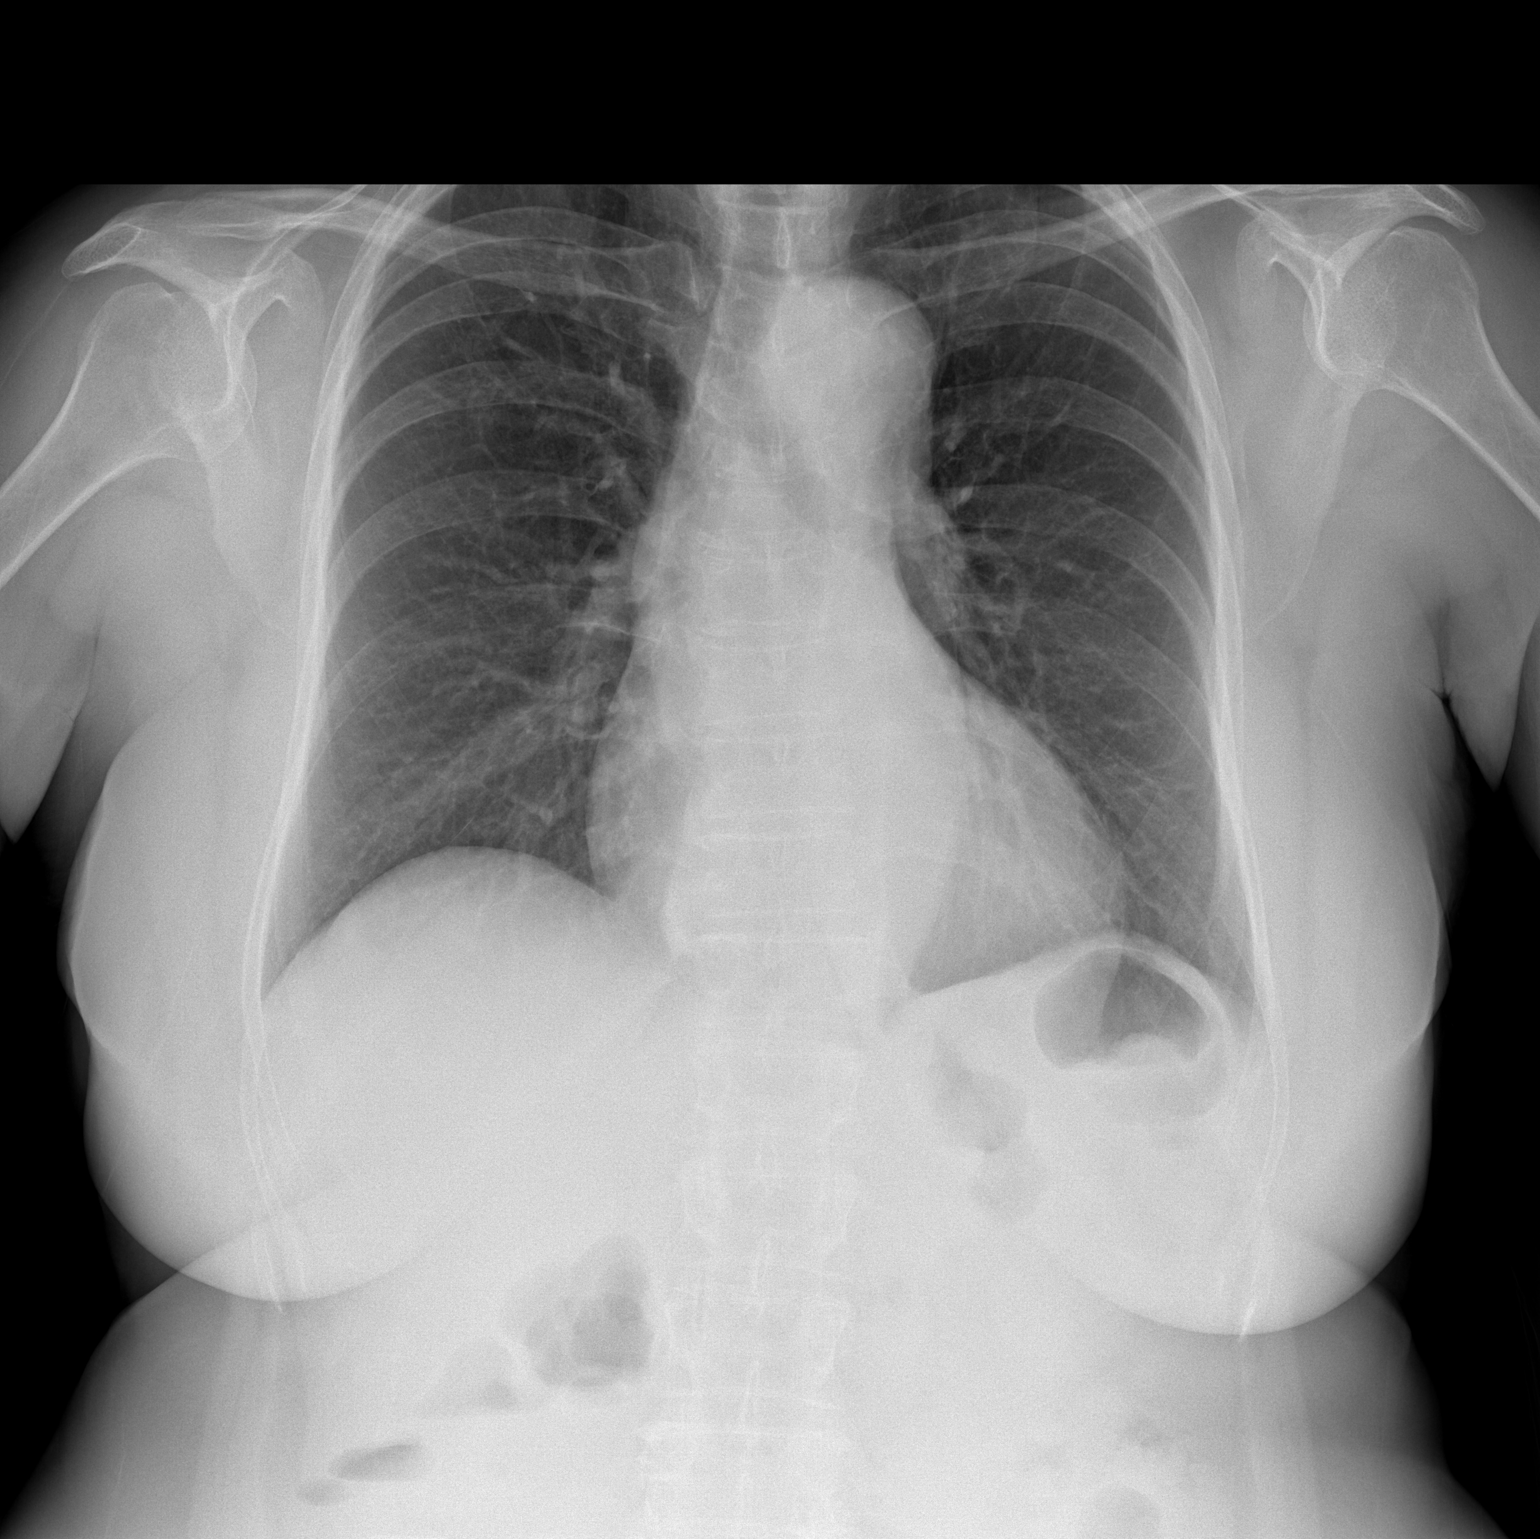
[im 2/2]
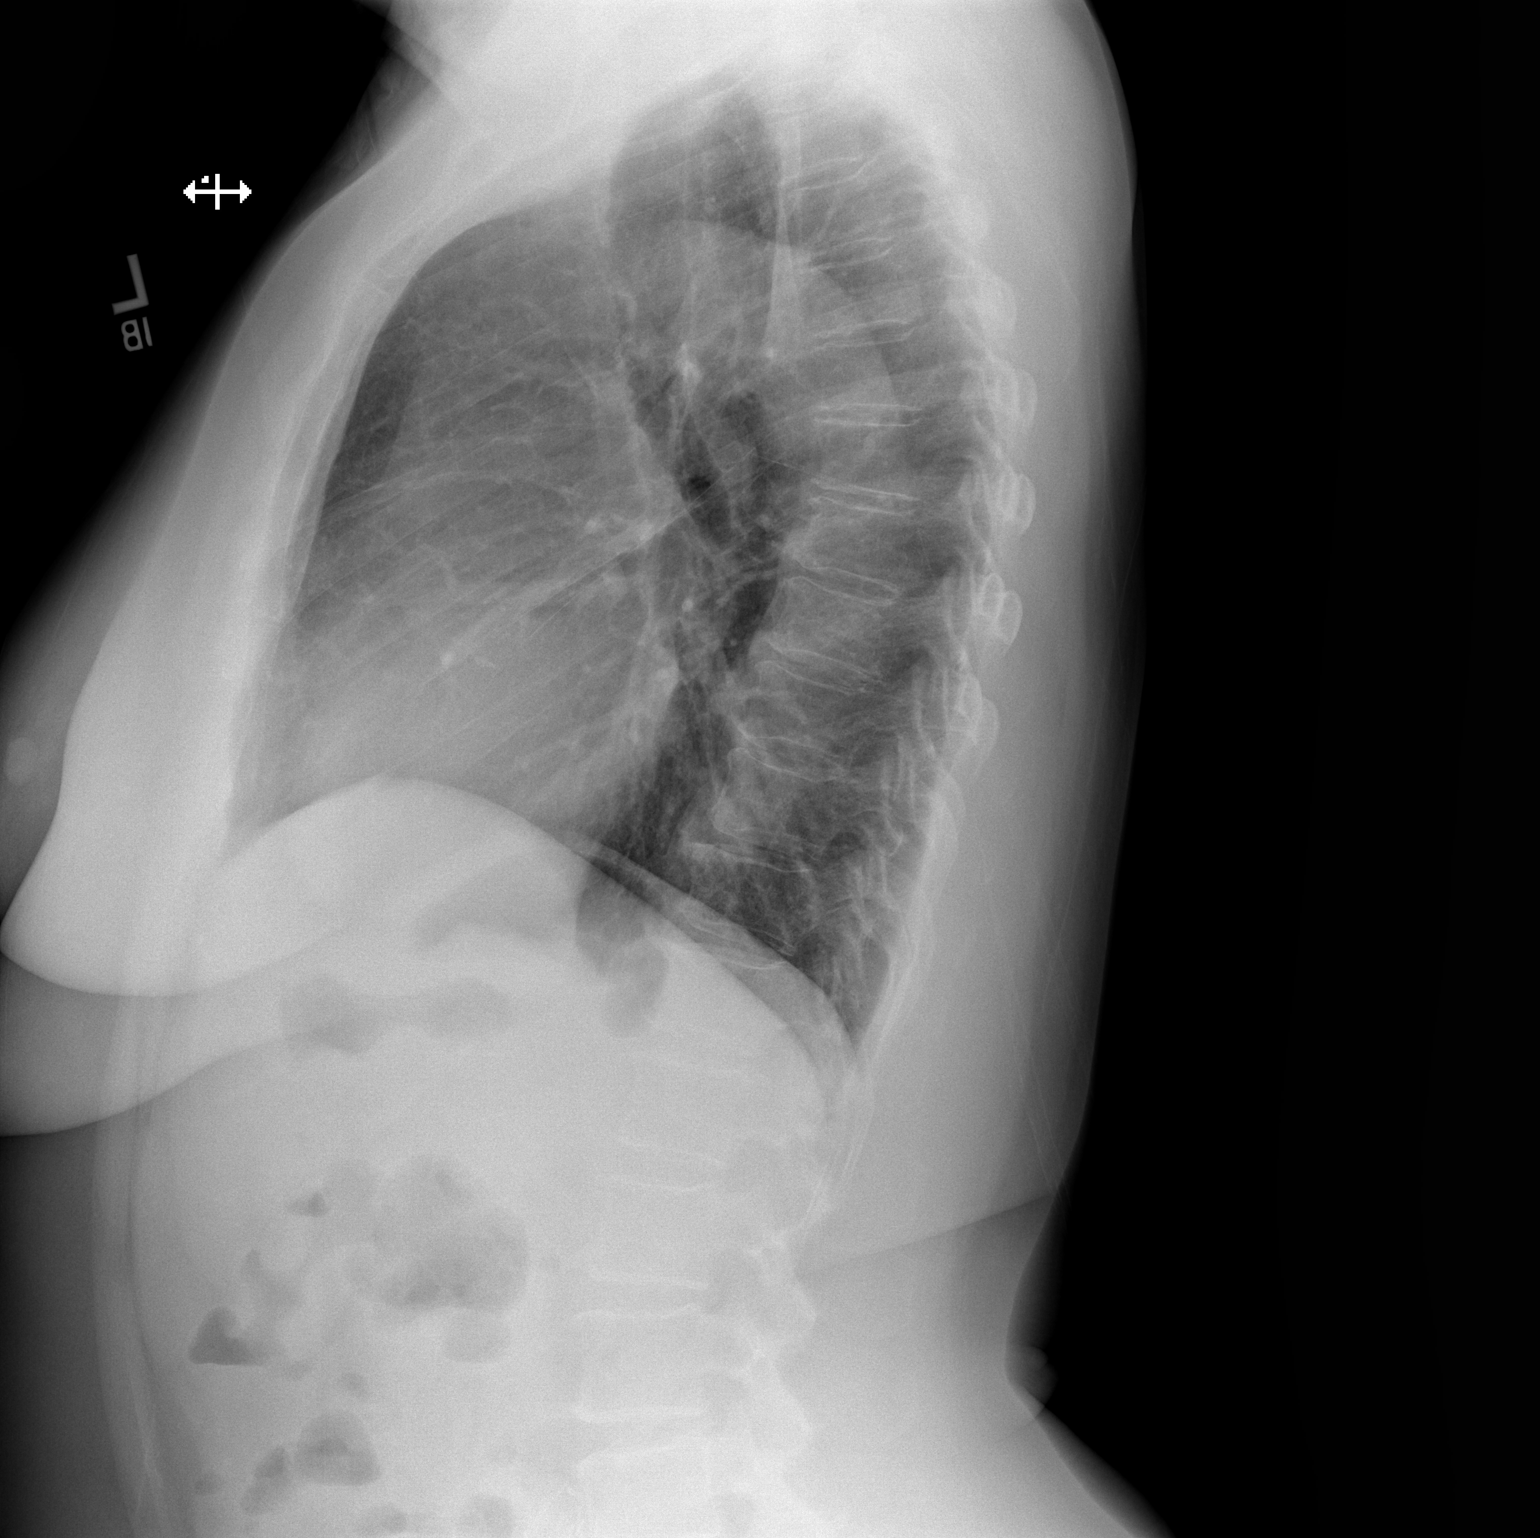

[2 of 2 positions shown; findings below may reference images not displayed]

PROCEDURE:     DXR - DXR CHEST PA (OR AP) AND LATERAL  - [DATE]  [DATE]

RESULT:     Comparison is made to the study of [DATE].

The lungs are clear. The heart and pulmonary vessels are normal. The bony
and mediastinal structures are unremarkable. There is no effusion. There is
no pneumothorax or evidence of congestive failure.
IMPRESSION: No acute cardiopulmonary disease. Stable appearance.

## 2011-10-22 ENCOUNTER — Emergency Department: Payer: Self-pay | Admitting: Emergency Medicine

## 2021-05-06 ENCOUNTER — Encounter: Payer: Self-pay | Admitting: Nurse Practitioner

## 2021-05-06 ENCOUNTER — Other Ambulatory Visit: Payer: Self-pay

## 2021-05-06 ENCOUNTER — Ambulatory Visit (INDEPENDENT_AMBULATORY_CARE_PROVIDER_SITE_OTHER): Payer: Medicare Other | Admitting: Nurse Practitioner

## 2021-05-06 VITALS — Ht 62.5 in | Wt 198.6 lb

## 2021-05-06 DIAGNOSIS — R7303 Prediabetes: Secondary | ICD-10-CM

## 2021-05-06 DIAGNOSIS — I10 Essential (primary) hypertension: Secondary | ICD-10-CM | POA: Diagnosis not present

## 2021-05-06 DIAGNOSIS — M7989 Other specified soft tissue disorders: Secondary | ICD-10-CM

## 2021-05-06 DIAGNOSIS — Z7689 Persons encountering health services in other specified circumstances: Secondary | ICD-10-CM

## 2021-05-06 DIAGNOSIS — Z8679 Personal history of other diseases of the circulatory system: Secondary | ICD-10-CM | POA: Diagnosis not present

## 2021-05-06 NOTE — Progress Notes (Signed)
Ht 5' 2.5" (1.588 m)   Wt 198 lb 9.6 oz (90.1 kg)   SpO2 96%   BMI 35.75 kg/m    Subjective:    Patient ID: Amanda Ochoa, female    DOB: 17-Nov-1950, 70 y.o.   MRN: [id number]  HPI: Amanda Ochoa is a 70 y.o. female  Chief Complaint  Patient presents with   Establish Care   Sciatica   Flank Pain   Leg Swelling    Patient state she had surgery to have a cardiac stent placed in November of 2020 and every since the surgery patient state she notices swelling in her left lower extremity and has been dealing with this issues since 2020 and 2021. Patient states she followed up with the Cardiologist office and was advised to try Compression Stocking and states she is in so much pain that she can barely wear the supporting stockings. Patient states every now and then she takes an OTC BC powder or elevate the leg or soak    Patient presents to clinic to establish care with new PCP.  Patient states she moved to [place] and she recently moved back to the area.  Patient reports a history of Hypertension, patient had a stent placed in November 2020 secondary to AAA, prediabetes.  Denies HA, CP, SOB, dizziness, palpitations, visual changes.   Patient denies a history of: Elevated Cholesterol, Thyroid problems, Depression, Anxiety, Neurological problems, and Abdominal problems.   SCIATICA/ LEG SWELLING Patient states she is having a lot of pain that radiates down her left leg. She states it is hard for her to stand.  Patient states her left leg has been swelling since she had her stent.Patient states it feels like it aches down to the bone.  Patient states she is concerned that she may have cancer because her previous doctors haven't been able to find anything.  They have suggested that she wear compression stockings and she hasn't been able to wear them because it causes her too much pain.  Patient has seen a chiropractor who recommended that she see a vascular doctor to evaluate for blockages. Patient  states her leg gives out on her if she stands too long.     Active Ambulatory Problems    Diagnosis Date Noted   Prediabetes 05/06/2021   History of abdominal aortic aneurysm (AAA) 05/06/2021   Hypertension 05/06/2021   Resolved Ambulatory Problems    Diagnosis Date Noted   No Resolved Ambulatory Problems   Past Medical History:  Diagnosis Date   Abdominal aneurysm ([place])    Diverticulitis    GERD (gastroesophageal reflux disease)    Sciatica    Past Surgical History:  Procedure Laterality Date   ABDOMINAL HYSTERECTOMY     CORONARY ANGIOPLASTY WITH STENT PLACEMENT      Family History  Problem Relation Age of Onset   Diabetes Mother    Healthy Brother    Healthy Daughter    Healthy Son    Diabetes Maternal Grandmother    Heart disease Maternal Grandmother    Healthy Maternal Grandfather       Review of Systems  Eyes:  Negative for visual disturbance.  Respiratory:  Negative for cough, chest tightness and shortness of breath.   Cardiovascular:  Positive for leg swelling (left leg). Negative for chest pain and palpitations.  Neurological:  Negative for dizziness and headaches.   Per HPI unless specifically indicated above     Objective:    Ht 5' 2.5" (1.588 m)  Wt 198 lb 9.6 oz (90.1 kg)   SpO2 96%   BMI 35.75 kg/m   Wt Readings from Last 3 Encounters:  05/06/21 198 lb 9.6 oz (90.1 kg)    Physical Exam Vitals and nursing note reviewed.  Constitutional:      General: She is not in acute distress.    Appearance: Normal appearance. She is normal weight. She is not ill-appearing, toxic-appearing or diaphoretic.  HENT:     Head: Normocephalic.     Right Ear: External ear normal.     Left Ear: External ear normal.     Nose: Nose normal.     Mouth/Throat:     Mouth: Mucous membranes are moist.     Pharynx: Oropharynx is clear.  Eyes:     General:        Right eye: No discharge.        Left eye: No discharge.     Extraocular Movements: Extraocular  movements intact.     Conjunctiva/sclera: Conjunctivae normal.     Pupils: Pupils are equal, round, and reactive to light.  Cardiovascular:     Rate and Rhythm: Normal rate and regular rhythm.     Heart sounds: No murmur heard. Pulmonary:     Effort: Pulmonary effort is normal. No respiratory distress.     Breath sounds: Normal breath sounds. No wheezing or rales.  Musculoskeletal:        General: Swelling and tenderness present.     Cervical back: Normal range of motion and neck supple.     Left lower leg: Edema present.  Skin:    General: Skin is warm and dry.     Capillary Refill: Capillary refill takes less than 2 seconds.  Neurological:     General: No focal deficit present.     Mental Status: She is alert and oriented to person, place, and time. Mental status is at baseline.  Psychiatric:        Mood and Affect: Mood normal.        Behavior: Behavior normal.        Thought Content: Thought content normal.        Judgment: Judgment normal.    No results found for this or any previous visit.    Assessment & Plan:   Problem List Items Addressed This Visit       Cardiovascular and Mediastinum   Hypertension    Chronic.  Controlled.  Continue with current medication regimen.  Labs ordered today.  Return to clinic in 3 months for reevaluation.  Call sooner if concerns arise.  Patient see's a Film/video editor. She saw them in March and follows up annually. Will place referral at next visit.        Relevant Medications   carvedilol (COREG) 25 MG tablet   amLODipine (NORVASC) 10 MG tablet   losartan (COZAAR) 50 MG tablet   Other Relevant Orders   Comp Met (CMET)   Lipid Profile     Other   Prediabetes - Primary    Labs ordered today.  Will make recommendations based on lab work.       Relevant Orders   Comp Met (CMET)   HgB A1c   History of abdominal aortic aneurysm (AAA)    Chronic.  Controlled.  Continue with current medication regimen.  Labs ordered today.   Return to clinic in 3 months for reevaluation.  Call sooner if concerns arise.  Patient see's a Film/video editor. She saw them in March and follows  up annually. Will place referral at next visit.       Relevant Orders   Comp Met (CMET)   Lipid Profile   Other Visit Diagnoses     Left leg swelling       Referral placed for patient to see Vascular. If unable to determine cause of swelling can refer to orthopedics. Patient agrees with the plan of care.   Relevant Orders   Comp Met (CMET)   Ambulatory referral to Vascular Surgery   Encounter to establish care            Follow up plan: Return in about 3 months (around 08/06/2021) for HTN, HLD, DM2 FU.

## 2021-05-06 NOTE — Assessment & Plan Note (Signed)
Chronic.  Controlled.  Continue with current medication regimen.  Labs ordered today.  Return to clinic in 3 months for reevaluation.  Call sooner if concerns arise.  Patient see's a Film/video editor. She saw them in March and follows up annually. Will place referral at next visit.

## 2021-05-06 NOTE — Assessment & Plan Note (Signed)
Labs ordered today.  Will make recommendations based on lab work.

## 2021-05-07 LAB — LIPID PANEL
Chol/HDL Ratio: 3.5 ratio (ref 0.0–4.4)
Cholesterol, Total: 167 mg/dL (ref 100–199)
HDL: 48 mg/dL (ref >39)
LDL Chol Calc (NIH): 104 mg/dL — ABNORMAL HIGH (ref 0–99)
Triglycerides: 81 mg/dL (ref 0–149)
VLDL Cholesterol Cal: 15 mg/dL (ref 5–40)

## 2021-05-07 LAB — COMPREHENSIVE METABOLIC PANEL
ALT: 17 IU/L (ref 0–32)
AST: 17 IU/L (ref 0–40)
Albumin/Globulin Ratio: 1.8 (ref 1.2–2.2)
Albumin: 4.2 g/dL (ref 3.8–4.8)
Alkaline Phosphatase: 97 IU/L (ref 44–121)
BUN/Creatinine Ratio: 20 (ref 12–28)
BUN: 16 mg/dL (ref 8–27)
Bilirubin Total: 0.4 mg/dL (ref 0.0–1.2)
CO2: 24 mmol/L (ref 20–29)
Calcium: 9.3 mg/dL (ref 8.7–10.3)
Chloride: 104 mmol/L (ref 96–106)
Creatinine, Ser: 0.79 mg/dL (ref 0.57–1.00)
Globulin, Total: 2.4 g/dL (ref 1.5–4.5)
Glucose: 80 mg/dL (ref 65–99)
Potassium: 3.9 mmol/L (ref 3.5–5.2)
Sodium: 142 mmol/L (ref 134–144)
Total Protein: 6.6 g/dL (ref 6.0–8.5)
eGFR: 81 mL/min/{1.73_m2} (ref >59)

## 2021-05-07 LAB — HEMOGLOBIN A1C
Est. average glucose Bld gHb Est-mCnc: 123 mg/dL
Hgb A1c MFr Bld: 5.9 % — ABNORMAL HIGH (ref 4.8–5.6)

## 2021-05-07 NOTE — Progress Notes (Signed)
Hi Ms. Annaelise.  It was a pleasure to meet you yesterday.  Your lab work looks great.  A1c remains stable at 5.9.  Keep up the good work.  Cholesterol is only slightly elevated.  Continue with your current medication regimen.  I will see you at your next visit. Please let me know if you have any questions.

## 2021-05-12 ENCOUNTER — Telehealth: Payer: Self-pay

## 2021-05-12 NOTE — Telephone Encounter (Signed)
Copied from [place] [id number]. Topic: Appointment Scheduling - Scheduling Inquiry for Clinic >> May 12, 2021  8:22 AM Valere Dross wrote: Reason for CRM: Pt called in about her referral with Vascular surgery and wanted to get their information and telephone number so she can reach out to them. Please advise.

## 2021-05-12 NOTE — Telephone Encounter (Signed)
Referral sent to [facility]. Called and notified patient of this. Also provided her with their address and phone number.

## 2021-06-04 ENCOUNTER — Other Ambulatory Visit: Payer: Self-pay

## 2021-06-04 ENCOUNTER — Ambulatory Visit (INDEPENDENT_AMBULATORY_CARE_PROVIDER_SITE_OTHER): Payer: Medicare HMO | Admitting: Nurse Practitioner

## 2021-06-04 VITALS — BP 120/83 | HR 51 | Ht 64.0 in | Wt 198.0 lb

## 2021-06-04 DIAGNOSIS — Z8679 Personal history of other diseases of the circulatory system: Secondary | ICD-10-CM

## 2021-06-04 DIAGNOSIS — M79605 Pain in left leg: Secondary | ICD-10-CM

## 2021-06-04 DIAGNOSIS — M542 Cervicalgia: Secondary | ICD-10-CM

## 2021-06-04 DIAGNOSIS — M7989 Other specified soft tissue disorders: Secondary | ICD-10-CM | POA: Diagnosis not present

## 2021-06-07 ENCOUNTER — Encounter (INDEPENDENT_AMBULATORY_CARE_PROVIDER_SITE_OTHER): Payer: Self-pay | Admitting: Nurse Practitioner

## 2021-06-07 NOTE — Progress Notes (Signed)
Subjective:    Patient ID: Amanda Ochoa, female    DOB: [birth year], 70 y.o.   MRN: [id number] Chief Complaint  Patient presents with   New Patient (Initial Visit)    NP consult Left Leg swelling  referred by Mathis Dad Amanda Ochoa is a 70 year old female that presents today as a referral from her primary care provider Mathis Dad, NP in regards to lower extremity edema.  Patient is that she has swelling in her left lower extremity.  She notes that she has tried wearing compression although not for extended periods due to the pain that it causes.  She also notes intense pain in the left lower extremity.  She notes that she has a history of sciatica and the pain radiates from her buttocks down her leg but this pain is more so below her knee and in her shin area.  She is concerned by this pain in her circulation due to having a stent placed in her lower abdomen.  She does not have any stent cards we do not have any previous history due to [place]  It is unclear if this is a repair of an endovascular aortic aneurysm more of these are iliac stents.  Patient does not have any stent cards available currently.  Also concern for the patient is pain and tenderness in her right neck area.  She notes it is difficult for her to turn her neck and there is a tightness in that area.  She is concerned that this may be vascular related as well.  She has no open wounds or ulcerations.  She does not have rest pain symptoms.   Review of Systems  Cardiovascular:  Positive for leg swelling.  Musculoskeletal:  Positive for myalgias.  All other systems reviewed and are negative.     Objective:   Physical Exam Vitals reviewed.  HENT:     Head: Normocephalic.  Cardiovascular:     Rate and Rhythm: Normal rate.     Pulses:          Popliteal pulses are 1+ on the left side.       Dorsalis pedis pulses are 1+ on the left side.  Pulmonary:     Effort: Pulmonary  effort is normal.  Skin:    General: Skin is warm and dry.  Neurological:     Mental Status: She is alert and oriented to person, place, and time.  Psychiatric:        Mood and Affect: Mood normal.        Behavior: Behavior normal.        Thought Content: Thought content normal.        Judgment: Judgment normal.    BP 120/83   Pulse (!) 51   Ht '5\' 4"'$  (1.626 m)   Wt 198 lb (89.8 kg)   BMI 33.99 kg/m   Past Medical History:  Diagnosis Date   Abdominal aneurysm (HCC)    Diverticulitis    GERD (gastroesophageal reflux disease)    Hypertension    Sciatica     Social History   Socioeconomic History   Marital status: Single    Spouse name: Not on file   Number of children: Not on file   Years of education: Not on file   Highest education level: Not on file  Occupational History   Not on file  Tobacco Use   Smoking status: Never   Smokeless tobacco: Never  Vaping Use   Vaping Use: Never used  Substance and Sexual Activity   Alcohol use: Never   Drug use: Never   Sexual activity: Not on file  Other Topics Concern   Not on file  Social History Narrative   Not on file   Social Determinants of Health   Financial Resource Strain: Not on file  Food Insecurity: Not on file  Transportation Needs: Not on file  Physical Activity: Not on file  Stress: Not on file  Social Connections: Not on file  Intimate Partner Violence: Not on file    Past Surgical History:  Procedure Laterality Date   ABDOMINAL HYSTERECTOMY     CORONARY ANGIOPLASTY WITH STENT PLACEMENT      Family History  Problem Relation Age of Onset   Diabetes Mother    Healthy Brother    Healthy Daughter    Healthy Son    Diabetes Maternal Grandmother    Heart disease Maternal Grandmother    Healthy Maternal Grandfather     Allergies  Allergen Reactions   Penicillins Itching and Rash    No flowsheet data found.    CMP     Component Value Date/Time   NA 142 05/06/2021 1357   K 3.9  05/06/2021 1357   CL 104 05/06/2021 1357   CO2 24 05/06/2021 1357   GLUCOSE 80 05/06/2021 1357   BUN 16 05/06/2021 1357   CREATININE 0.79 05/06/2021 1357   CALCIUM 9.3 05/06/2021 1357   PROT 6.6 05/06/2021 1357   ALBUMIN 4.2 05/06/2021 1357   AST 17 05/06/2021 1357   ALT 17 05/06/2021 1357   ALKPHOS 97 05/06/2021 1357   BILITOT 0.4 05/06/2021 1357     No results found.     Assessment & Plan:   1. History of abdominal aortic aneurysm (AAA) The patient notes that she has a stent in her abdomen.  What is not clear is if it is related to an abdominal aortic aneurysm endovascular repair or if it is related to iliac stents. - VAS US AORTA/IVC/ILIACS; Future  2. Pain of left lower extremity  Recommend:  The patient has atypical pain symptoms for pure atherosclerotic disease. 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  Noninvasive studies including ABI's and venous ultrasound of the legs will be obtained and the patient will follow up with me to review these studies.  I suspect the patient is c/o pseudoclaudication.  Patient should have an evaluation of his LS spine which I defer to the primary service.  The patient should continue walking and begin a more formal exercise program. The patient should continue his antiplatelet therapy and aggressive treatment of the lipid abnormalities.  The patient should begin wearing graduated compression socks to control edema.   - VAS [place] ABI WITH/WO TBI; Future  3. Leg swelling I have had a long discussion with the patient regarding swelling and why it  causes symptoms.  Patient is advised to utilize medical grade compression stockings 20 to 30 mmHg for control of swelling.  The patient is advised to wear the stockings first thing in the morning and removing them in the evening. The patient is instructed specifically not to  sleep in the stockings.   In addition, behavioral modification will be initiated.  This will include frequent elevation, use of over the counter pain medications and exercise such as walking.  I have  reviewed systemic causes for chronic edema such as liver, kidney and cardiac etiologies.  The patient denies problems with these organ systems.    Consideration for a lymph pump will also be made based upon the effectiveness of conservative therapy.  This would help to improve the edema control and prevent sequela such as ulcers and infections   Patient should undergo duplex ultrasound of the venous system to ensure that DVT or reflux is not present.  The patient will follow-up with me after the ultrasound.   - VAS [place] LOWER EXTREMITY VENOUS REFLUX; Future  4. Neck pain The patient has pain and discomfort in her right neck area.  The muscles are very tense in this area.  She is concerned that there may be an occlusion of some sort.  It is more likely that this is musculoskeletal related however per patient request we will evaluate with carotid duplex.  If negative patient will follow-up with PCP for further work-up. - VAS US CAROTID; Future   Current Outpatient Medications on File Prior to Visit  Medication Sig Dispense Refill   amLODipine (NORVASC) 10 MG tablet Take 10 mg by mouth daily.     carvedilol (COREG) 25 MG tablet Take 25 mg by mouth 2 (two) times daily with a meal.     cholecalciferol (VITAMIN D) 25 MCG (1000 UNIT) tablet Take 1,000 Units by mouth daily.     clopidogrel (PLAVIX) 75 MG tablet Take 75 mg by mouth daily.     losartan (COZAAR) 50 MG tablet Take 50 mg by mouth daily.     Multiple Minerals-Vitamins (CALCIUM-MAGNESIUM-ZINC-D3) TABS Take by mouth.     Potassium 99 MG TABS Take by mouth.     Vitamin A 2400 MCG (8000 UT) TABS Take by mouth.     vitamin E 180 MG (400 UNITS) capsule Take 400 Units by mouth daily.     No current facility-administered medications on file prior  to visit.    There are no Patient Instructions on file for this visit. No follow-ups on file.   Kris Hartmann, NP

## 2021-06-09 ENCOUNTER — Other Ambulatory Visit: Payer: Self-pay | Admitting: Nurse Practitioner

## 2021-06-09 DIAGNOSIS — H903 Sensorineural hearing loss, bilateral: Secondary | ICD-10-CM | POA: Diagnosis not present

## 2021-06-09 NOTE — Telephone Encounter (Signed)
Medication Refill - Medication: amLODipine (NORVASC) 10 MG tablet   clopidogrel (PLAVIX) 75 MG tablet  losartan (COZAAR) 50 MG tablet  Pt would like 3 month supplies of her meds   Has the patient contacted their pharmacy? Yes.   (Agent: If no, request that the patient contact the pharmacy for the refill.) (Agent: If yes, when and what did the pharmacy advise?) Call PCP  Preferred Pharmacy (with phone number or street name):  [facility] [place] Phone:  [phone]  Fax:  [phone]      Agent: Please be advised that RX refills may take up to 3 business days. We ask that you follow-up with your pharmacy.

## 2021-06-10 MED ORDER — CLOPIDOGREL BISULFATE 75 MG PO TABS: 75.0000 mg | ORAL_TABLET | Freq: Every day | ORAL | 1 refills | Status: DC

## 2021-06-10 MED ORDER — AMLODIPINE BESYLATE 10 MG PO TABS: 10.0000 mg | ORAL_TABLET | Freq: Every day | ORAL | 1 refills | Status: DC

## 2021-06-10 MED ORDER — LOSARTAN POTASSIUM 50 MG PO TABS: 50.0000 mg | ORAL_TABLET | Freq: Every day | ORAL | 1 refills | Status: DC

## 2021-06-10 NOTE — Telephone Encounter (Signed)
Patient last seen 05/06/21 and has appointment 08/06/21

## 2021-06-14 ENCOUNTER — Encounter: Payer: Self-pay | Admitting: Nurse Practitioner

## 2021-06-14 ENCOUNTER — Other Ambulatory Visit: Payer: Self-pay

## 2021-06-14 ENCOUNTER — Ambulatory Visit (INDEPENDENT_AMBULATORY_CARE_PROVIDER_SITE_OTHER): Payer: Medicare HMO | Admitting: Nurse Practitioner

## 2021-06-14 VITALS — Ht 64.02 in | Wt 197.8 lb

## 2021-06-14 DIAGNOSIS — Z780 Asymptomatic menopausal state: Secondary | ICD-10-CM | POA: Diagnosis not present

## 2021-06-14 DIAGNOSIS — Z23 Encounter for immunization: Secondary | ICD-10-CM

## 2021-06-14 DIAGNOSIS — R350 Frequency of micturition: Secondary | ICD-10-CM

## 2021-06-14 LAB — MICROSCOPIC EXAMINATION

## 2021-06-14 LAB — URINALYSIS, ROUTINE W REFLEX MICROSCOPIC
Bilirubin, UA: NEGATIVE
Glucose, UA: NEGATIVE
Ketones, UA: NEGATIVE
Nitrite, UA: NEGATIVE
Protein,UA: NEGATIVE
Specific Gravity, UA: 1.02 (ref 1.005–1.030)
Urobilinogen, Ur: 0.2 mg/dL (ref 0.2–1.0)
pH, UA: 5.5 (ref 5.0–7.5)

## 2021-06-14 MED ORDER — SULFAMETHOXAZOLE-TRIMETHOPRIM 800-160 MG PO TABS: 1.0000 | ORAL_TABLET | Freq: Two times a day (BID) | ORAL | 0 refills | Status: DC

## 2021-06-14 NOTE — Progress Notes (Signed)
Acute Office Visit  Subjective:    Patient ID: Amanda Ochoa, female    DOB: 04/28/1951, 70 y.o.   MRN: [id number]  Chief Complaint  Patient presents with   trouble holding urine    Patient has been having frequency since for the past week or 2 with some burning after urination. Patient not having discharge but did treat for yeast infection OTC    HPI Patient is in today for urinary frequency and burning.   URINARY SYMPTOMS  Dysuria: burning Urinary frequency: yes Urgency: yes Small volume voids: no Symptom severity:  moderate Urinary incontinence: yes worse laying down at night Foul odor: no Hematuria: no Abdominal pain: no Back pain: no Suprapubic pain/pressure: yes Flank pain: no Fever:  no Vomiting: no Relief with cranberry juice:  n/a Relief with pyridium:  n/a Status: worse Previous urinary tract infection: yes Recurrent urinary tract infection: no Sexual activity: No sexually active Treatments attempted: increasing fluids, miconzole cream, tablet OTC    Past Medical History:  Diagnosis Date   Abdominal aneurysm (HCC)    Diverticulitis    GERD (gastroesophageal reflux disease)    Hypertension    Sciatica     Past Surgical History:  Procedure Laterality Date   ABDOMINAL HYSTERECTOMY     CORONARY ANGIOPLASTY WITH STENT PLACEMENT      Family History  Problem Relation Age of Onset   Diabetes Mother    Healthy Brother    Healthy Daughter    Healthy Son    Diabetes Maternal Grandmother    Heart disease Maternal Grandmother    Healthy Maternal Grandfather     Social History   Socioeconomic History   Marital status: Single    Spouse name: Not on file   Number of children: Not on file   Years of education: Not on file   Highest education level: Not on file  Occupational History   Not on file  Tobacco Use   Smoking status: Never   Smokeless tobacco: Never  Vaping Use   Vaping Use: Never used  Substance and Sexual Activity   Alcohol use:  Never   Drug use: Never   Sexual activity: Not on file  Other Topics Concern   Not on file  Social History Narrative   Not on file   Social Determinants of Health   Financial Resource Strain: Not on file  Food Insecurity: Not on file  Transportation Needs: Not on file  Physical Activity: Not on file  Stress: Not on file  Social Connections: Not on file  Intimate Partner Violence: Not on file    Outpatient Medications Prior to Visit  Medication Sig Dispense Refill   amLODipine (NORVASC) 10 MG tablet Take 1 tablet (10 mg total) by mouth daily. 90 tablet 1   carvedilol (COREG) 25 MG tablet Take 25 mg by mouth 2 (two) times daily with a meal.     cholecalciferol (VITAMIN D) 25 MCG (1000 UNIT) tablet Take 1,000 Units by mouth daily.     clopidogrel (PLAVIX) 75 MG tablet Take 1 tablet (75 mg total) by mouth daily. 90 tablet 1   losartan (COZAAR) 50 MG tablet Take 1 tablet (50 mg total) by mouth daily. 90 tablet 1   Multiple Minerals-Vitamins (CALCIUM-MAGNESIUM-ZINC-D3) TABS Take by mouth.     Vitamin A 2400 MCG (8000 UT) TABS Take by mouth.     Potassium 99 MG TABS Take by mouth. (Patient not taking: Reported on 06/14/2021)     vitamin E 180 MG (  400 UNITS) capsule Take 400 Units by mouth daily. (Patient not taking: Reported on 06/14/2021)     No facility-administered medications prior to visit.    Allergies  Allergen Reactions   Penicillins Itching and Rash    Review of Systems  Constitutional: Negative.   Gastrointestinal:  Positive for abdominal pain (pressure) and nausea. Negative for constipation, diarrhea and vomiting.  Genitourinary:  Positive for dysuria, frequency and urgency. Negative for hematuria.  Musculoskeletal: Negative.       Objective:    Physical Exam Vitals and nursing note reviewed.  Constitutional:      General: She is not in acute distress.    Appearance: Normal appearance.  HENT:     Head: Normocephalic.  Eyes:     Conjunctiva/sclera:  Conjunctivae normal.  Cardiovascular:     Rate and Rhythm: Normal rate and regular rhythm.     Pulses: Normal pulses.     Heart sounds: Normal heart sounds.  Pulmonary:     Effort: Pulmonary effort is normal.     Breath sounds: Normal breath sounds.  Abdominal:     Tenderness: There is no abdominal tenderness.  Musculoskeletal:     Cervical back: Normal range of motion.  Skin:    General: Skin is warm.  Neurological:     General: No focal deficit present.     Mental Status: She is alert and oriented to person, place, and time.  Psychiatric:        Mood and Affect: Mood normal.        Behavior: Behavior normal.        Thought Content: Thought content normal.        Judgment: Judgment normal.    Ht 5' 4.02" (1.626 m)   Wt 197 lb 12.8 oz (89.7 kg)   BMI 33.94 kg/m  Wt Readings from Last 3 Encounters:  06/14/21 197 lb 12.8 oz (89.7 kg)  06/04/21 198 lb (89.8 kg)  05/06/21 198 lb 9.6 oz (90.1 kg)    Health Maintenance Due  Topic Date Due   Hepatitis C Screening  Never done   DEXA SCAN  Never done   COVID-19 Vaccine (4 - Booster for Moderna series) 02/21/2021    There are no preventive care reminders to display for this patient.   No results found for: TSH No results found for: WBC, HGB, HCT, MCV, PLT Lab Results  Component Value Date   NA 142 05/06/2021   K 3.9 05/06/2021   CO2 24 05/06/2021   GLUCOSE 80 05/06/2021   BUN 16 05/06/2021   CREATININE 0.79 05/06/2021   BILITOT 0.4 05/06/2021   ALKPHOS 97 05/06/2021   AST 17 05/06/2021   ALT 17 05/06/2021   PROT 6.6 05/06/2021   ALBUMIN 4.2 05/06/2021   CALCIUM 9.3 05/06/2021   EGFR 81 05/06/2021   Lab Results  Component Value Date   CHOL 167 05/06/2021   Lab Results  Component Value Date   HDL 48 05/06/2021   Lab Results  Component Value Date   LDLCALC 104 (H) 05/06/2021   Lab Results  Component Value Date   TRIG 81 05/06/2021   Lab Results  Component Value Date   CHOLHDL 3.5 05/06/2021   Lab  Results  Component Value Date   HGBA1C 5.9 (H) 05/06/2021       Assessment & Plan:   Problem List Items Addressed This Visit   None Visit Diagnoses     Urinary frequency    -  Primary  U/A positive for few bacteria, 3+ leukocytes. Will treat for UTI and send urine for culture. F/U if symptoms worsen or don't improve   Relevant Orders   Urinalysis, Routine w reflex microscopic   Urine Culture   Need for influenza vaccination       Flu vaccine given today   Relevant Orders   Flu Vaccine QUAD High Dose(Fluad) (Completed)   Postmenopausal estrogen deficiency       Will check bone density dexa scan   Relevant Orders   DG Bone Density        Meds ordered this encounter  Medications   sulfamethoxazole-trimethoprim (BACTRIM DS) 800-160 MG tablet    Sig: Take 1 tablet by mouth 2 (two) times daily.    Dispense:  10 tablet    Refill:  0     Charyl Dancer, NP

## 2021-06-17 LAB — URINE CULTURE

## 2021-06-23 ENCOUNTER — Ambulatory Visit (INDEPENDENT_AMBULATORY_CARE_PROVIDER_SITE_OTHER): Payer: Medicare HMO

## 2021-06-23 ENCOUNTER — Encounter (INDEPENDENT_AMBULATORY_CARE_PROVIDER_SITE_OTHER): Payer: Self-pay | Admitting: Nurse Practitioner

## 2021-06-23 ENCOUNTER — Other Ambulatory Visit: Payer: Self-pay

## 2021-06-23 ENCOUNTER — Ambulatory Visit (INDEPENDENT_AMBULATORY_CARE_PROVIDER_SITE_OTHER): Payer: Medicare HMO | Admitting: Nurse Practitioner

## 2021-06-23 VITALS — BP 110/64 | HR 81 | Resp 16 | Wt 195.0 lb

## 2021-06-23 DIAGNOSIS — M79605 Pain in left leg: Secondary | ICD-10-CM | POA: Diagnosis not present

## 2021-06-23 DIAGNOSIS — Z8679 Personal history of other diseases of the circulatory system: Secondary | ICD-10-CM

## 2021-06-23 DIAGNOSIS — M7989 Other specified soft tissue disorders: Secondary | ICD-10-CM

## 2021-06-23 DIAGNOSIS — I10 Essential (primary) hypertension: Secondary | ICD-10-CM

## 2021-06-23 DIAGNOSIS — M542 Cervicalgia: Secondary | ICD-10-CM

## 2021-06-23 NOTE — Progress Notes (Signed)
Subjective:    Patient ID: Amanda Ochoa, female    DOB: December 12, 1950, 70 y.o.   MRN: [id number] Chief Complaint  Patient presents with  . Follow-up    Ultrasound follow up    Amanda Ochoa is a 70 year old female that presents today for noninvasive studies related to several issues.  Patient is that she has swelling in her left lower extremity.  She notes that she has tried wearing compression although not for extended periods due to the pain that it causes.  She also notes intense pain in the left lower extremity.  She notes that she has a history of sciatica and the pain radiates from her buttocks down her leg but this pain is more so below her knee and in her shin area.  She is concerned by this pain in her circulation due to having a stent placed in her lower abdomen.  She does not have any stent cards we do not have any previous history due to [place]  It is unclear if this is a repair of an endovascular aortic aneurysm more of these are iliac stents.  Patient does not have any stent cards available currently.  Also concern for the patient is pain and tenderness in her right neck area.  She notes it is difficult for her to turn her neck and there is a tightness in that area.  She is concerned that this may be vascular related as well.  She has no open wounds or ulcerations.  She does not have rest pain symptoms.  Today noninvasive studies show an ABI of 1.18 on the right lower extremity with an ABI 1.24 on the left.  The patient has triphasic waveforms with good toe waveforms on the right and biphasic waveforms on the left lower extremities and good toe waveforms.   Largest abdominal aortic measurement of 2.87 cm  The left lower extremity has reflux in the GSV at the [facility] and at the knee.    No evidence of stenosis of internal carotid arteries.  Normal flow in the bilateral subclavian arteries with bilateral vertebral arteries demonstrating antegrade  flow.     Review of Systems  Cardiovascular:  Positive for leg swelling.  Musculoskeletal:  Positive for gait problem.  All other systems reviewed and are negative.     Objective:   Physical Exam Vitals reviewed.  HENT:     Head: Normocephalic.  Cardiovascular:     Rate and Rhythm: Normal rate.     Pulses:          Dorsalis pedis pulses are 1+ on the right side and 1+ on the left side.       Posterior tibial pulses are 1+ on the right side and 1+ on the left side.  Pulmonary:     Effort: Pulmonary effort is normal.  Skin:    General: Skin is warm and dry.  Neurological:     Mental Status: She is alert and oriented to person, place, and time.  Psychiatric:        Mood and Affect: Mood normal.        Behavior: Behavior normal.        Thought Content: Thought content normal.        Judgment: Judgment normal.    BP 110/64 (BP Location: Right Arm)   Pulse 81   Resp 16   Wt 195 lb (88.5 kg)   BMI 33.45 kg/m   Past Medical History:  Diagnosis Date  . Abdominal aneurysm ([place])   . Diverticulitis   . GERD (gastroesophageal reflux disease)   . Hypertension   . Sciatica     Social History   Socioeconomic History  . Marital status: Single    Spouse name: Not on file  . Number of children: Not on file  . Years of education: Not on file  . Highest education level: Not on file  Occupational History  . Not on file  Tobacco Use  . Smoking status: Never  . Smokeless tobacco: Never  Vaping Use  . Vaping Use: Never used  Substance and Sexual Activity  . Alcohol use: Never  . Drug use: Never  . Sexual activity: Not on file  Other Topics Concern  . Not on file  Social History Narrative  . Not on file   Social Determinants of Health   Financial Resource Strain: Not on file  Food Insecurity: Not on file  Transportation Needs: Not on file  Physical Activity: Not on file  Stress: Not on file  Social Connections: Not on file  Intimate Partner Violence: Not on  file    Past Surgical History:  Procedure Laterality Date  . ABDOMINAL HYSTERECTOMY    . CORONARY ANGIOPLASTY WITH STENT PLACEMENT      Family History  Problem Relation Age of Onset  . Diabetes Mother   . Healthy Brother   . Healthy Daughter   . Healthy Son   . Diabetes Maternal Grandmother   . Heart disease Maternal Grandmother   . Healthy Maternal Grandfather     Allergies  Allergen Reactions  . Penicillins Itching and Rash    No flowsheet data found.    CMP     Component Value Date/Time   NA 142 05/06/2021 1357   K 3.9 05/06/2021 1357   CL 104 05/06/2021 1357   CO2 24 05/06/2021 1357   GLUCOSE 80 05/06/2021 1357   BUN 16 05/06/2021 1357   CREATININE 0.79 05/06/2021 1357   CALCIUM 9.3 05/06/2021 1357   PROT 6.6 05/06/2021 1357   ALBUMIN 4.2 05/06/2021 1357   AST 17 05/06/2021 1357   ALT 17 05/06/2021 1357   ALKPHOS 97 05/06/2021 1357   BILITOT 0.4 05/06/2021 1357     No results found.     Assessment & Plan:   1. Pain of left lower extremity Recommend:  I do not find evidence of Vascular pathology that would explain the patient's symptoms  The patient has atypical pain symptoms for vascular disease  I do not find evidence of Vascular pathology that would explain the patient's symptoms and I suspect the patient is c/o pseudoclaudication.   Noninvasive studies including venous ultrasound of the legs do not identify vascular problems  The patient should continue walking and begin a more formal exercise program. The patient should continue his antiplatelet therapy and aggressive treatment of the lipid abnormalities. The patient should begin wearing graduated compression socks 15-20 mmHg strength to control her mild edema. Will refer the patient to orthopedic surgery as I believe this is likely musculoskeletal in nature.  Further work-up of her lower extremity pain is deferred to the primary service     - Ambulatory referral to Orthopedic Surgery  2.  History of abdominal aortic aneurysm (AAA) Today noninvasive study showed no evidence of abdominal aortic aneurysm.  Largest aortic measurement is 2.87 cm.  There is no stent graft noted.   3. Primary hypertension Continue antihypertensive medications as already ordered, these medications have  been reviewed and there are no changes at this time.   4. Neck pain Noninvasive studies today showed no evidence of significant carotid stenosis.  Based on description of patient's pain this is related to a musculoskeletal issue.  Patient advised to follow-up with PCP for further evaluation.  5. Leg swelling I have had a long discussion with the patient regarding swelling and why it  causes symptoms.  Patient will begin wearing graduated compression stockings class 1 (20-30 mmHg) on a daily basis a prescription was given. The patient will  beginning wearing the stockings first thing in the morning and removing them in the evening. The patient is instructed specifically not to sleep in the stockings.   In addition, behavioral modification will be initiated.  This will include frequent elevation, use of over the counter pain medications and exercise such as walking.  I have reviewed systemic causes for chronic edema such as liver, kidney and cardiac etiologies.  The patient denies problems with these organ systems.    Consideration for a lymph pump will also be made based upon the effectiveness of conservative therapy.  This would help to improve the edema control and prevent sequela such as ulcers and infections   Patient should undergo duplex ultrasound of the venous system to ensure that DVT or reflux is not present.  The patient will follow-up in 3 months with no studies   Current Outpatient Medications on File Prior to Visit  Medication Sig Dispense Refill  . amLODipine (NORVASC) 10 MG tablet Take 1 tablet (10 mg total) by mouth daily. 90 tablet 1  . carvedilol (COREG) 25 MG tablet Take 25 mg by  mouth 2 (two) times daily with a meal.    . cholecalciferol (VITAMIN D) 25 MCG (1000 UNIT) tablet Take 1,000 Units by mouth daily.    . clopidogrel (PLAVIX) 75 MG tablet Take 1 tablet (75 mg total) by mouth daily. 90 tablet 1  . losartan (COZAAR) 50 MG tablet Take 1 tablet (50 mg total) by mouth daily. 90 tablet 1  . Multiple Minerals-Vitamins (CALCIUM-MAGNESIUM-ZINC-D3) TABS Take by mouth.    . Vitamin A 2400 MCG (8000 UT) TABS Take by mouth.    . Potassium 99 MG TABS Take by mouth. (Patient not taking: No sig reported)    . sulfamethoxazole-trimethoprim (BACTRIM DS) 800-160 MG tablet Take 1 tablet by mouth 2 (two) times daily. (Patient not taking: Reported on 06/23/2021) 10 tablet 0   No current facility-administered medications on file prior to visit.    There are no Patient Instructions on file for this visit. No follow-ups on file.   Kris Hartmann, NP

## 2021-06-24 DIAGNOSIS — Z0100 Encounter for examination of eyes and vision without abnormal findings: Secondary | ICD-10-CM | POA: Diagnosis not present

## 2021-06-24 DIAGNOSIS — H52229 Regular astigmatism, unspecified eye: Secondary | ICD-10-CM | POA: Diagnosis not present

## 2021-06-24 DIAGNOSIS — I10 Essential (primary) hypertension: Secondary | ICD-10-CM | POA: Diagnosis not present

## 2021-06-30 ENCOUNTER — Other Ambulatory Visit: Payer: Self-pay

## 2021-07-01 ENCOUNTER — Other Ambulatory Visit: Payer: Self-pay

## 2021-07-01 ENCOUNTER — Ambulatory Visit
Admission: RE | Admit: 2021-07-01 | Discharge: 2021-07-01 | Disposition: A | Discharge status: Home / Self Care | Payer: Medicare HMO | Source: Ambulatory Visit | Attending: Nurse Practitioner | Admitting: Nurse Practitioner

## 2021-07-01 ENCOUNTER — Other Ambulatory Visit: Payer: Self-pay | Admitting: *Deleted

## 2021-07-01 DIAGNOSIS — M85851 Other specified disorders of bone density and structure, right thigh: Secondary | ICD-10-CM | POA: Diagnosis not present

## 2021-07-01 DIAGNOSIS — Z780 Asymptomatic menopausal state: Secondary | ICD-10-CM | POA: Insufficient documentation

## 2021-07-01 DIAGNOSIS — M810 Age-related osteoporosis without current pathological fracture: Secondary | ICD-10-CM | POA: Diagnosis not present

## 2021-07-01 MED ORDER — ALENDRONATE SODIUM 70 MG PO TABS: 70.0000 mg | ORAL_TABLET | ORAL | 11 refills | Status: DC

## 2021-07-01 NOTE — Telephone Encounter (Signed)
Requested medications are due for refill today NO  Requested medications are on the active medication list NO  Last visit 06/14/21  Future visit scheduled 08/06/21  Notes to clinic Dose is inconsistent with current med list as well as historical provider, please assess.

## 2021-07-01 NOTE — Addendum Note (Signed)
Addended by: Vance Peper A on: 07/01/2021 03:55 PM   Modules accepted: Orders

## 2021-07-02 MED ORDER — CARVEDILOL 25 MG PO TABS: 25.0000 mg | ORAL_TABLET | Freq: Two times a day (BID) | ORAL | 0 refills | Status: DC

## 2021-07-02 NOTE — Addendum Note (Signed)
Addended by: Jon Billings on: 07/02/2021 08:46 AM   Modules accepted: Orders

## 2021-07-02 NOTE — Telephone Encounter (Signed)
Confirmed that patient is still taking medicaiton.

## 2021-07-02 NOTE — Telephone Encounter (Signed)
Can you please verify the dose she takes?

## 2021-07-06 DIAGNOSIS — H6122 Impacted cerumen, left ear: Secondary | ICD-10-CM | POA: Diagnosis not present

## 2021-07-06 DIAGNOSIS — M542 Cervicalgia: Secondary | ICD-10-CM | POA: Diagnosis not present

## 2021-07-06 DIAGNOSIS — G4733 Obstructive sleep apnea (adult) (pediatric): Secondary | ICD-10-CM | POA: Diagnosis not present

## 2021-07-07 DIAGNOSIS — H903 Sensorineural hearing loss, bilateral: Secondary | ICD-10-CM | POA: Diagnosis not present

## 2021-07-19 DIAGNOSIS — G4730 Sleep apnea, unspecified: Secondary | ICD-10-CM | POA: Diagnosis not present

## 2021-08-03 DIAGNOSIS — G4733 Obstructive sleep apnea (adult) (pediatric): Secondary | ICD-10-CM | POA: Diagnosis not present

## 2021-08-06 ENCOUNTER — Ambulatory Visit (INDEPENDENT_AMBULATORY_CARE_PROVIDER_SITE_OTHER): Payer: Medicare HMO | Admitting: Nurse Practitioner

## 2021-08-06 ENCOUNTER — Ambulatory Visit: Payer: Medicare Other | Admitting: Nurse Practitioner

## 2021-08-06 ENCOUNTER — Other Ambulatory Visit: Payer: Self-pay

## 2021-08-06 ENCOUNTER — Encounter: Payer: Self-pay | Admitting: Nurse Practitioner

## 2021-08-06 VITALS — BP 100/67 | HR 69 | Temp 97.8°F | Ht 63.2 in | Wt 198.2 lb

## 2021-08-06 DIAGNOSIS — R7303 Prediabetes: Secondary | ICD-10-CM

## 2021-08-06 DIAGNOSIS — M542 Cervicalgia: Secondary | ICD-10-CM | POA: Diagnosis not present

## 2021-08-06 DIAGNOSIS — I10 Essential (primary) hypertension: Secondary | ICD-10-CM

## 2021-08-06 DIAGNOSIS — Z1211 Encounter for screening for malignant neoplasm of colon: Secondary | ICD-10-CM

## 2021-08-06 NOTE — Assessment & Plan Note (Signed)
Chronic.  Controlled with diet and exercise.  Labs ordered today.  Return to clinic in 6 months for reevaluation.  Call sooner if concerns arise.

## 2021-08-06 NOTE — Progress Notes (Signed)
BP 100/67   Pulse 69   Temp 97.8 F (36.6 C) (Oral)   Ht 5' 3.2" (1.605 m)   Wt 198 lb 3.2 oz (89.9 kg)   SpO2 98%   BMI 34.89 kg/m    Subjective:    Patient ID: Amanda Ochoa, female    DOB: 1950-12-05, 70 y.o.   MRN: [id number]  HPI: Amanda Ochoa is a 70 y.o. female  Chief Complaint  Patient presents with   Diabetes   Hyperlipidemia   Hypertension   Referral    Pt states she would like to have a referral to a neurosurgeon for her neck    HYPERTENSION Hypertension status: controlled  Satisfied with current treatment? yes Duration of hypertension: years BP monitoring frequency:  daily BP range: 117/70s BP medication side effects:  no Medication compliance: excellent compliance Previous BP meds:amlodipine, carvedilol, and losartan (cozaar) Aspirin: no Recurrent headaches: no Visual changes: no Palpitations: no Dyspnea: no Chest pain: no Lower extremity edema: no Dizzy/lightheaded: no  NECK PAIN Patient states she gets a sinus headache and her head is full of mucus and then it causes her neck to hurt. Patient states she saw an ENT and he noticed tightness in her neck and would like her to see a neurosurgeon.   Relevant past medical, surgical, family and social history reviewed and updated as indicated. Interim medical history since our last visit reviewed. Allergies and medications reviewed and updated.  Review of Systems  Eyes:  Negative for visual disturbance.  Respiratory:  Negative for cough, chest tightness and shortness of breath.   Cardiovascular:  Negative for chest pain, palpitations and leg swelling.  Musculoskeletal:  Positive for neck pain.  Neurological:  Negative for dizziness and headaches.   Per HPI unless specifically indicated above     Objective:    BP 100/67   Pulse 69   Temp 97.8 F (36.6 C) (Oral)   Ht 5' 3.2" (1.605 m)   Wt 198 lb 3.2 oz (89.9 kg)   SpO2 98%   BMI 34.89 kg/m   Wt Readings from Last 3 Encounters:  08/06/21  198 lb 3.2 oz (89.9 kg)  06/23/21 195 lb (88.5 kg)  06/14/21 197 lb 12.8 oz (89.7 kg)    Physical Exam Vitals and nursing note reviewed.  Constitutional:      General: She is not in acute distress.    Appearance: Normal appearance. She is normal weight. She is not ill-appearing, toxic-appearing or diaphoretic.  HENT:     Head: Normocephalic.     Right Ear: External ear normal.     Left Ear: External ear normal.     Nose: Nose normal.     Mouth/Throat:     Mouth: Mucous membranes are moist.     Pharynx: Oropharynx is clear.  Eyes:     General:        Right eye: No discharge.        Left eye: No discharge.     Extraocular Movements: Extraocular movements intact.     Conjunctiva/sclera: Conjunctivae normal.     Pupils: Pupils are equal, round, and reactive to light.  Cardiovascular:     Rate and Rhythm: Normal rate and regular rhythm.     Heart sounds: No murmur heard. Pulmonary:     Effort: Pulmonary effort is normal. No respiratory distress.     Breath sounds: Normal breath sounds. No wheezing or rales.  Musculoskeletal:     Cervical back: Neck supple. Pain with movement present.  Decreased range of motion.  Skin:    General: Skin is warm and dry.     Capillary Refill: Capillary refill takes less than 2 seconds.  Neurological:     General: No focal deficit present.     Mental Status: She is alert and oriented to person, place, and time. Mental status is at baseline.  Psychiatric:        Mood and Affect: Mood normal.        Behavior: Behavior normal.        Thought Content: Thought content normal.        Judgment: Judgment normal.    Results for orders placed or performed in visit on 06/14/21  Urine Culture   Specimen: Urine   UR  Result Value Ref Range   Urine Culture, Routine Final report    Organism ID, Bacteria Comment   Microscopic Examination   Urine  Result Value Ref Range   WBC, UA 11-30 (A) 0 - 5 /hpf   RBC 0-2 0 - 2 /hpf   Epithelial Cells (non renal)  0-10 0 - 10 /hpf   Mucus, UA Present (A) Not Estab.   Bacteria, UA Few (A) None seen/Few  Urinalysis, Routine w reflex microscopic  Result Value Ref Range   Specific Gravity, UA 1.020 1.005 - 1.030   pH, UA 5.5 5.0 - 7.5   Color, UA Yellow Yellow   Appearance Ur Cloudy (A) Clear   Leukocytes,UA 3+ (A) Negative   Protein,UA Negative Negative/Trace   Glucose, UA Negative Negative   Ketones, UA Negative Negative   RBC, UA Trace (A) Negative   Bilirubin, UA Negative Negative   Urobilinogen, Ur 0.2 0.2 - 1.0 mg/dL   Nitrite, UA Negative Negative   Microscopic Examination See below:       Assessment & Plan:   Problem List Items Addressed This Visit       Cardiovascular and Mediastinum   Hypertension - Primary    Chronic.  Controlled.  Continue with current medication regimen.  Labs ordered today.  Return to clinic in 6 months for reevaluation.  Call sooner if concerns arise.        Relevant Orders   Comp Met (CMET)     Other   Prediabetes    Chronic.  Controlled with diet and exercise.  Labs ordered today.  Return to clinic in 6 months for reevaluation.  Call sooner if concerns arise.        Relevant Orders   HgB A1c   Other Visit Diagnoses     Neck pain       Referral placed for patient to see Neurosurgery at patient's request.    Relevant Orders   Ambulatory referral to Neurosurgery   Screening for colon cancer       Referral placed for GI.   Relevant Orders   Ambulatory referral to Gastroenterology        Follow up plan: Return in about 6 months (around 02/03/2022) for HTN, HLD, DM2 FU.

## 2021-08-06 NOTE — Assessment & Plan Note (Signed)
Chronic.  Controlled.  Continue with current medication regimen.  Labs ordered today.  Return to clinic in 6 months for reevaluation.  Call sooner if concerns arise.  ? ?

## 2021-08-07 LAB — COMPREHENSIVE METABOLIC PANEL
ALT: 21 IU/L (ref 0–32)
AST: 22 IU/L (ref 0–40)
Albumin/Globulin Ratio: 1.8 (ref 1.2–2.2)
Albumin: 4.3 g/dL (ref 3.8–4.8)
Alkaline Phosphatase: 109 IU/L (ref 44–121)
BUN/Creatinine Ratio: 11 — ABNORMAL LOW (ref 12–28)
BUN: 10 mg/dL (ref 8–27)
Bilirubin Total: 0.5 mg/dL (ref 0.0–1.2)
CO2: 27 mmol/L (ref 20–29)
Calcium: 9.5 mg/dL (ref 8.7–10.3)
Chloride: 103 mmol/L (ref 96–106)
Creatinine, Ser: 0.9 mg/dL (ref 0.57–1.00)
Globulin, Total: 2.4 g/dL (ref 1.5–4.5)
Glucose: 81 mg/dL (ref 70–99)
Potassium: 4.3 mmol/L (ref 3.5–5.2)
Sodium: 142 mmol/L (ref 134–144)
Total Protein: 6.7 g/dL (ref 6.0–8.5)
eGFR: 70 mL/min/{1.73_m2} (ref >59)

## 2021-08-07 LAB — HEMOGLOBIN A1C
Est. average glucose Bld gHb Est-mCnc: 111 mg/dL
Hgb A1c MFr Bld: 5.5 % (ref 4.8–5.6)

## 2021-08-09 NOTE — Progress Notes (Signed)
Hi Amanda Ochoa. It was nice to see you last week. Your blood work looks good.  A1c remains well controlled. Please let me know if you have any questions.  I will see you at our next visit.

## 2021-08-23 ENCOUNTER — Telehealth: Payer: Self-pay

## 2021-08-23 NOTE — Telephone Encounter (Signed)
Patient is ready to schedule procedure. Clinical staff will follow up with patient. °

## 2021-09-06 NOTE — Telephone Encounter (Signed)
Patient called again to schedule an appt for her procedure.

## 2021-09-10 DIAGNOSIS — M542 Cervicalgia: Secondary | ICD-10-CM | POA: Diagnosis not present

## 2021-09-10 DIAGNOSIS — M47812 Spondylosis without myelopathy or radiculopathy, cervical region: Secondary | ICD-10-CM | POA: Diagnosis not present

## 2021-09-13 ENCOUNTER — Other Ambulatory Visit: Payer: Self-pay

## 2021-09-13 DIAGNOSIS — Z1211 Encounter for screening for malignant neoplasm of colon: Secondary | ICD-10-CM

## 2021-09-13 MED ORDER — NA SULFATE-K SULFATE-MG SULF 17.5-3.13-1.6 GM/177ML PO SOLN
1.0000 | Freq: Once | ORAL | 0 refills | Status: AC
Start: 2021-09-13 — End: 2021-09-13

## 2021-09-13 NOTE — Telephone Encounter (Signed)
Pt has called several times to get appt scheduled for her colonoscopy. Please advise.Thank you.

## 2021-09-13 NOTE — Progress Notes (Signed)
Gastroenterology Pre-Procedure Review  Request Date: 10/19/2021 Requesting Physician: Dr. Allen Norris  PATIENT REVIEW QUESTIONS: The patient responded to the following health history questions as indicated:    1. Are you having any GI issues? no 2. Do you have a personal history of Polyps?  2015 unsure if polyps were removed. 3. Do you have a family history of Colon Cancer or Polyps? no 4. Diabetes Mellitus? no 5. Joint replacements in the past 12 months?no 6. Major health problems in the past 3 months?no 7. Any artificial heart valves, MVP, or defibrillator?no    MEDICATIONS & ALLERGIES:    Patient reports the following regarding taking any anticoagulation/antiplatelet therapy:   Plavix, Coumadin, Eliquis, Xarelto, Lovenox, Pradaxa, Brilinta, or Effient? yes (Plavix 75 mg) Aspirin? no  Patient confirms/reports the following medications:  Current Outpatient Medications  Medication Sig Dispense Refill   alendronate (FOSAMAX) 70 MG tablet Take 1 tablet (70 mg total) by mouth every 7 (seven) days. Take with a full glass of water on an empty stomach. 4 tablet 11   amLODipine (NORVASC) 10 MG tablet Take 1 tablet (10 mg total) by mouth daily. 90 tablet 1   carvedilol (COREG) 25 MG tablet Take 1 tablet (25 mg total) by mouth 2 (two) times daily with a meal. 180 tablet 0   cholecalciferol (VITAMIN D) 25 MCG (1000 UNIT) tablet Take 1,000 Units by mouth daily.     clopidogrel (PLAVIX) 75 MG tablet Take 1 tablet (75 mg total) by mouth daily. 90 tablet 1   losartan (COZAAR) 50 MG tablet Take 1 tablet (50 mg total) by mouth daily. 90 tablet 1   Multiple Minerals-Vitamins (CALCIUM-MAGNESIUM-ZINC-D3) TABS Take 1 tablet by mouth daily.     Vitamin A 2400 MCG (8000 UT) TABS Take by mouth.     No current facility-administered medications for this visit.    Patient confirms/reports the following allergies:  Allergies  Allergen Reactions   Penicillins Itching and Rash    No orders of the defined types  were placed in this encounter.   AUTHORIZATION INFORMATION Primary Insurance: 1D#: Group #:  Secondary Insurance: 1D#: Group #:  SCHEDULE INFORMATION: Date: 10/19/2021 Time: Location: [place]

## 2021-09-13 NOTE — Telephone Encounter (Signed)
Procedure has been scheduled for 10/19/21.

## 2021-09-17 ENCOUNTER — Other Ambulatory Visit: Payer: Self-pay | Admitting: Neurosurgery

## 2021-09-17 DIAGNOSIS — M542 Cervicalgia: Secondary | ICD-10-CM

## 2021-09-17 DIAGNOSIS — M47812 Spondylosis without myelopathy or radiculopathy, cervical region: Secondary | ICD-10-CM

## 2021-09-28 ENCOUNTER — Ambulatory Visit (INDEPENDENT_AMBULATORY_CARE_PROVIDER_SITE_OTHER): Payer: Medicare HMO | Admitting: Vascular Surgery

## 2021-10-05 ENCOUNTER — Ambulatory Visit
Admission: RE | Admit: 2021-10-05 | Discharge: 2021-10-05 | Disposition: A | Discharge status: Home / Self Care | Payer: 59 | Source: Ambulatory Visit | Attending: Neurosurgery | Admitting: Neurosurgery

## 2021-10-05 DIAGNOSIS — M542 Cervicalgia: Secondary | ICD-10-CM | POA: Insufficient documentation

## 2021-10-05 DIAGNOSIS — M47812 Spondylosis without myelopathy or radiculopathy, cervical region: Secondary | ICD-10-CM | POA: Insufficient documentation

## 2021-10-05 IMAGING — MR MR CERVICAL SPINE W/O CM
5 series · 39 of 48 positions shown · non-contrast
Comparison: None.

CLINICAL DATA: Right neck pain with right shoulder pain

EXAM:
MRI CERVICAL SPINE WITHOUT CONTRAST
TECHNIQUE: Multiplanar, multisequence MR imaging of the cervical spine was
performed. No intravenous contrast was administered.

[Series 5: T2 · sagittal · 3.0mm · 0.62mm/px · 6 of 15 slices shown (1 of 2)]
[im 1/15]
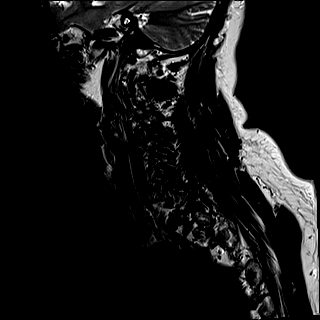
[im 3/15]
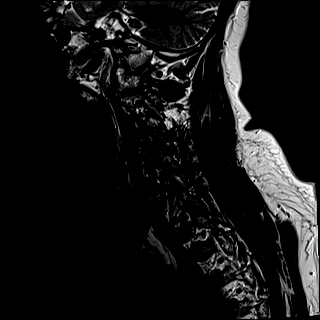
[im 6/15]
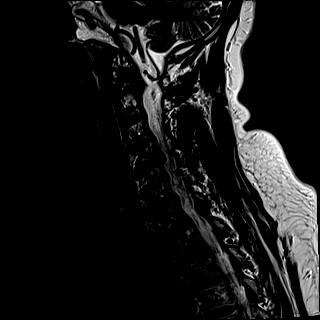
[im 9/15]
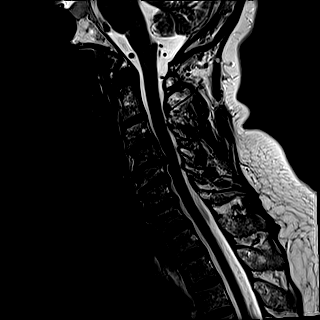
[im 12/15]
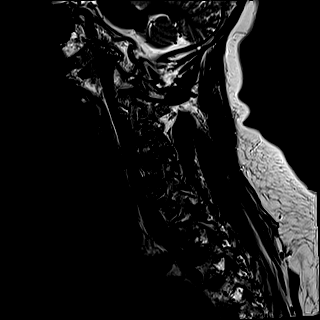
[im 15/15]
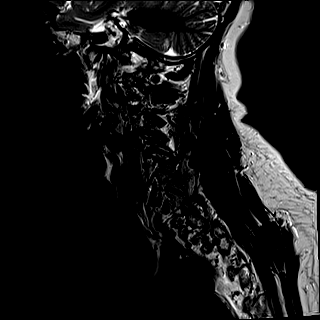

[Series 6: FLAIR · sagittal · 3.0mm · 0.78mm/px · 7 of 15 slices shown]
[im 1/15]
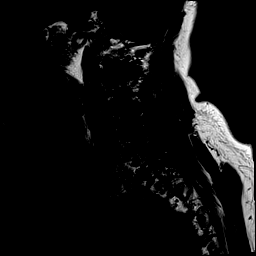
[im 3/15]
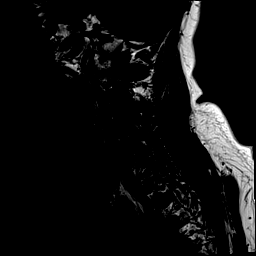
[im 5/15]
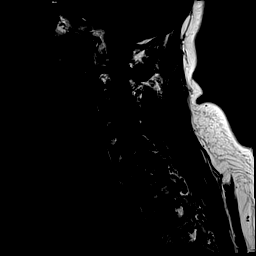
[im 8/15]
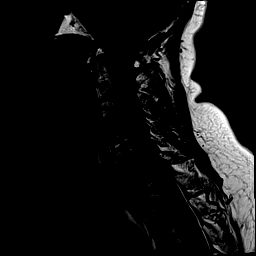
[im 10/15]
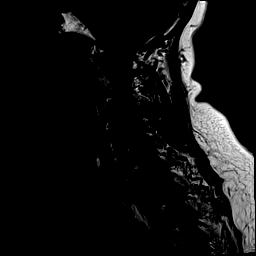
[im 12/15]
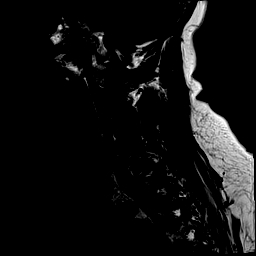
[im 15/15]
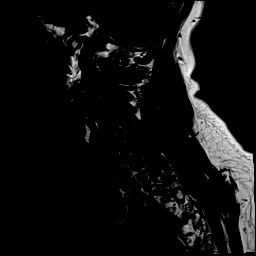

[Series 8: T2 · axial · 3.0mm · 0.70mm/px · z∈[-142,-48]mm · 11 of 29 slices shown (2 of 2)]
[im 1/29]
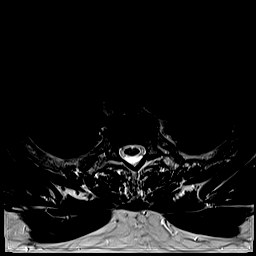
[im 3/29]
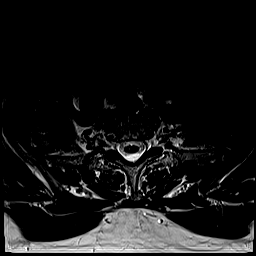
[im 5/29]
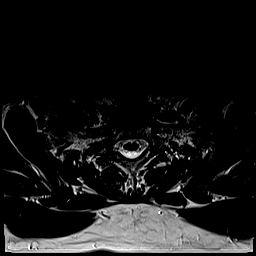
[im 7/29]
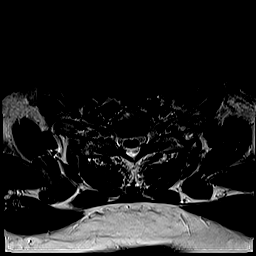
[im 9/29]
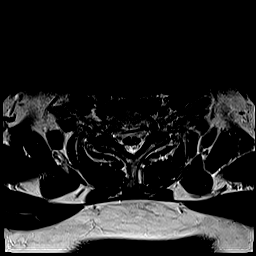
[im 11/29]
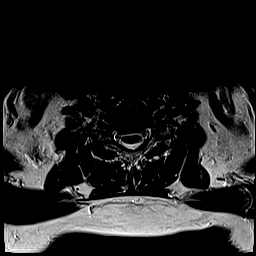
[im 13/29]
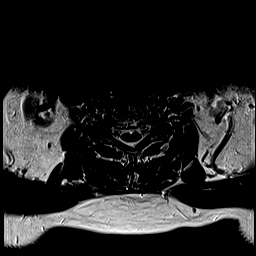
[im 16/29]
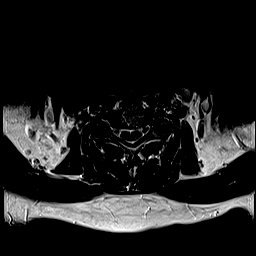
[im 20/29]
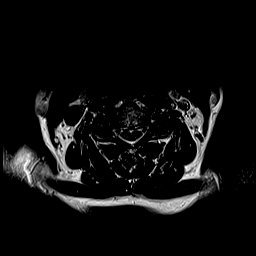
[im 24/29]
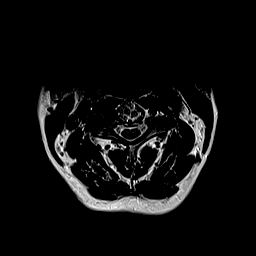
[im 29/29]
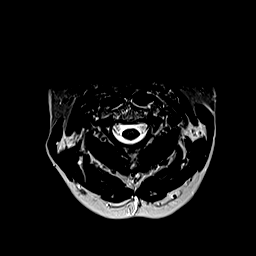

[Series 9: STIR · sagittal · 3.0mm · 0.62mm/px · 7 of 15 slices shown]
[im 1/15]
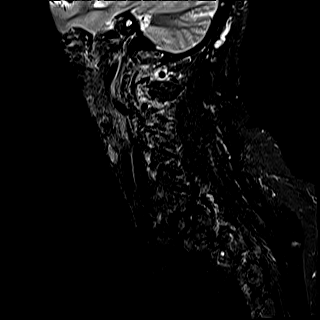
[im 3/15]
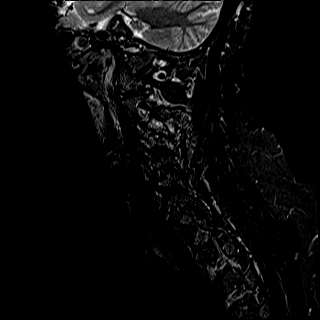
[im 5/15]
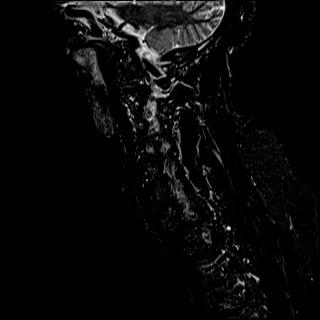
[im 8/15]
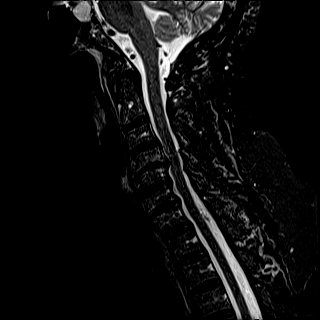
[im 10/15]
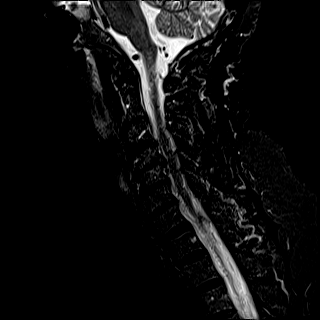
[im 12/15]
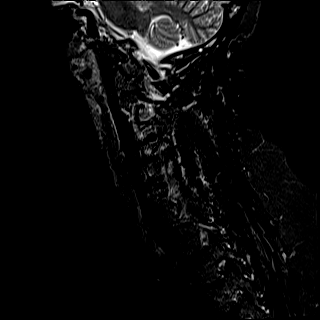
[im 15/15]
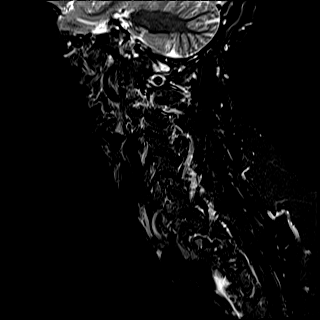

[Series 10: ax mpgr · axial · 3.0mm · 0.35mm/px · z∈[-142,-47]mm · 8 of 29 slices shown]
[im 1/29]
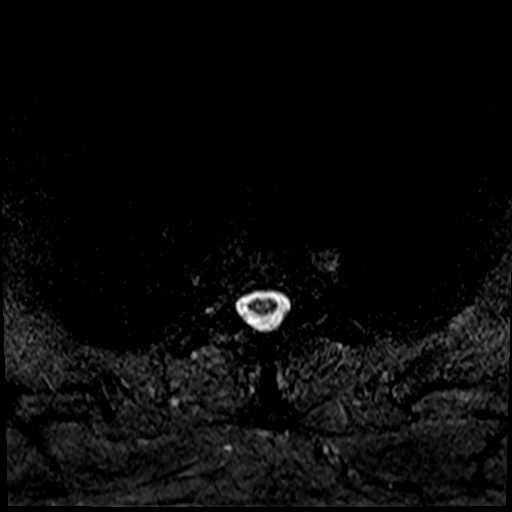
[im 5/29]
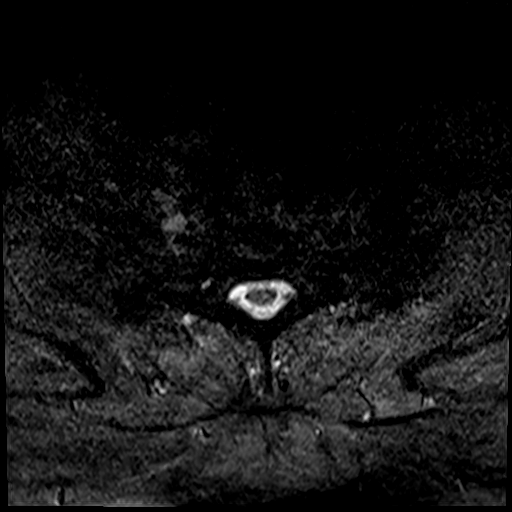
[im 9/29]
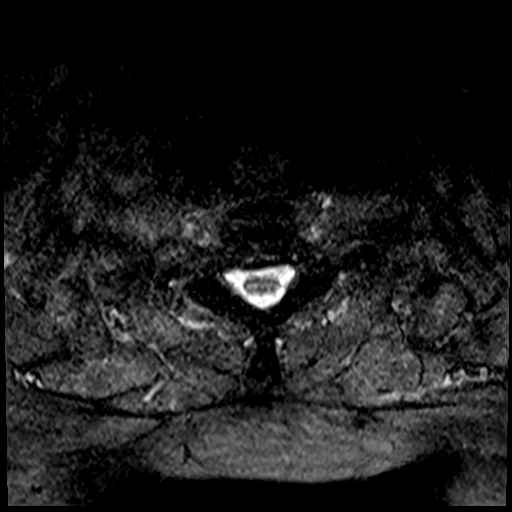
[im 13/29]
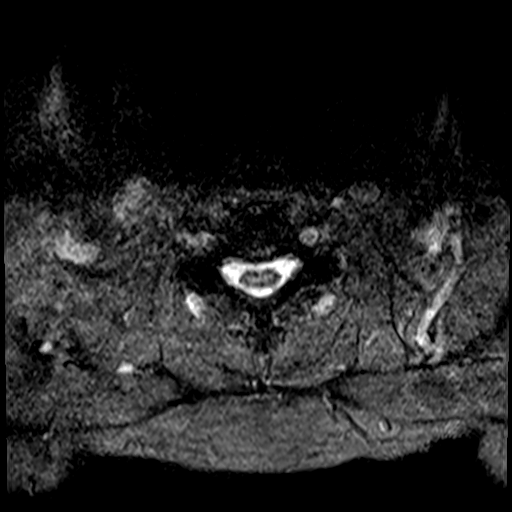
[im 16/29]
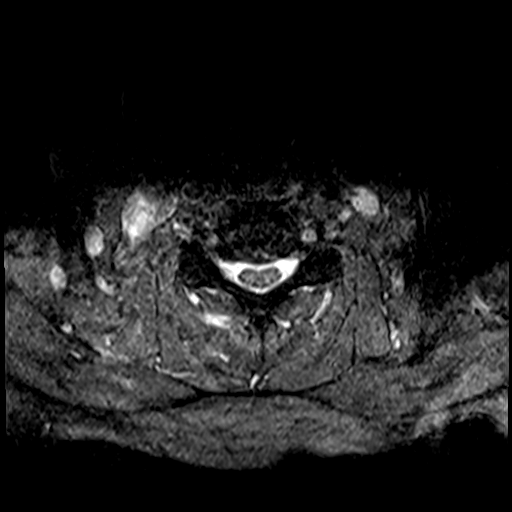
[im 20/29]
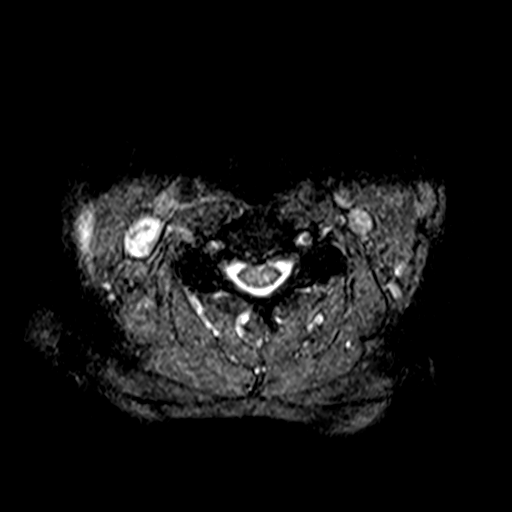
[im 24/29]
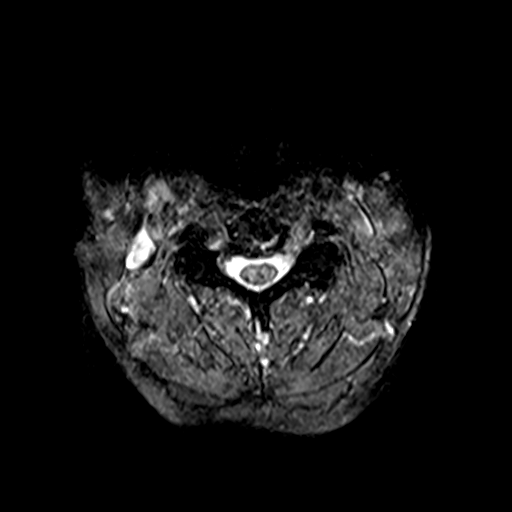
[im 29/29]
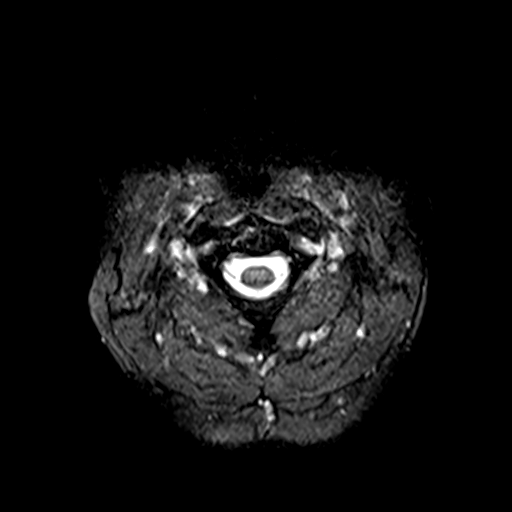

[39 of 48 positions shown; findings below may reference images not displayed]

FINDINGS: Alignment: Mild anterolisthesis C3-4. Straightening of the cervical
lordosis.

Vertebrae: Negative for fracture or mass

Cord: Normal signal and morphology.

Posterior Fossa, vertebral arteries, paraspinal tissues: Pituitary
appears enlarged measuring 10.5 cm in height. No prior studies for
comparison.

Disc levels:

C2-3: Negative

C3-4: Mild disc degeneration. Moderate facet hypertrophy. Mild right
foraminal stenosis.

C4-5: Disc degeneration with prominent diffuse uncinate spurring.
Cord flattening with mild spinal stenosis. Moderate foraminal
encroachment bilaterally

C5-6: Disc degeneration with diffuse uncinate spurring. Mild spinal
stenosis. Mild foraminal narrowing bilaterally

C6-7: Disc degeneration with diffuse disc bulging and mild uncinate
spurring. Mild cord flattening on the right. No significant spinal
or foraminal stenosis

C7-T1: Negative for stenosis.
IMPRESSION: Multilevel cervical spondylosis. Bilateral foraminal encroachment at
multiple levels due to spurring. Mild spinal stenosis at C4-5 and
C5-6.

Mild pituitary enlargement, incompletely evaluated. Correlate with
pituitary function tests. Recommend MRI brain without and with
contrast with pituitary protocol.

## 2021-10-13 ENCOUNTER — Telehealth: Payer: Self-pay

## 2021-10-13 NOTE — Telephone Encounter (Signed)
Patient verbalized she has not her taken Plavix today nor yesterday. She knew to stop it 7 days prior to procedure. Informed she may take her coreg the morning of procedure with a sip of water. Patient verbalized understanding. Blood thinner clearance will be scanned into chart.

## 2021-10-18 ENCOUNTER — Other Ambulatory Visit: Payer: Self-pay | Admitting: Neurosurgery

## 2021-10-18 DIAGNOSIS — E236 Other disorders of pituitary gland: Secondary | ICD-10-CM

## 2021-10-19 ENCOUNTER — Encounter
Admission: RE | Disposition: A | Discharge status: Home / Self Care | Payer: Self-pay | Source: Home / Self Care | Attending: Gastroenterology

## 2021-10-19 ENCOUNTER — Ambulatory Visit: Payer: 59 | Admitting: Registered Nurse

## 2021-10-19 ENCOUNTER — Encounter: Payer: Self-pay | Admitting: Gastroenterology

## 2021-10-19 ENCOUNTER — Ambulatory Visit
Admission: RE | Admit: 2021-10-19 | Discharge: 2021-10-19 | Disposition: A | Discharge status: Home / Self Care | Payer: 59 | Attending: Gastroenterology | Admitting: Gastroenterology

## 2021-10-19 DIAGNOSIS — K641 Second degree hemorrhoids: Secondary | ICD-10-CM | POA: Diagnosis not present

## 2021-10-19 DIAGNOSIS — D122 Benign neoplasm of ascending colon: Secondary | ICD-10-CM | POA: Insufficient documentation

## 2021-10-19 DIAGNOSIS — I10 Essential (primary) hypertension: Secondary | ICD-10-CM | POA: Insufficient documentation

## 2021-10-19 DIAGNOSIS — K635 Polyp of colon: Secondary | ICD-10-CM

## 2021-10-19 DIAGNOSIS — D124 Benign neoplasm of descending colon: Secondary | ICD-10-CM | POA: Diagnosis not present

## 2021-10-19 DIAGNOSIS — K219 Gastro-esophageal reflux disease without esophagitis: Secondary | ICD-10-CM | POA: Insufficient documentation

## 2021-10-19 DIAGNOSIS — Z955 Presence of coronary angioplasty implant and graft: Secondary | ICD-10-CM | POA: Diagnosis not present

## 2021-10-19 DIAGNOSIS — G4730 Sleep apnea, unspecified: Secondary | ICD-10-CM | POA: Diagnosis not present

## 2021-10-19 DIAGNOSIS — I2510 Atherosclerotic heart disease of native coronary artery without angina pectoris: Secondary | ICD-10-CM | POA: Insufficient documentation

## 2021-10-19 DIAGNOSIS — K5730 Diverticulosis of large intestine without perforation or abscess without bleeding: Secondary | ICD-10-CM | POA: Diagnosis not present

## 2021-10-19 DIAGNOSIS — Z1211 Encounter for screening for malignant neoplasm of colon: Secondary | ICD-10-CM | POA: Diagnosis present

## 2021-10-19 HISTORY — PX: COLONOSCOPY WITH PROPOFOL: SHX5780

## 2021-10-19 SURGERY — COLONOSCOPY WITH PROPOFOL
Anesthesia: General

## 2021-10-19 MED ORDER — LIDOCAINE HCL (PF) 2 % IJ SOLN
INTRAMUSCULAR | Status: AC
  Filled 2021-10-19: qty 5

## 2021-10-19 MED ORDER — PROPOFOL 500 MG/50ML IV EMUL
INTRAVENOUS | Status: AC
  Filled 2021-10-19: qty 50

## 2021-10-19 MED ORDER — LIDOCAINE HCL (CARDIAC) PF 100 MG/5ML IV SOSY
PREFILLED_SYRINGE | INTRAVENOUS | Status: DC | PRN
  Administered 2021-10-19: 60 mg via INTRAVENOUS

## 2021-10-19 MED ORDER — PROPOFOL 500 MG/50ML IV EMUL
INTRAVENOUS | Status: DC | PRN
Start: 2021-10-19 — End: 2021-10-19
  Administered 2021-10-19: 140 ug/kg/min via INTRAVENOUS

## 2021-10-19 MED ORDER — ONDANSETRON HCL 4 MG/2ML IJ SOLN
INTRAMUSCULAR | Status: AC
  Filled 2021-10-19: qty 2

## 2021-10-19 MED ORDER — SODIUM CHLORIDE 0.9 % IV SOLN
INTRAVENOUS | Status: DC
  Administered 2021-10-19: 1000 mL via INTRAVENOUS

## 2021-10-19 MED ORDER — PROPOFOL 10 MG/ML IV BOLUS
INTRAVENOUS | Status: DC | PRN
  Administered 2021-10-19: 70 mg via INTRAVENOUS

## 2021-10-19 NOTE — Op Note (Signed)
[facility] Gastroenterology Patient Name: Amanda Ochoa Procedure Date: 10/19/2021 9:23 AM MRN: [id number] Account #: [id number] Date of Birth: [birth year] Admit Type: Outpatient Age: 71 Room: [facility] ENDO ROOM 4 Gender: Female Note Status: Finalized Instrument Name: Jasper Riling [id number] Procedure:             Colonoscopy Indications:           Screening for colorectal malignant neoplasm Providers:             Lucilla Lame MD, MD Referring MD:          Jon Billings (Referring MD) Medicines:             Propofol per Anesthesia Complications:         No immediate complications. Procedure:             Pre-Anesthesia Assessment:                        - Prior to the procedure, a History and Physical was                         performed, and patient medications and allergies were                         reviewed. The patient's tolerance of previous                         anesthesia was also reviewed. The risks and benefits                         of the procedure and the sedation options and risks                         were discussed with the patient. All questions were                         answered, and informed consent was obtained. Prior                         Anticoagulants: The patient has taken no previous                         anticoagulant or antiplatelet agents. ASA Grade                         Assessment: II - A patient with mild systemic disease.                         After reviewing the risks and benefits, the patient                         was deemed in satisfactory condition to undergo the                         procedure.                        After obtaining informed consent, the colonoscope was  passed under direct vision. Throughout the procedure,                         the patient's blood pressure, pulse, and oxygen                         saturations were monitored continuously. The                          Colonoscope was introduced through the anus and                         advanced to the the cecum, identified by appendiceal                         orifice and ileocecal valve. The colonoscopy was                         performed without difficulty. The patient tolerated                         the procedure well. The quality of the bowel                         preparation was excellent. Findings:      The perianal and digital rectal examinations were normal.      A 4 mm polyp was found in the ascending colon. The polyp was sessile.       The polyp was removed with a cold snare. Resection and retrieval were       complete.      A 4 mm polyp was found in the descending colon. The polyp was sessile.       The polyp was removed with a cold snare. Resection and retrieval were       complete.      Non-bleeding internal hemorrhoids were found during retroflexion. The       hemorrhoids were Grade II (internal hemorrhoids that prolapse but reduce       spontaneously).      Multiple small-mouthed diverticula were found in the sigmoid colon. Impression:            - One 4 mm polyp in the ascending colon, removed with                         a cold snare. Resected and retrieved.                        - One 4 mm polyp in the descending colon, removed with                         a cold snare. Resected and retrieved.                        - Non-bleeding internal hemorrhoids.                        - Diverticulosis in the sigmoid colon. Recommendation:        - Discharge patient to home.                        -  Resume previous diet.                        - Continue present medications.                        - Await pathology results.                        - If the pathology report reveals adenomatous tissue,                         then repeat the colonoscopy for surveillance in 7                         years. Procedure Code(s):     --- Professional ---                        [phone],  Colonoscopy, flexible; with removal of                         tumor(s), polyp(s), or other lesion(s) by snare                         technique Diagnosis Code(s):     --- Professional ---                        Z12.11, Encounter for screening for malignant neoplasm                         of colon                        K63.5, Polyp of colon CPT copyright 2019 American Medical Association. All rights reserved. The codes documented in this report are preliminary and upon coder review may  be revised to meet current compliance requirements. Lucilla Lame MD, MD 10/19/2021 9:49:42 AM This report has been signed electronically. Number of Addenda: 0 Note Initiated On: 10/19/2021 9:23 AM Scope Withdrawal Time: 0 hours 12 minutes 46 seconds  Total Procedure Duration: 0 hours 18 minutes 29 seconds       [facility]

## 2021-10-19 NOTE — H&P (Signed)
Amanda Lame, MD [facility] [address], [place], [place] [address] Phone: [phone] Fax : [phone]  Primary Care Physician:  Jon Billings, NP Primary Gastroenterologist:  Dr. Allen Norris  Pre-Procedure History & Physical: HPI:  Amanda Ochoa is a 71 y.o. female is here for a screening colonoscopy.   Past Medical History:  Diagnosis Date   Abdominal aneurysm    Diverticulitis    GERD (gastroesophageal reflux disease)    Hypertension    Sciatica    Sleep apnea     Past Surgical History:  Procedure Laterality Date   ABDOMINAL HYSTERECTOMY     CORONARY ANGIOPLASTY WITH STENT PLACEMENT     TOTAL ABDOMINAL HYSTERECTOMY W/ BILATERAL SALPINGOOPHORECTOMY      Prior to Admission medications   Medication Sig Start Date End Date Taking? Authorizing Provider  alendronate (FOSAMAX) 70 MG tablet Take 1 tablet (70 mg total) by mouth every 7 (seven) days. Take with a full glass of water on an empty stomach. 07/01/21  Yes McElwee, Lauren A, NP  amLODipine (NORVASC) 10 MG tablet Take 1 tablet (10 mg total) by mouth daily. 06/10/21  Yes Jon Billings, NP  carvedilol (COREG) 25 MG tablet Take 1 tablet (25 mg total) by mouth 2 (two) times daily with a meal. 07/02/21  Yes Jon Billings, NP  cholecalciferol (VITAMIN D) 25 MCG (1000 UNIT) tablet Take 1,000 Units by mouth daily.   Yes [provider]  losartan (COZAAR) 50 MG tablet Take 1 tablet (50 mg total) by mouth daily. 06/10/21  Yes Jon Billings, NP  Multiple Minerals-Vitamins (CALCIUM-MAGNESIUM-ZINC-D3) TABS Take 1 tablet by mouth daily.   Yes [provider]  Vitamin A 2400 MCG (8000 UT) TABS Take by mouth.   Yes [provider]  clopidogrel (PLAVIX) 75 MG tablet Take 1 tablet (75 mg total) by mouth daily. 06/10/21   Jon Billings, NP    Allergies as of 09/13/2021 - Review Complete 08/06/2021  Allergen Reaction Noted   Penicillins Itching and Rash 05/06/2021    Family History  Problem  Relation Age of Onset   Diabetes Mother    Healthy Brother    Healthy Daughter    Healthy Son    Diabetes Maternal Grandmother    Heart disease Maternal Grandmother    Healthy Maternal Grandfather     Social History   Socioeconomic History   Marital status: Single    Spouse name: Not on file   Number of children: Not on file   Years of education: Not on file   Highest education level: Not on file  Occupational History   Not on file  Tobacco Use   Smoking status: Never   Smokeless tobacco: Never  Vaping Use   Vaping Use: Never used  Substance and Sexual Activity   Alcohol use: Never   Drug use: Never   Sexual activity: Not on file  Other Topics Concern   Not on file  Social History Narrative   Not on file   Social Determinants of Health   Financial Resource Strain: Not on file  Food Insecurity: Not on file  Transportation Needs: Not on file  Physical Activity: Not on file  Stress: Not on file  Social Connections: Not on file  Intimate Partner Violence: Not on file    Review of Systems: See HPI, otherwise negative ROS  Physical Exam: BP 113/77    Pulse (!) 52    Temp (!) 96.7 F (35.9 C) (Temporal)    Resp 16  Ht 5\' 4"  (1.626 m)    Wt 90 kg    SpO2 100%    BMI 34.06 kg/m  General:   Alert,  pleasant and cooperative in NAD Head:  Normocephalic and atraumatic. Neck:  Supple; no masses or thyromegaly. Lungs:  Clear throughout to auscultation.    Heart:  Regular rate and rhythm. Abdomen:  Soft, nontender and nondistended. Normal bowel sounds, without guarding, and without rebound.   Neurologic:  Alert and  oriented x4;  grossly normal neurologically.  Impression/Plan: Amanda Ochoa is now here to undergo a screening colonoscopy.  Risks, benefits, and alternatives regarding colonoscopy have been reviewed with the patient.  Questions have been answered.  All parties agreeable.

## 2021-10-19 NOTE — Transfer of Care (Signed)
Immediate Anesthesia Transfer of Care Note  Patient: Amanda Ochoa  Procedure(s) Performed: COLONOSCOPY WITH PROPOFOL  Patient Location: PACU  Anesthesia Type:General  Level of Consciousness: awake and alert   Airway & Oxygen Therapy: Patient Spontanous Breathing  Post-op Assessment: Report given to RN and Post -op Vital signs reviewed and stable  Post vital signs: Reviewed and stable  Last Vitals:  Vitals Value Taken Time  BP 96/68 10/19/21 0951  Temp 35.9 C 10/19/21 0950  Pulse 58 10/19/21 0951  Resp 14 10/19/21 0951  SpO2 99 % 10/19/21 0951  Vitals shown include unvalidated device data.  Last Pain:  Vitals:   10/19/21 0950  TempSrc: Temporal  PainSc:          Complications: No notable events documented.

## 2021-10-19 NOTE — Anesthesia Preprocedure Evaluation (Signed)
Anesthesia Evaluation  Patient identified by MRN, date of birth, ID band Patient awake    Reviewed: Allergy & Precautions, H&P , NPO status , Patient's Chart, lab work & pertinent test results, reviewed documented beta blocker date and time   History of Anesthesia Complications Negative for: history of anesthetic complications  Airway Mallampati: II  TM Distance: >3 FB Neck ROM: full    Dental  (+) Dental Advidsory Given, Missing, Teeth Intact   Pulmonary neg shortness of breath, sleep apnea and Continuous Positive Airway Pressure Ventilation , neg COPD, neg recent URI,    Pulmonary exam normal breath sounds clear to auscultation       Cardiovascular Exercise Tolerance: Good hypertension, (-) angina+ CAD and + Cardiac Stents  (-) Past MI and (-) CABG Normal cardiovascular exam+ dysrhythmias + Valvular Problems/Murmurs MVP  Rhythm:regular Rate:Normal     Neuro/Psych negative neurological ROS  negative psych ROS   GI/Hepatic Neg liver ROS, GERD  ,  Endo/Other  negative endocrine ROS  Renal/GU negative Renal ROS  negative genitourinary   Musculoskeletal   Abdominal   Peds  Hematology negative hematology ROS (+)   Anesthesia Other Findings Past Medical History: No date: Abdominal aneurysm No date: Diverticulitis No date: GERD (gastroesophageal reflux disease) No date: Hypertension No date: Sciatica No date: Sleep apnea   Reproductive/Obstetrics negative OB ROS                             Anesthesia Physical Anesthesia Plan  ASA: 3  Anesthesia Plan: General   Post-op Pain Management:    Induction: Intravenous  PONV Risk Score and Plan: 3 and Propofol infusion and TIVA  Airway Management Planned: Natural Airway and Nasal Cannula  Additional Equipment:   Intra-op Plan:   Post-operative Plan:   Informed Consent: I have reviewed the patients History and Physical, chart, labs  and discussed the procedure including the risks, benefits and alternatives for the proposed anesthesia with the patient or authorized representative who has indicated his/her understanding and acceptance.     Dental Advisory Given  Plan Discussed with: Anesthesiologist, CRNA and Surgeon  Anesthesia Plan Comments:         Anesthesia Quick Evaluation

## 2021-10-20 ENCOUNTER — Telehealth: Payer: Self-pay | Admitting: Nurse Practitioner

## 2021-10-20 ENCOUNTER — Encounter: Payer: Self-pay | Admitting: Gastroenterology

## 2021-10-20 DIAGNOSIS — Z1231 Encounter for screening mammogram for malignant neoplasm of breast: Secondary | ICD-10-CM

## 2021-10-20 LAB — SURGICAL PATHOLOGY

## 2021-10-20 NOTE — Telephone Encounter (Signed)
Please let patient know that the MRI of the spine that she had done showed pituitary enlargement. The MRI wasn't able to evaluate it completely. The radiologist recommends a MRI of the brain.  I seen the [facility] has this ordered for her. Can you please ask her if they have ordered lab work to evaluate her pituitary gland? If not, We will need to do it in our office.

## 2021-10-20 NOTE — Addendum Note (Signed)
Addended by: Louanna Raw on: 10/20/2021 04:06 PM   Modules accepted: Orders

## 2021-10-20 NOTE — Telephone Encounter (Signed)
Patient states she is going to [facility] for her pituitary enlargement.

## 2021-10-20 NOTE — Telephone Encounter (Signed)
Patient is also requesting order for mammogram.

## 2021-10-21 ENCOUNTER — Encounter: Payer: Self-pay | Admitting: Gastroenterology

## 2021-10-21 NOTE — Addendum Note (Signed)
Addended by: Jon Billings on: 10/21/2021 07:59 AM   Modules accepted: Orders

## 2021-10-25 ENCOUNTER — Other Ambulatory Visit: Payer: Self-pay | Admitting: Nurse Practitioner

## 2021-10-25 DIAGNOSIS — Z1231 Encounter for screening mammogram for malignant neoplasm of breast: Secondary | ICD-10-CM

## 2021-10-25 NOTE — Anesthesia Postprocedure Evaluation (Signed)
Anesthesia Post Note  Patient: Amanda Ochoa  Procedure(s) Performed: COLONOSCOPY WITH PROPOFOL  Patient location during evaluation: Endoscopy Anesthesia Type: General Level of consciousness: awake and alert Pain management: pain level controlled Vital Signs Assessment: post-procedure vital signs reviewed and stable Respiratory status: spontaneous breathing, nonlabored ventilation, respiratory function stable and patient connected to nasal cannula oxygen Cardiovascular status: blood pressure returned to baseline and stable Postop Assessment: no apparent nausea or vomiting Anesthetic complications: no   No notable events documented.   Last Vitals:  Vitals:   10/19/21 1000 10/19/21 1010  BP: (!) 89/69 (!) 109/59  Pulse: (!) 54 (!) 44  Resp: 12 11  Temp:    SpO2: 100% 100%    Last Pain:  Vitals:   10/19/21 0950  TempSrc: Temporal  PainSc:                  Martha Clan

## 2021-10-28 ENCOUNTER — Ambulatory Visit
Admission: RE | Admit: 2021-10-28 | Discharge: 2021-10-28 | Disposition: A | Discharge status: Home / Self Care | Payer: 59 | Source: Ambulatory Visit | Attending: Neurosurgery | Admitting: Neurosurgery

## 2021-10-28 DIAGNOSIS — E236 Other disorders of pituitary gland: Secondary | ICD-10-CM | POA: Diagnosis present

## 2021-10-28 IMAGING — MR MR HEAD WO/W CM
12 of 18 series · 24 of 48 positions shown · IV contrast (9ml Gadavist)
Comparison: None.

CLINICAL DATA: Pituitary mass

EXAM:
MRI HEAD WITHOUT AND WITH CONTRAST
TECHNIQUE: Multiplanar, multiecho pulse sequences of the brain and surrounding
structures were obtained without and with intravenous contrast.
CONTRAST:  9mL GADAVIST GADOBUTROL 1 MMOL/ML IV SOLN

[Series 8: T2 · axial · 5.0mm · 0.53mm/px · z∈[-119,+31]mm · 2 of 26 slices shown (1 of 2)]
[im 1/26]
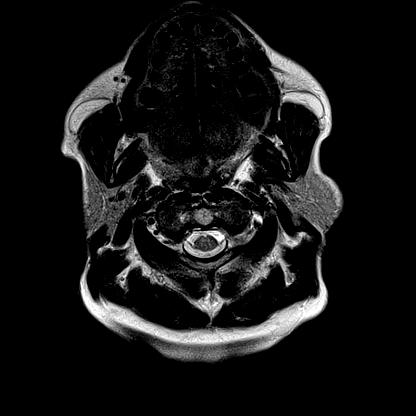
[im 26/26]
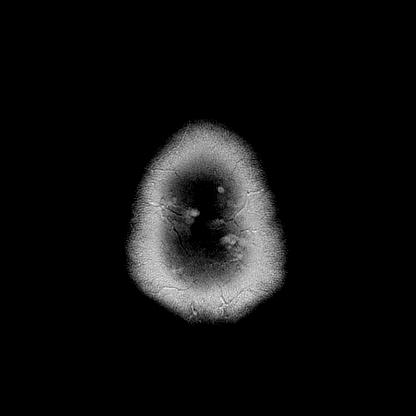

[Series 10: FLAIR · axial · 3.0mm · 0.53mm/px · z∈[-125,+37]mm · 5 of 55 slices shown]
[im 1/55]
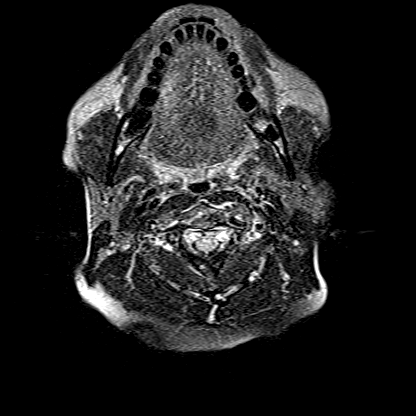
[im 14/55]
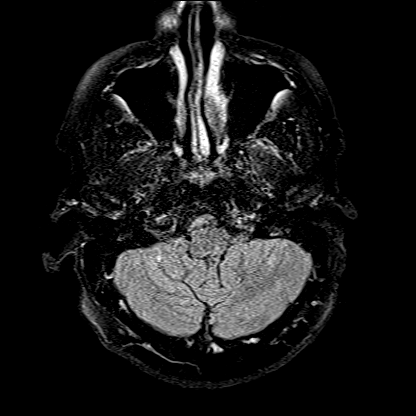
[im 28/55]
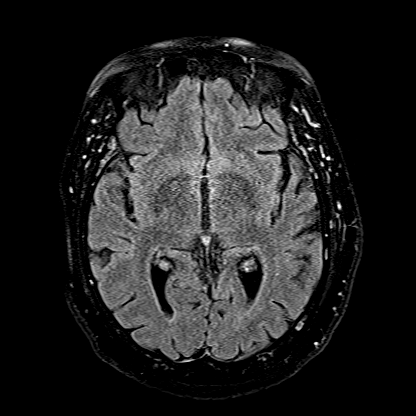
[im 41/55]
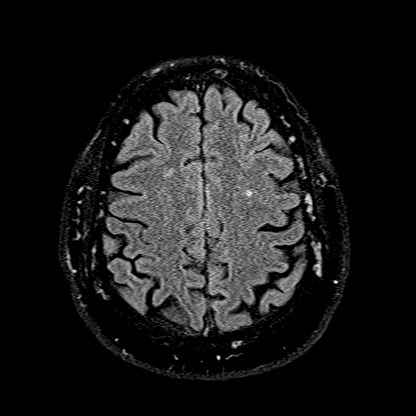
[im 55/55]
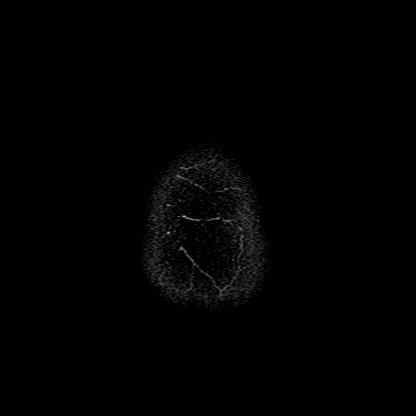

[Series 14: T2 · coronal · 3.0mm · 0.21mm/px · 1 of 13 slices shown (2 of 2)]
[im 1/13]
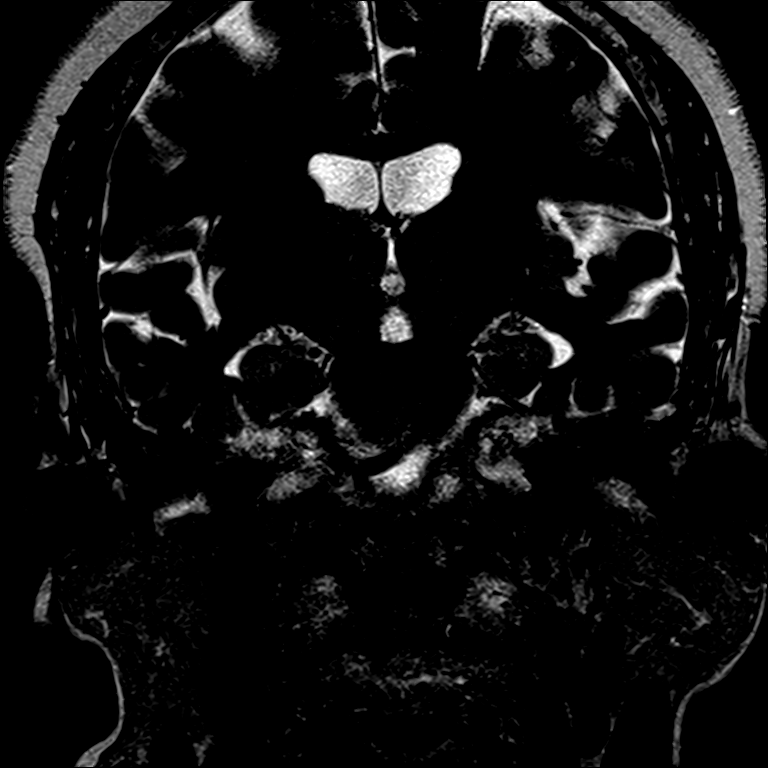

[Series 15: T1 post-contrast · coronal · 3.0mm · 0.28mm/px · 1 of 11 slices shown (1 of 9)]
[im 1/11]
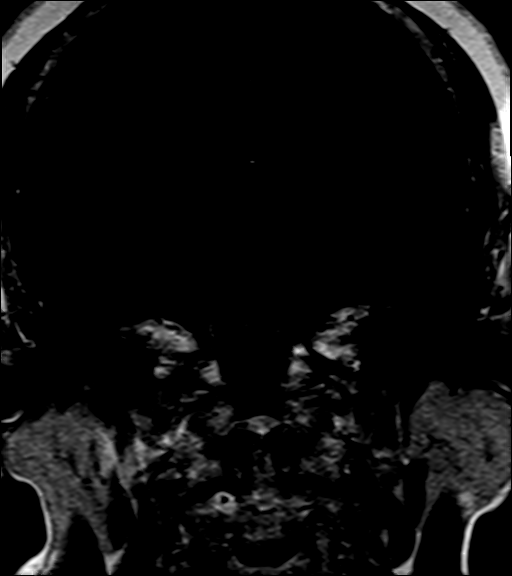

[Series 16: T1 post-contrast · coronal · 3.0mm · 0.28mm/px · 1 of 11 slices shown (2 of 9)]
[im 1/11]
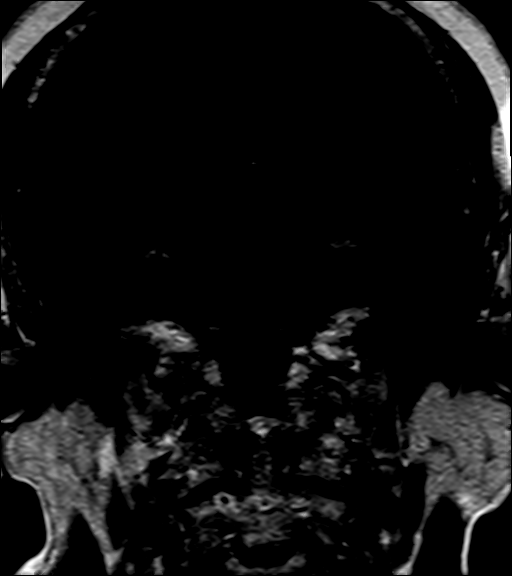

[Series 17: T1 post-contrast · coronal · 3.0mm · 0.28mm/px · 1 of 11 slices shown (3 of 9)]
[im 1/11]
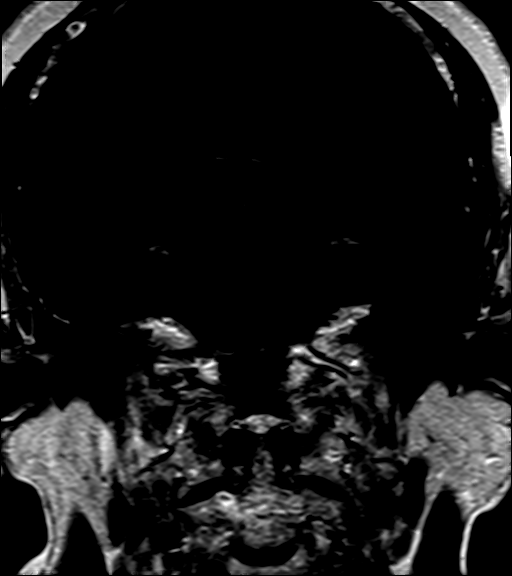

[Series 18: T1 post-contrast · coronal · 3.0mm · 0.28mm/px · 1 of 11 slices shown (4 of 9)]
[im 1/11]
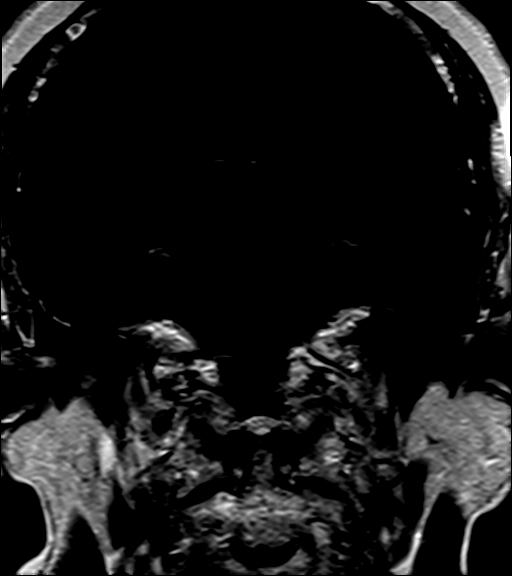

[Series 19: T1 post-contrast · coronal · 3.0mm · 0.28mm/px · 1 of 11 slices shown (5 of 9)]
[im 1/11]
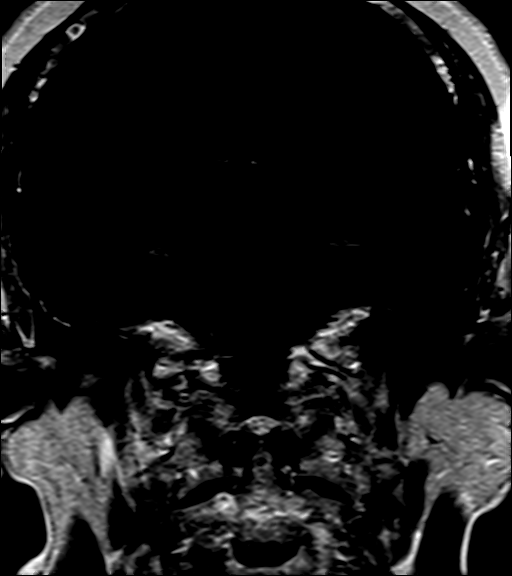

[Series 20: T1 post-contrast · coronal · 3.0mm · 0.28mm/px · 1 of 11 slices shown (6 of 9)]
[im 1/11]
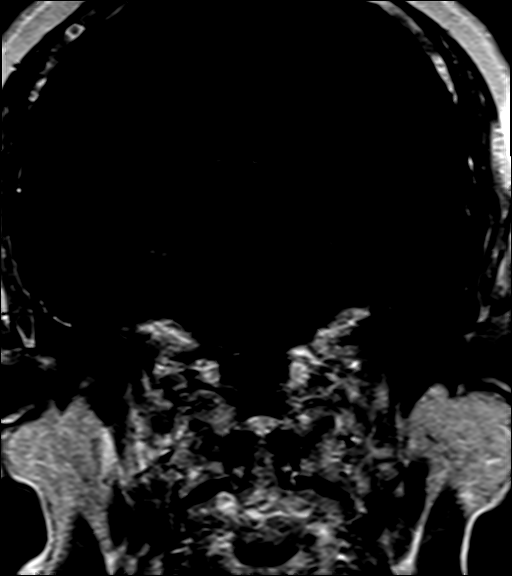

[Series 21: T1 post-contrast · coronal · 3.0mm · 0.21mm/px · 1 of 13 slices shown (7 of 9)]
[im 1/13]
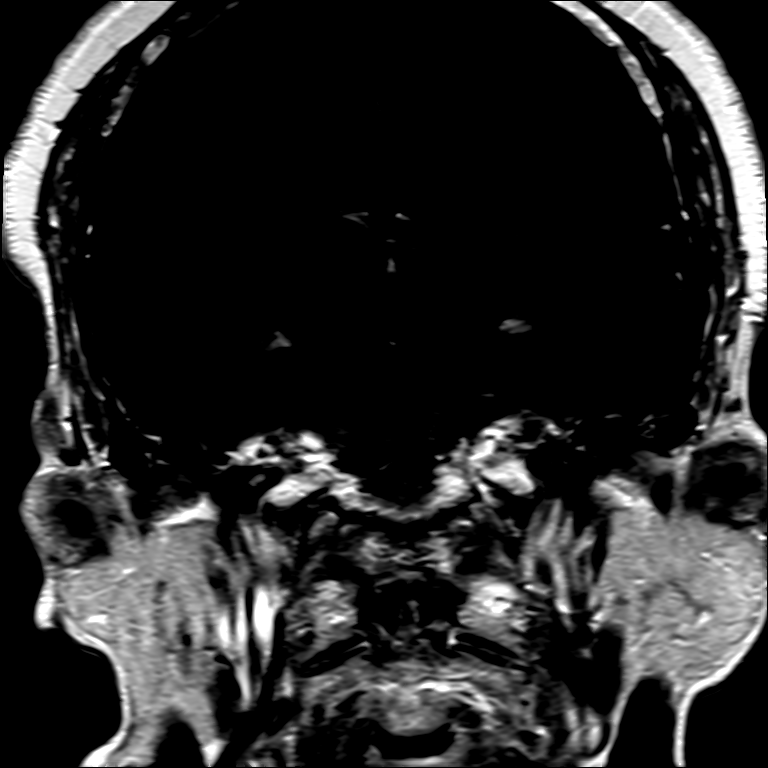

[Series 22: T1 post-contrast · sagittal · 3.0mm · 0.21mm/px · 1 of 13 slices shown (8 of 9)]
[im 1/13]
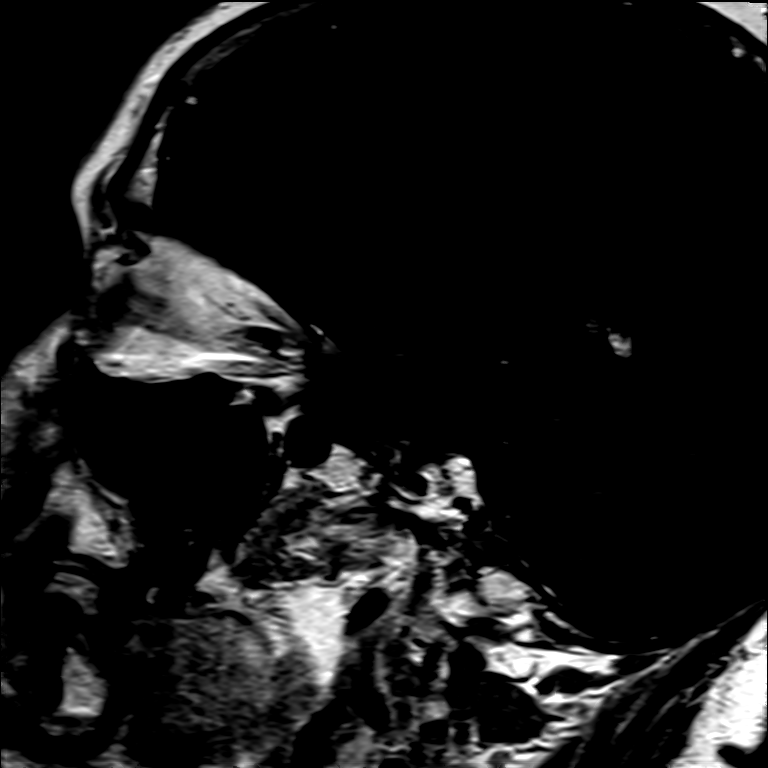

[Series 23: T1 post-contrast · axial · 1.0mm · 0.98mm/px · z∈[-119,+45]mm · 8 of 176 slices shown (9 of 9)]
[im 12/176]
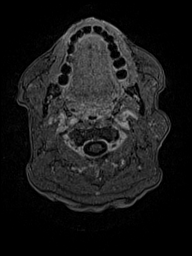
[im 36/176]
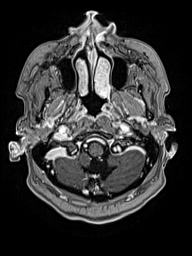
[im 59/176]
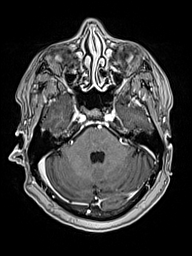
[im 82/176]
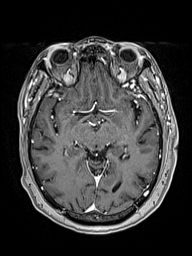
[im 106/176]
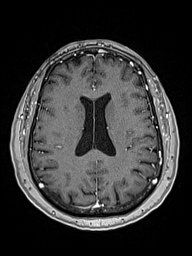
[im 129/176]
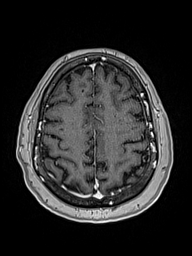
[im 152/176]
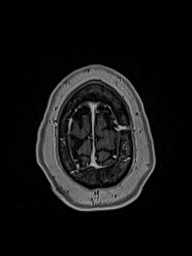
[im 176/176]
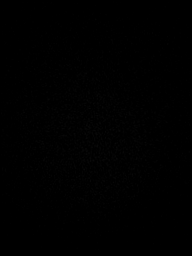

[24 of 48 positions shown; findings below may reference images not displayed]

FINDINGS: Brain: No acute infarct, mass effect or extra-axial collection. No
acute or chronic hemorrhage. Normal white matter signal, parenchymal
volume and CSF spaces.

Pituitary/Sella: There is a hypoenhancing nodule of the right aspect
of the pituitary gland that measures 1.5 x 1.0 x 1.0 cm. There is
mild leftward deviation of the infundibulum. The hypothalamus and
mamillary bodies are normal. There is no mass effect on the optic
chiasm or optic nerves. The infundibular and chiasmatic recesses are
clear. Normal cavernous sinus and cavernous internal carotid artery
flow voids.

Vascular: Major flow voids are preserved.

Skull and upper cervical spine: Normal calvarium and skull base.
Visualized upper cervical spine and soft tissues are normal.

Sinuses/Orbits:No paranasal sinus fluid levels or advanced mucosal
thickening. No mastoid or middle ear effusion. Normal orbits.
IMPRESSION: 1. Hypoenhancing nodule of the right aspect of the pituitary gland
that measures 1.5 x 1.0 x 1.0 cm, consistent with pituitary
macroadenoma.
2. No mass effect on the optic chiasm or optic nerves.

## 2021-10-28 MED ORDER — GADOBUTROL 1 MMOL/ML IV SOLN
9.0000 mL | Freq: Once | INTRAVENOUS | Status: AC | PRN
  Administered 2021-10-28: 9 mL via INTRAVENOUS

## 2021-11-03 ENCOUNTER — Other Ambulatory Visit: Payer: Self-pay | Admitting: Nurse Practitioner

## 2021-11-03 MED ORDER — CARVEDILOL 25 MG PO TABS: 25.0000 mg | ORAL_TABLET | Freq: Two times a day (BID) | ORAL | 1 refills | Status: DC

## 2021-11-03 NOTE — Telephone Encounter (Signed)
Requested Prescriptions  Pending Prescriptions Disp Refills   carvedilol (COREG) 25 MG tablet 180 tablet 1    Sig: Take 1 tablet (25 mg total) by mouth 2 (two) times daily with a meal.     Cardiovascular: Beta Blockers 3 Failed - 11/03/2021  2:16 PM      Failed - Last Heart Rate in normal range    Pulse Readings from Last 1 Encounters:  10/19/21 (!) 44         Passed - Cr in normal range and within 360 days    Creatinine, Ser  Date Value Ref Range Status  08/06/2021 0.90 0.57 - 1.00 mg/dL Final         Passed - AST in normal range and within 360 days    AST  Date Value Ref Range Status  08/06/2021 22 0 - 40 IU/L Final         Passed - ALT in normal range and within 360 days    ALT  Date Value Ref Range Status  08/06/2021 21 0 - 32 IU/L Final         Passed - Last BP in normal range    BP Readings from Last 1 Encounters:  10/19/21 (!) 109/59         Passed - Valid encounter within last 6 months    Recent Outpatient Visits          2 months ago Primary hypertension   [facility] Jon Billings, NP   4 months ago Urinary frequency   [place], Lauren A, NP   6 months ago Prediabetes   [facility] Jon Billings, NP      Future Appointments            In 3 months Jon Billings, NP [facility], [place]

## 2021-11-03 NOTE — Telephone Encounter (Signed)
Medication Refill - Medication:  carvedilol (COREG) 25 MG tablet   Has the patient contacted their pharmacy? Yes.   Contact PCP  Preferred Pharmacy (with phone number or street name):  [facility] [place] - [address]  [place], [place] [address]  Phone:  [phone]  Fax:  [phone]   Has the patient been seen for an appointment in the last year OR does the patient have an upcoming appointment? Yes.    Agent: Please be advised that RX refills may take up to 3 business days. We ask that you follow-up with your pharmacy.

## 2021-11-24 ENCOUNTER — Ambulatory Visit
Admission: RE | Admit: 2021-11-24 | Discharge: 2021-11-24 | Disposition: A | Discharge status: Home / Self Care | Payer: 59 | Source: Ambulatory Visit | Attending: Nurse Practitioner | Admitting: Nurse Practitioner

## 2021-11-24 ENCOUNTER — Other Ambulatory Visit: Payer: Self-pay

## 2021-11-24 DIAGNOSIS — Z1231 Encounter for screening mammogram for malignant neoplasm of breast: Secondary | ICD-10-CM | POA: Insufficient documentation

## 2021-11-24 IMAGING — MG MM DIGITAL SCREENING BILAT W/ TOMO AND CAD
6 of 10 series · 6 of 30 positions shown · non-contrast
Comparison: Previous exam(s).

CLINICAL DATA: Screening.

EXAM:
DIGITAL SCREENING BILATERAL MAMMOGRAM WITH TOMOSYNTHESIS AND CAD
TECHNIQUE: Bilateral screening digital craniocaudal and mediolateral oblique
mammograms were obtained. Bilateral screening digital breast
tomosynthesis was performed. The images were evaluated with
computer-aided detection.

[L MLO synth-2D (1 of 2)]
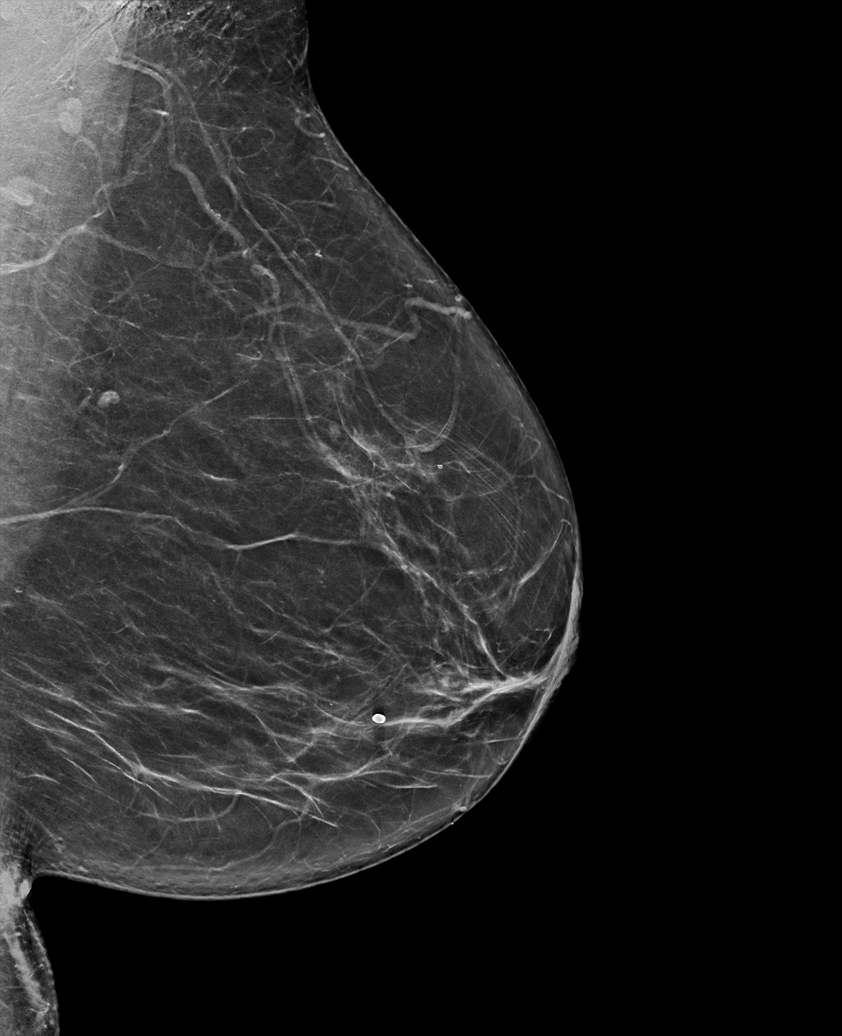

[R MLO synth-2D]
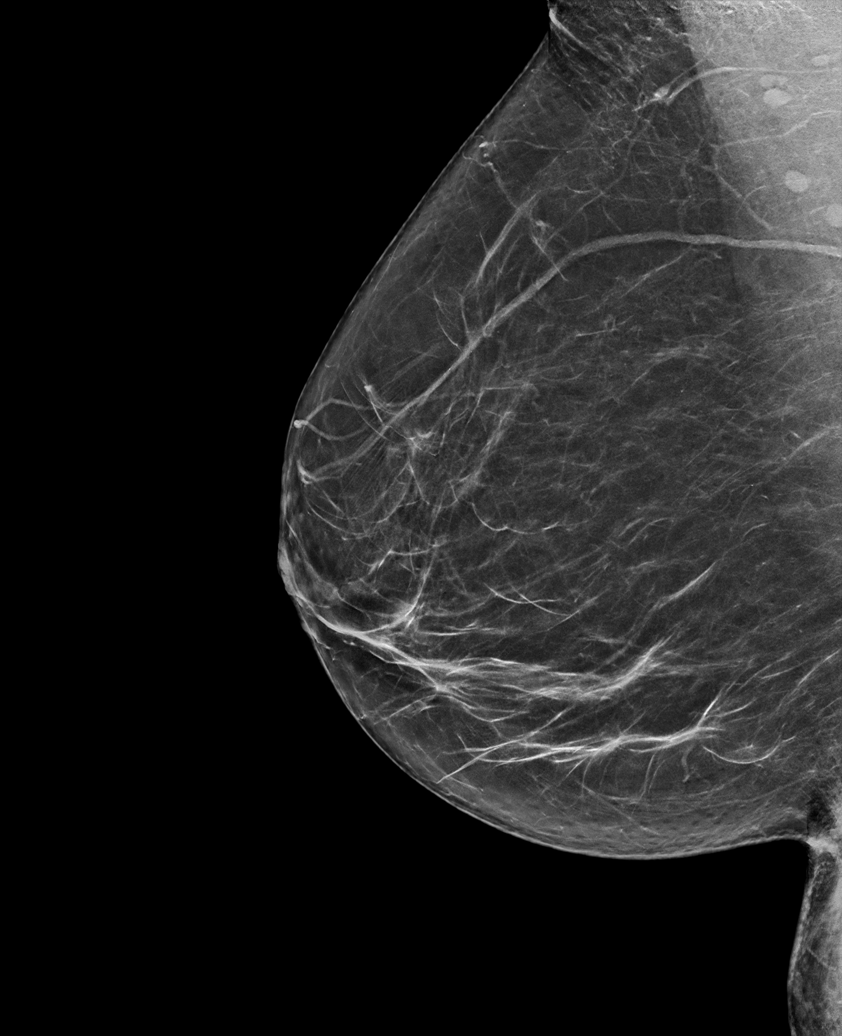

[L MLO synth-2D (2 of 2)]
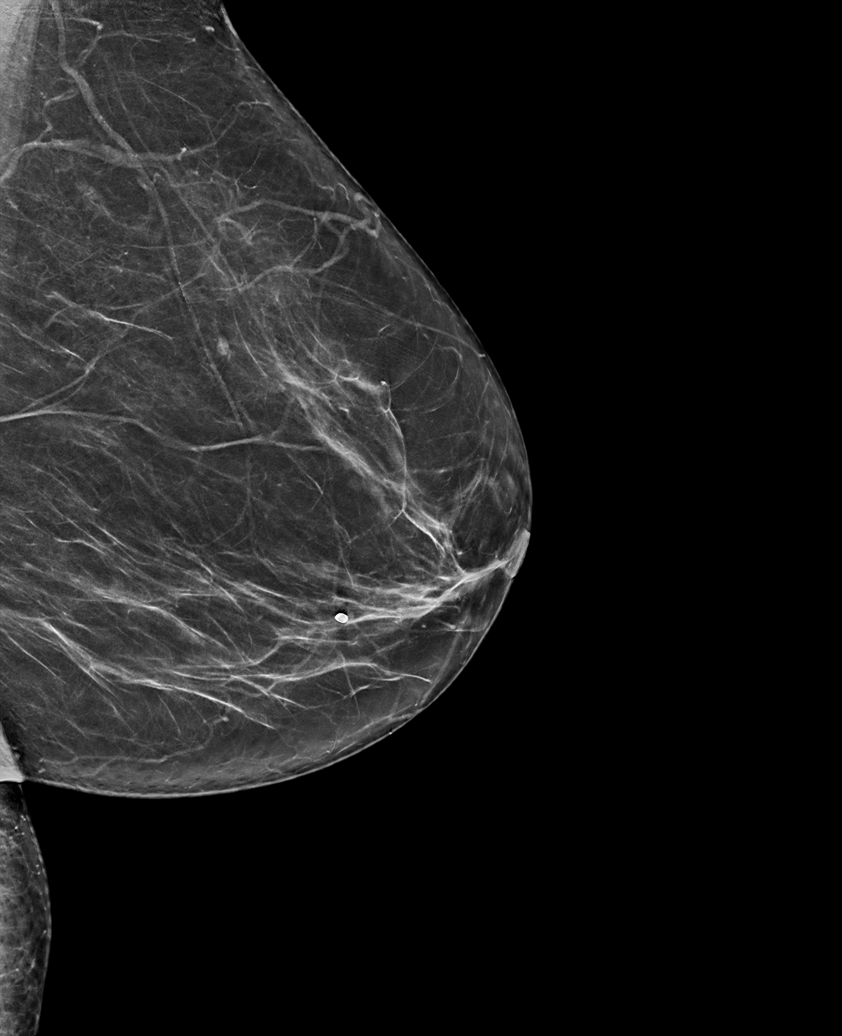

[R CC synth-2D]
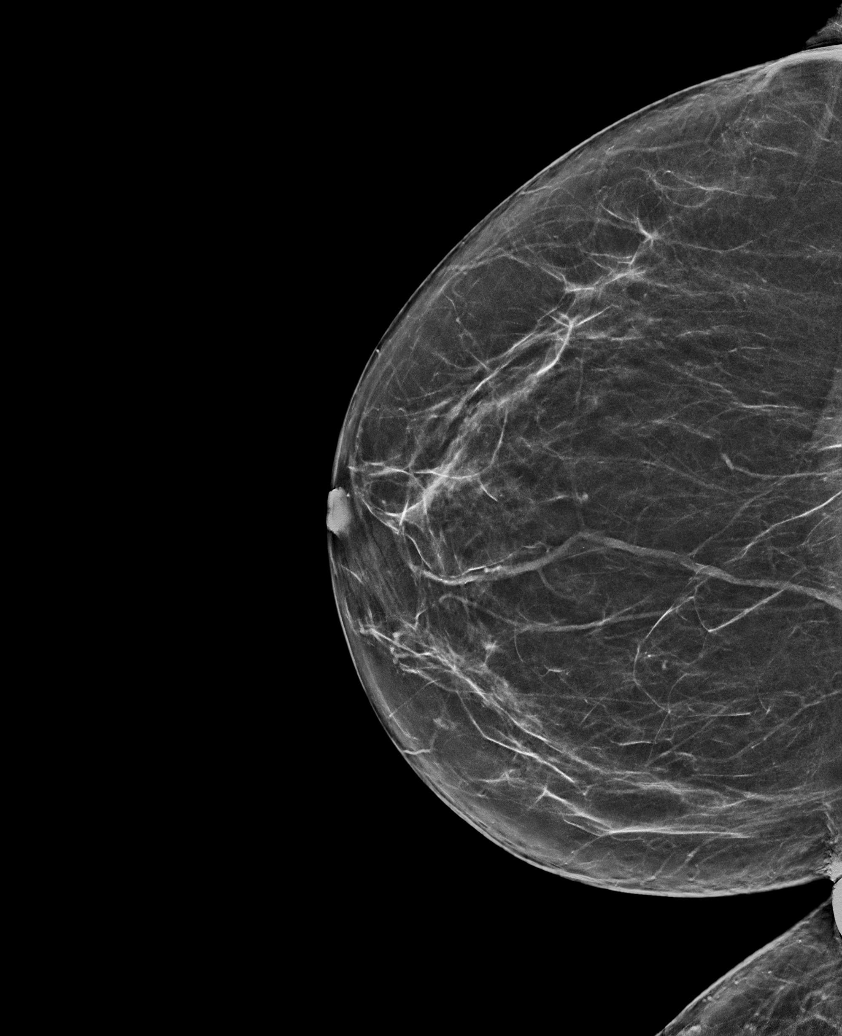

[L CC synth-2D]
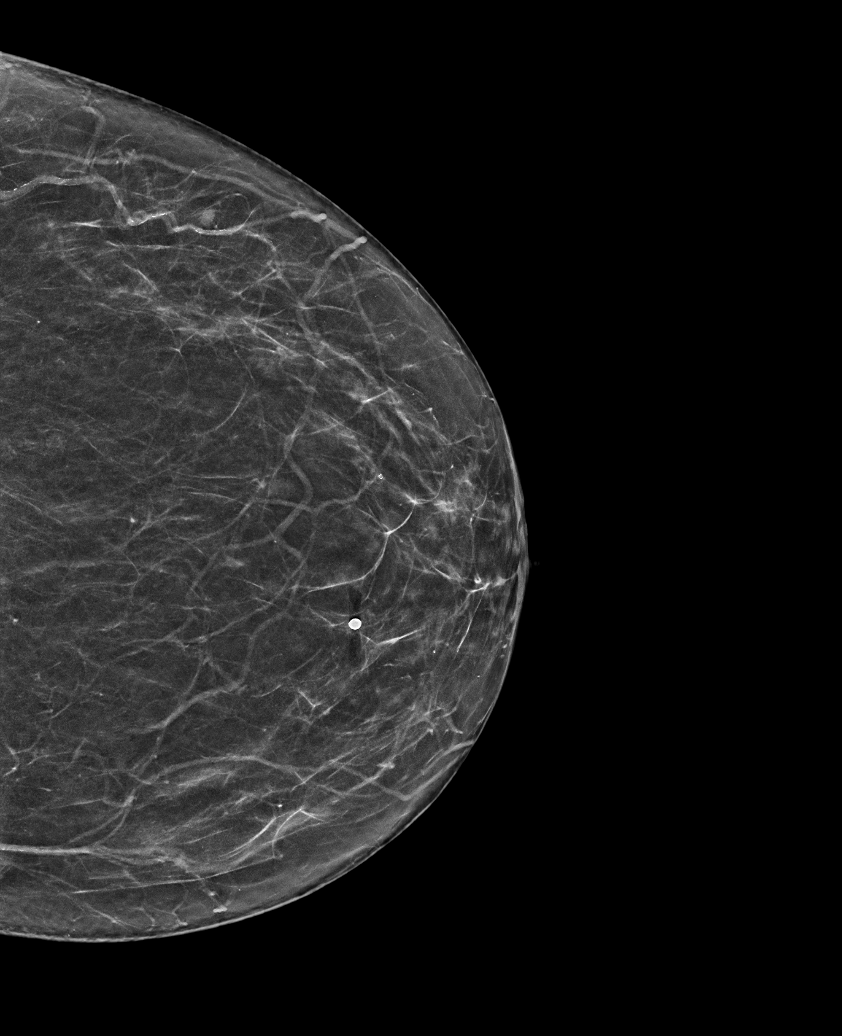

[L CC tomo · tomo slice 32/63.0]
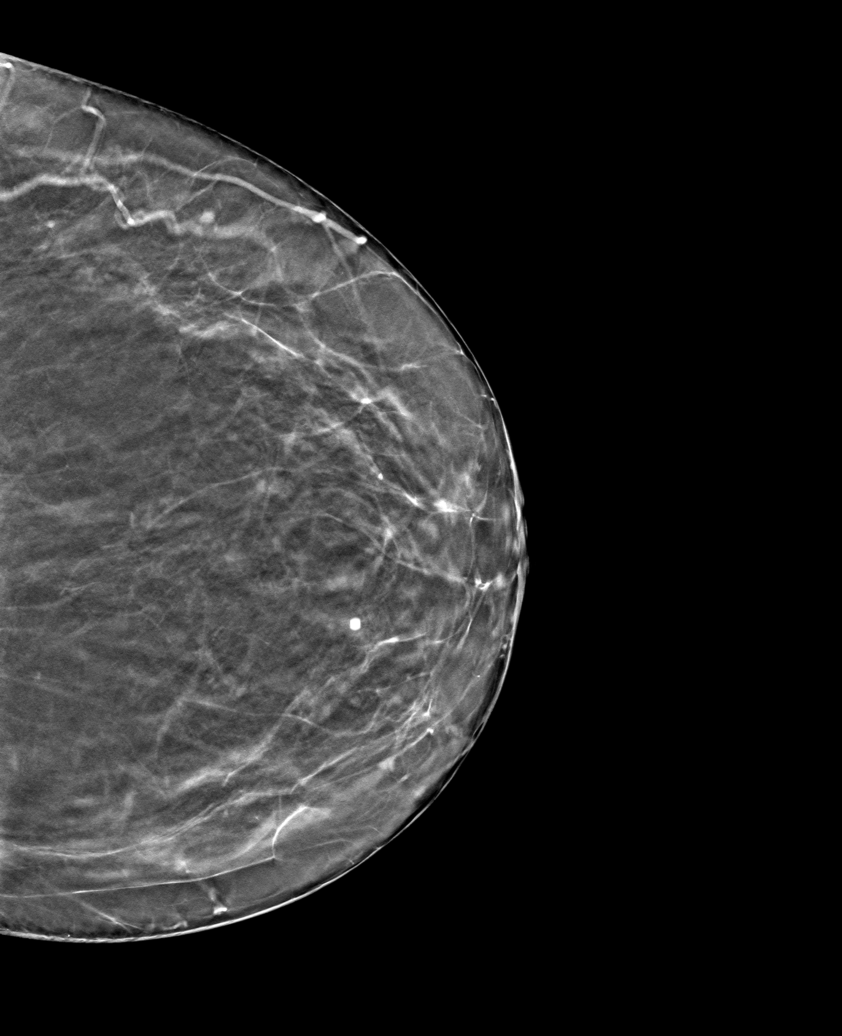

[6 of 30 positions shown; findings below may reference images not displayed]

ACR Breast Density Category b: There are scattered areas of
fibroglandular density.
FINDINGS: There are no findings suspicious for malignancy.
IMPRESSION: No mammographic evidence of malignancy. A result letter of this
screening mammogram will be mailed directly to the patient.

RECOMMENDATION:
Screening mammogram in one year. (Code:[BY])

BI-RADS CATEGORY  1: Negative.

## 2021-12-09 ENCOUNTER — Inpatient Hospital Stay
Admission: RE | Admit: 2021-12-09 | Discharge: 2021-12-09 | Disposition: A | Discharge status: Home / Self Care | Payer: Self-pay | Source: Ambulatory Visit | Attending: *Deleted | Admitting: *Deleted

## 2021-12-09 ENCOUNTER — Other Ambulatory Visit: Payer: Self-pay | Admitting: *Deleted

## 2021-12-09 DIAGNOSIS — Z1231 Encounter for screening mammogram for malignant neoplasm of breast: Secondary | ICD-10-CM

## 2021-12-09 NOTE — Progress Notes (Signed)
Please let patient know her Mammogram did not show any evidence of a malignancy.  The recommendation is to repeat the Mammogram in 1 year.  

## 2021-12-10 ENCOUNTER — Other Ambulatory Visit: Payer: Self-pay | Admitting: Nurse Practitioner

## 2021-12-10 NOTE — Telephone Encounter (Signed)
Requested Prescriptions  ?Pending Prescriptions Disp Refills  ?? amLODipine (NORVASC) 10 MG tablet [Pharmacy Med Name: amLODIPine Besylate 10 MG Oral Tablet] 90 tablet 0  ?  Sig: Take 1 tablet by mouth once daily  ?  ? Cardiovascular: Calcium Channel Blockers 2 Failed - 12/10/2021  2:25 AM  ?  ?  Failed - Last Heart Rate in normal range  ?  Pulse Readings from Last 1 Encounters:  ?10/19/21 (!) 44  ?   ?  ?  Passed - Last BP in normal range  ?  BP Readings from Last 1 Encounters:  ?10/19/21 (!) 109/59  ?   ?  ?  Passed - Valid encounter within last 6 months  ?  Recent Outpatient Visits   ?      ? 4 months ago Primary hypertension  ? [place], NP  ? 5 months ago Urinary frequency  ? [place], Lauren A, NP  ? 7 months ago Prediabetes  ? [facility] Jon Billings, NP  ?  ?  ?Future Appointments   ?        ? In 1 month Jon Billings, NP [facility]  ?  ? ?  ?  ?  ? ? ?

## 2022-01-05 ENCOUNTER — Other Ambulatory Visit: Payer: Self-pay | Admitting: Nurse Practitioner

## 2022-01-06 NOTE — Telephone Encounter (Addendum)
Clopidogrel last RF 06/10/21 #90 1 RF ? Needs blood work. ? ?Requested Prescriptions  ?Pending Prescriptions Disp Refills  ? losartan (COZAAR) 50 MG tablet [Pharmacy Med Name: Losartan Potassium 50 MG Oral Tablet] 90 tablet 0  ?  Sig: Take 1 tablet by mouth once daily  ?  ? Cardiovascular:  Angiotensin Receptor Blockers Passed - 01/05/2022  9:57 AM  ?  ?  Passed - Cr in normal range and within 180 days  ?  Creatinine, Ser  ?Date Value Ref Range Status  ?08/06/2021 0.90 0.57 - 1.00 mg/dL Final  ?   ?  ?  Passed - K in normal range and within 180 days  ?  Potassium  ?Date Value Ref Range Status  ?08/06/2021 4.3 3.5 - 5.2 mmol/L Final  ?   ?  ?  Passed - Patient is not pregnant  ?  ?  Passed - Last BP in normal range  ?  BP Readings from Last 1 Encounters:  ?10/19/21 (!) 109/59  ?   ?  ?  Passed - Valid encounter within last 6 months  ?  Recent Outpatient Visits   ? ?      ? 5 months ago Primary hypertension  ? [place], NP  ? 6 months ago Urinary frequency  ? [facility], Lauren A, NP  ? 8 months ago Prediabetes  ? [facility] Jon Billings, NP  ? ?  ?  ?Future Appointments   ? ?        ? In 4 weeks Jon Billings, NP [facility]  ? ?  ? ?  ?  ?  ? amLODipine (NORVASC) 10 MG tablet [Pharmacy Med Name: amLODIPine Besylate 10 MG Oral Tablet] 90 tablet 0  ?  Sig: Take 1 tablet by mouth once daily  ?  ? Cardiovascular: Calcium Channel Blockers 2 Failed - 01/05/2022  9:57 AM  ?  ?  Failed - Last Heart Rate in normal range  ?  Pulse Readings from Last 1 Encounters:  ?10/19/21 (!) 44  ?   ?  ?  Passed - Last BP in normal range  ?  BP Readings from Last 1 Encounters:  ?10/19/21 (!) 109/59  ?   ?  ?  Passed - Valid encounter within last 6 months  ?  Recent Outpatient Visits   ? ?      ? 5 months ago Primary hypertension  ? [place], NP  ? 6 months ago Urinary frequency  ? [facility],  Lauren A, NP  ? 8 months ago Prediabetes  ? [facility] Jon Billings, NP  ? ?  ?  ?Future Appointments   ? ?        ? In 4 weeks Jon Billings, NP [facility], [facility]  ? ?  ? ?  ?  ?  ?Refused Prescriptions Disp Refills  ? clopidogrel (PLAVIX) 75 MG tablet [Pharmacy Med Name: Clopidogrel Bisulfate 75 MG Oral Tablet] 90 tablet   ?  Sig: Take 1 tablet by mouth once daily  ?  ? Hematology: Antiplatelets - clopidogrel Failed - 01/05/2022  9:57 AM  ?  ?  Failed - HCT in normal range and within 180 days  ?  No results found for: HCT, HCTKUC, SRHCT   ?  ?  Failed - HGB in normal range and within 180 days  ?  No results found for:  HGB, HGBKUC, HGBPOCKUC, [place], [place], HGBPLASMA   ?  ?  Failed - PLT in normal range and within 180 days  ?  No results found for: PLT, PLTCOUNTKUC, LABPLAT, POCPLA   ?  ?  Passed - Cr in normal range and within 360 days  ?  Creatinine, Ser  ?Date Value Ref Range Status  ?08/06/2021 0.90 0.57 - 1.00 mg/dL Final  ?   ?  ?  Passed - Valid encounter within last 6 months  ?  Recent Outpatient Visits   ? ?      ? 5 months ago Primary hypertension  ? [place], NP  ? 6 months ago Urinary frequency  ? [facility], Lauren A, NP  ? 8 months ago Prediabetes  ? [facility] Jon Billings, NP  ? ?  ?  ?Future Appointments   ? ?        ? In 4 weeks Jon Billings, NP [facility], [facility]  ? ?  ? ?  ?  ?  ? ? ?

## 2022-01-12 ENCOUNTER — Ambulatory Visit: Payer: Self-pay

## 2022-01-12 ENCOUNTER — Encounter: Payer: Self-pay | Admitting: Nurse Practitioner

## 2022-01-12 ENCOUNTER — Ambulatory Visit (INDEPENDENT_AMBULATORY_CARE_PROVIDER_SITE_OTHER): Payer: 59 | Admitting: Nurse Practitioner

## 2022-01-12 VITALS — BP 102/66 | HR 53 | Temp 97.5°F | Ht 64.0 in | Wt 203.0 lb

## 2022-01-12 DIAGNOSIS — M5442 Lumbago with sciatica, left side: Secondary | ICD-10-CM

## 2022-01-12 DIAGNOSIS — M25521 Pain in right elbow: Secondary | ICD-10-CM | POA: Diagnosis not present

## 2022-01-12 DIAGNOSIS — M545 Low back pain, unspecified: Secondary | ICD-10-CM | POA: Insufficient documentation

## 2022-01-12 DIAGNOSIS — G8929 Other chronic pain: Secondary | ICD-10-CM

## 2022-01-12 MED ORDER — PREDNISONE 10 MG PO TABS: ORAL_TABLET | ORAL | 0 refills | Status: DC

## 2022-01-12 NOTE — Progress Notes (Signed)
? ?BP 102/66   Pulse (!) 53   Temp (!) 97.5 ?F (36.4 ?C) (Oral)   Ht '5\' 4"'$  (1.626 m)   Wt 203 lb (92.1 kg)   SpO2 99%   BMI 34.84 kg/m?   ? ?Subjective:  ? ? Patient ID: Amanda Ochoa, female    DOB: 12-12-1950, 71 y.o.   MRN: [id number] ? ?HPI: ?Amanda Ochoa is a 71 y.o. female ? ?Chief Complaint  ?Patient presents with  ? Arm Pain  ?  Patient is here for possible Spider Bite. Patient states she noticed the spot Sunday night. Patient states she woke up and her right forearm area was sore, stiff and swollen. Patient states she then got up and ran her a bath and there was no spider in the tub, but when she let the water out she noticed a dead spider in the tub. Patient states she has tried alcohol to help the swelling go down. Patient states her arm was sore Monday and then Tuesday it was extremely painful.   ? Osteoarthritis  ?  Patient states she was diagnosed with Osteoarthritis back in 2015 and she thinks that it may be bothering her again as she is having issues with her left leg on Sunday night as well. Patient states she also has issues with her sciatica nerve in her left side.   ? ?ARM PAIN (RIGHT) ?Thinks she may have been bit by a spider on Monday morning, was swollen under her right arm and it was stiff.  Ran her tub bath and noted a spider in tub after her bath, which had not been there prior to getting in bath.  Using warm soaks, alcohol, and Penetrix.  Does feel pain is improving today.   ?Duration: days ?Location: upper arm ?Mechanism of injury: trauma ?Onset: gradual ?Severity: 5/10  ?Quality:  dull, aching, and throbbing ?Frequency: constant ?Radiation: no ?Aggravating factors: bending and movement  ?Alleviating factors: warm soaks, alcohol, and Penetrix  ?Status: stable ?Treatments attempted: as above  ?Relief with NSAIDs?:  No NSAIDs Taken ?Swelling: yes -- improved ?Redness: no  ?Warmth: no ?Trauma: no ?Chest pain: no  ?Shortness of breath: no  ?Fever: no ?Decreased sensation:  no ?Paresthesias: no ?Weakness: no  ? ?BACK PAIN ?Has OA underlying per her report, is having issues with her left leg and left sciatic --- she reports this is not as bad as it was, using warm soaks, alcohol, and Penetrix.  This started on Monday.  Has osteoporosis and is taking Fosamax.  Does feel pain is improving today. ?Duration: days ?Mechanism of injury: unknown ?Location: Left ?Onset: sudden ?Severity: 5/10 ?Quality: dull, aching, and throbbing ?Frequency: intermittent ?Radiation: none ?Aggravating factors: lifting, movement, and bending ?Alleviating factors: alcohol, warm soaks, and Penetrix ?Status: stable ?Treatments attempted: as above  ?Relief with NSAIDs?: No NSAIDs Taken ?Nighttime pain:  no ?Paresthesias / decreased sensation:  no ?Bowel / bladder incontinence:  no ?Fevers:  no ?Dysuria / urinary frequency:  no  ? ?Relevant past medical, surgical, family and social history reviewed and updated as indicated. Interim medical history since our last visit reviewed. ?Allergies and medications reviewed and updated. ? ?Review of Systems  ?Constitutional:  Negative for activity change, appetite change, diaphoresis, fatigue and fever.  ?Respiratory:  Negative for cough, chest tightness and shortness of breath.   ?Cardiovascular:  Negative for chest pain, palpitations and leg swelling.  ?Gastrointestinal: Negative.   ?Musculoskeletal:  Positive for arthralgias.  ?Neurological: Negative.   ?Psychiatric/Behavioral: Negative.    ? ?  Per HPI unless specifically indicated above ? ?   ?Objective:  ?  ?BP 102/66   Pulse (!) 53   Temp (!) 97.5 ?F (36.4 ?C) (Oral)   Ht '5\' 4"'$  (1.626 m)   Wt 203 lb (92.1 kg)   SpO2 99%   BMI 34.84 kg/m?   ?Wt Readings from Last 3 Encounters:  ?01/12/22 203 lb (92.1 kg)  ?10/19/21 198 lb 6.6 oz (90 kg)  ?08/06/21 198 lb 3.2 oz (89.9 kg)  ?  ?Physical Exam ?Vitals and nursing note reviewed.  ?Constitutional:   ?   General: She is awake. She is not in acute distress. ?   Appearance: She  is well-developed and well-groomed. She is obese. She is not ill-appearing or toxic-appearing.  ?HENT:  ?   Head: Normocephalic.  ?   Right Ear: Hearing normal.  ?   Left Ear: Hearing normal.  ?Eyes:  ?   General: Lids are normal.     ?   Right eye: No discharge.     ?   Left eye: No discharge.  ?   Conjunctiva/sclera: Conjunctivae normal.  ?   Pupils: Pupils are equal, round, and reactive to light.  ?Neck:  ?   Thyroid: No thyromegaly.  ?   Vascular: No carotid bruit.  ?Cardiovascular:  ?   Rate and Rhythm: Normal rate and regular rhythm.  ?   Heart sounds: Normal heart sounds. No murmur heard. ?  No gallop.  ?Pulmonary:  ?   Effort: Pulmonary effort is normal. No accessory muscle usage or respiratory distress.  ?   Breath sounds: Normal breath sounds.  ?Abdominal:  ?   General: Bowel sounds are normal.  ?   Palpations: Abdomen is soft. There is no hepatomegaly or splenomegaly.  ?Musculoskeletal:  ?   Right upper arm: No swelling, edema, lacerations, tenderness or bony tenderness.  ?   Left upper arm: Normal.  ?   Cervical back: Normal range of motion and neck supple.  ?   Lumbar back: Tenderness (mid lower back) present. No swelling, edema, spasms or bony tenderness. Normal range of motion. Negative right straight leg raise test and negative left straight leg raise test.  ?   Right lower leg: No edema.  ?   Left lower leg: No edema.  ?   Comments: Full ROM without pain to right upper arm.  No spider bites noted, no erythema, edema, or warmth.  No rashes noted.  ?Lymphadenopathy:  ?   Cervical: No cervical adenopathy.  ?Skin: ?   General: Skin is warm and dry.  ?   Findings: No signs of injury or rash.  ?Neurological:  ?   Mental Status: She is alert and oriented to person, place, and time.  ?Psychiatric:     ?   Attention and Perception: Attention normal.     ?   Mood and Affect: Mood normal.     ?   Speech: Speech normal.     ?   Behavior: Behavior normal. Behavior is cooperative.     ?   Thought Content: Thought  content normal.  ? ? ?Results for orders placed or performed during the hospital encounter of 10/19/21  ?Surgical pathology  ?Result Value Ref Range  ? SURGICAL PATHOLOGY    ?  SURGICAL PATHOLOGY ?CASE: [id number] ?PATIENT: Daren Defreitas ?Surgical Pathology Report ? ? ? ? ?Specimen Submitted: ?A. Colon polyp, ascending; cold snare ?B. Colon polyp, descending; cold snare ? ?Clinical History: Colon cancer screening  Z12.11.  Colon polyps ? ? ? ? ? ?DIAGNOSIS: ?A. COLON POLYP, ASCENDING; COLD SNARE: ?- TUBULAR ADENOMA. ?- NEGATIVE FOR HIGH-GRADE DYSPLASIA AND MALIGNANCY. ? ?B.  COLON POLYP, DESCENDING; COLD SNARE: ?- TUBULAR ADENOMA. ?- NEGATIVE FOR HIGH-GRADE DYSPLASIA AND MALIGNANCY. ? ?GROSS DESCRIPTION: ?A. Labeled: Cold snare ascending colon polyp ?Received: Formalin ?Collection time: 9:34 AM on 10/19/2021 ?Placed into formalin time: 9:34 AM on 10/19/2021 ?Tissue fragment(s): Multiple ?Size: Aggregate, 2.3 x 0.4 x 0.1 cm ?Description: Tan soft tissue fragments ?Entirely submitted in 1 cassette. ? ?B. Labeled: Cold snare descending colon polyp ?Received: Formalin ?Collection time: 9:41 AM on 10/19/2021 ?Placed into formalin time: 9:41 AM on 10/19/2021 ?Tissue  fragment(s): 1 ?Size: 0.1 x 0.1 x 0.1 cm ?Description: Tan soft tissue fragment ?Entirely submitted in 1 cassette. ? ?CM 10/19/2021 ? ?Final Diagnosis performed by Quay Burow, MD.   Electronically signed ?10/20/2021 8:59:12AM ?The electronic signature indicates that the named Attending Pathologist ?has evaluated the specimen ?Technical component performed at [facility], [address], [place], ?[place] [address] Lab: [phone] Dir: Rush Farmer, MD, MMM ? Professional component performed at [facility], [facility], [place], [place], [place] [address] Lab: [phone] ?Dir: Kathi Simpers, MD ?  ? ?   ?Assessment & Plan:  ? ?Problem List Items Addressed This Visit   ? ?  ? Other  ? Lower back pain  ?  Chronic, with acute flare.  No red  flags.   At this time will send in Prednisone taper for pain and recommend continue use of self treatment methods at home. Add on alternating heat and ice.  For worsening return to office. ?  ?  ? Relevant Medications  ? pre

## 2022-01-12 NOTE — Assessment & Plan Note (Addendum)
Chronic, with acute flare.  No red flags.   At this time will send in Prednisone taper for pain and recommend continue use of self treatment methods at home. Add on alternating heat and ice.  For worsening return to office. ?

## 2022-01-12 NOTE — Patient Instructions (Signed)
Spider Bite ?Spider bites are not common. Most spider bites do not cause serious problems. There are only a few types of spider bites that can cause serious health problems. ?What are the causes? ?A spider bite is often caused by a person accidentally making contact with a spider in a way that traps the spider against the skin. ?What increases the risk? ?You are more likely to be bitten by a spider if: ?You live in an area where spiders live, and you disturb their habitat. ?You work outdoors, such as a Engineer, production. ?You do certain outdoor activities, such as playing in leaves or hiking. ?What are the signs or symptoms? ?Some spider bites may cause symptoms within 1 hour after the bite. For other spider bites, it may take 1-2 days for symptoms to appear. Symptoms include: ?A raised area that is red. ?Redness and swelling around the area of the bite. ?Pain in the area of the bite. ?A few types of spiders, such as the black widow or the brown recluse, can inject poison (venom) into a bite wound. This causes more serious symptoms. Symptoms of these bites vary and may include: ?Muscle cramps. ?Feeling like you may vomit (nauseous). ?Vomiting. ?Pain in your belly (abdomen). ?A fever. ?A skin sore (lesion) that spreads. This can break into an open wound (skin ulcer). ?Feeling light-headed or dizzy. ?How is this treated? ?Many spider bites do not need treatment. If needed, treatment may include: ?Icing and keeping the bite area raised (elevated). ?Taking or applying over-the-counter or prescription medicines to help with symptoms such as pain and itching. ?Having a tetanus shot. ?Taking antibiotic medicine. ?Follow these instructions at home: ?Medicines ?Take or apply over-the-counter and prescription medicines only as told by your doctor. ?If you were prescribed an antibiotic medicine, take or apply it as told by your doctor. Do not stop using it even if you start to feel better. ?Managing pain and swelling ? ?If  told, put ice on the bite area. To do this: ?Put ice in a plastic bag. ?Place a towel between your skin and the bag. ?Leave the ice on for 20 minutes, 2-3 times a day. ?Take off the ice if your skin turns bright red. This is very important. If you cannot feel pain, heat, or cold, you have a greater risk of damage to the area. ?Raise the bite area above the level of your heart while you are sitting or lying down. ?General instructions ? ?Do not scratch the bite area. ?Keep the bite area clean and dry. Wash the bite area with soap and water each day as told by your doctor. ?Keep all follow-up visits. ?Contact a doctor if: ?Your bite does not get better after 3 days. ?Your bite turns black or purple. ?Near the bite, you have more: ?Redness. ?Swelling. ?Pain. ?Get help right away if: ?You get shortness of breath or chest pain. ?You have fluid, blood, or pus coming from the bite area. ?You have painful muscle cramps or sudden muscle tightening (spasms). ?You have belly pain. ?You feel like you may vomit or you vomit. ?You feel more tired or sleepy than normal. ?These symptoms may be an emergency. Get help right away. Call your local emergency services (911 in the [place] ?Do not wait to see if the symptoms will go away. ?Do not drive yourself to the hospital. ?Summary ?Spider bites are not common. Most spider bites do not cause serious health problems. ?Take or apply all medicines only as told by your doctor. ?  Keep the bite area clean and dry. Wash the bite area with soap and water each day as told by your doctor. ?Contact a doctor if you have more redness, swelling, or pain near the bite. ?Get help right away if you get shortness of breath or chest pain. ?This information is not intended to replace advice given to you by your health care provider. Make sure you discuss any questions you have with your health care provider. ?Document Revised: 07/08/2020 Document Reviewed: 07/08/2020 ?Elsevier Patient Education ? [place]. ? ?

## 2022-01-12 NOTE — Telephone Encounter (Signed)
? ? ?  Chief Complaint: Possible spider bite to right upper arm ?Symptoms: Saw spider in her bath tub after bathing ?Frequency: 2 days ago ?Pertinent Negatives: Patient denies fever ?Disposition: '[]'$ ED /'[]'$ Urgent Care (no appt availability in office) / '[]'$ Appointment(In office/virtual)/ '[]'$  [facility]/ '[]'$ Home Care/ '[]'$ Refused Recommended Disposition /'[]'$ [facility]/ '[]'$  Follow-up with PCP ?Additional Notes: Warm transfer to [facility] in the practice for appointment.  ?Answer Assessment - Initial Assessment Questions ?1. TYPE of SPIDER: "What type of spider was it?"  (e.g., name, unknown, or brief description) ?    Unknown, Grey-black in color ?2. LOCATION: "Where is the bite located?"  ?    Right arm, above elbow ?3. PAIN: "Is there any pain?" If Yes, ask: "How bad is it?"  (Scale 1-10; or mild, moderate, severe) ?    Moderate ?4. SWELLING: "How big is the swelling?" (Inches, cm or compare to coins)  ?    Yes ?5. ONSET: "When did the bite occur?" (Minutes or hours ago)  ?    2 days ago ?6. TETANUS: "When was the last tetanus booster?"  ?    Unsure ?7. OTHER SYMPTOMS: "Do you have any other symptoms?"  (e.g., muscle cramps, abdominal pain, change in urine color) ?    No ? ?Protocols used: Spider Bite - [place]-A-AH ? ?

## 2022-01-12 NOTE — Assessment & Plan Note (Signed)
Acute, improving with self treatment at home.  ?spider bite, no bite seen.  At this time recommend she continues self treatment at home.  Will supply Prednisone taper which may offer benefit to both back and arm pain.  Return to office for worsening or ongoing. ?

## 2022-02-02 NOTE — Progress Notes (Signed)
? ?BP 113/71   Pulse 98   Temp 98.4 ?F (36.9 ?C) (Oral)   Wt 206 lb 3.2 oz (93.5 kg)   SpO2 99%   BMI 35.39 kg/m?   ? ?Subjective:  ? ? Patient ID: Amanda Ochoa, female    DOB: [birth year], 71 y.o.   MRN: [id number] ? ?HPI: ?Amanda Ochoa is a 71 y.o. female ? ?Chief Complaint  ?Patient presents with  ? Hypertension  ? Hyperlipidemia  ? Diabetes  ?  Pt concerned for bladder infection   ? ? ?HYPERTENSION/HYPERLIPIDEMIA ?Hypertension status: controlled  ?Satisfied with current treatment? yes ?Duration of hypertension: years ?BP monitoring frequency:  daily ?BP range: 118/77s ?BP medication side effects:  no ?Medication compliance: excellent compliance ?Previous BP meds:amlodipine, carvedilol, and losartan (cozaar) ?Duration of hyperlipidemia: years ?Cholesterol medication side effects: no ?Cholesterol supplements: none ?Past cholesterol medications: none ?Medication compliance: excellent compliance ?Aspirin: no ?Recent stressors: no ?Recurrent headaches: no ?Visual changes: no ?Palpitations: no ?Dyspnea: no ?Chest pain: no ?Lower extremity edema: no ?Dizzy/lightheaded: no ? ?DIABETES ?Hypoglycemic episodes:no ?Polydipsia/polyuria: no ?Visual disturbance: no ?Chest pain: no ?Paresthesias: no ?Glucose Monitoring: no ? Accucheck frequency: Not Checking ? Fasting glucose: ? Post prandial: ? Evening: ? Before meals: ?Taking Insulin?: no ? Long acting insulin: ? Short acting insulin: ?Blood Pressure Monitoring: daily ?Retinal Examination: Up to Date ?Foot Exam: Up to Date ?Diabetic Education: Not Completed ?Pneumovax: Up to Date ?Influenza: Up to Date ?Aspirin: no ? ?URINARY SYMPTOMS ?Dysuria: yes ?Urinary frequency: yes ?Urgency: yes ?Small volume voids: no ?Symptom severity: no ?Urinary incontinence: no ?Foul odor: no ?Hematuria: no ?Abdominal pain: no ?Back pain: yes ?Suprapubic pain/pressure: no ?Flank pain: no ?Fever:  no ?Vomiting: no ?Relief with cranberry juice: no ?Relief with pyridium: no ?Status:  better/worse/stable ?Previous urinary tract infection: no ?Recurrent urinary tract infection: no ?History of sexually transmitted disease: no ?Treatments attempted: increasing fluids ? ?Patient states she made an appointment and saw another provider in the office.  She completed the prednisone prescription given to her on April 12.  She says the pain has improved.  But the swelling is still there.  States it has been so bad so she called the [facility] and she has an appointment with them tomorrow.  ? ?Patient states she has a polyp.  She is unclear on where it is at but states she can feel it.  It is not painful. She is just concerned that she can feel it. ? ?Relevant past medical, surgical, family and social history reviewed and updated as indicated. Interim medical history since our last visit reviewed. ?Allergies and medications reviewed and updated. ? ?Review of Systems  ?Constitutional:  Negative for fever.  ?Eyes:  Negative for visual disturbance.  ?Respiratory:  Negative for cough, chest tightness and shortness of breath.   ?Cardiovascular:  Negative for chest pain, palpitations and leg swelling.  ?Gastrointestinal:  Negative for abdominal pain and vomiting.  ?Genitourinary:  Positive for dysuria, frequency and urgency. Negative for decreased urine volume, flank pain and hematuria.  ?Musculoskeletal:  Negative for back pain.  ?     Left knee pain  ?Neurological:  Negative for dizziness and headaches.  ? ?Per HPI unless specifically indicated above ? ?   ?Objective:  ?  ?BP 113/71   Pulse 98   Temp 98.4 ?F (36.9 ?C) (Oral)   Wt 206 lb 3.2 oz (93.5 kg)   SpO2 99%   BMI 35.39 kg/m?   ?Wt Readings from Last 3 Encounters:  ?02/03/22 206 lb  3.2 oz (93.5 kg)  ?01/12/22 203 lb (92.1 kg)  ?10/19/21 198 lb 6.6 oz (90 kg)  ?  ?Physical Exam ?Vitals and nursing note reviewed.  ?Constitutional:   ?   General: She is not in acute distress. ?   Appearance: Normal appearance. She is normal weight. She is not  ill-appearing, toxic-appearing or diaphoretic.  ?HENT:  ?   Head: Normocephalic.  ?   Right Ear: External ear normal.  ?   Left Ear: External ear normal.  ?   Nose: Nose normal.  ?   Mouth/Throat:  ?   Mouth: Mucous membranes are moist.  ?   Pharynx: Oropharynx is clear.  ?Eyes:  ?   General:     ?   Right eye: No discharge.     ?   Left eye: No discharge.  ?   Extraocular Movements: Extraocular movements intact.  ?   Conjunctiva/sclera: Conjunctivae normal.  ?   Pupils: Pupils are equal, round, and reactive to light.  ?Cardiovascular:  ?   Rate and Rhythm: Normal rate and regular rhythm.  ?   Heart sounds: No murmur heard. ?Pulmonary:  ?   Effort: Pulmonary effort is normal. No respiratory distress.  ?   Breath sounds: Normal breath sounds. No wheezing or rales.  ?Abdominal:  ?   General: Abdomen is flat. Bowel sounds are normal. There is no distension.  ?   Palpations: Abdomen is soft.  ?   Tenderness: There is no abdominal tenderness. There is no right CVA tenderness, left CVA tenderness or guarding.  ?Musculoskeletal:  ?   Cervical back: Neck supple. Pain with movement present. Decreased range of motion.  ?   Comments: Wearing knee brace  ?Skin: ?   General: Skin is warm and dry.  ?   Capillary Refill: Capillary refill takes less than 2 seconds.  ?Neurological:  ?   General: No focal deficit present.  ?   Mental Status: She is alert and oriented to person, place, and time. Mental status is at baseline.  ?Psychiatric:     ?   Mood and Affect: Mood normal.     ?   Behavior: Behavior normal.     ?   Thought Content: Thought content normal.     ?   Judgment: Judgment normal.  ? ? ?Results for orders placed or performed during the hospital encounter of 10/19/21  ?Surgical pathology  ?Result Value Ref Range  ? SURGICAL PATHOLOGY    ?  SURGICAL PATHOLOGY ?CASE: [id number] ?PATIENT: Amanda Ochoa ?Surgical Pathology Report ? ? ? ? ?Specimen Submitted: ?A. Colon polyp, ascending; cold snare ?B. Colon polyp,  descending; cold snare ? ?Clinical History: Colon cancer screening Z12.11.  Colon polyps ? ? ? ? ? ?DIAGNOSIS: ?A. COLON POLYP, ASCENDING; COLD SNARE: ?- TUBULAR ADENOMA. ?- NEGATIVE FOR HIGH-GRADE DYSPLASIA AND MALIGNANCY. ? ?B.  COLON POLYP, DESCENDING; COLD SNARE: ?- TUBULAR ADENOMA. ?- NEGATIVE FOR HIGH-GRADE DYSPLASIA AND MALIGNANCY. ? ?GROSS DESCRIPTION: ?A. Labeled: Cold snare ascending colon polyp ?Received: Formalin ?Collection time: 9:34 AM on 10/19/2021 ?Placed into formalin time: 9:34 AM on 10/19/2021 ?Tissue fragment(s): Multiple ?Size: Aggregate, 2.3 x 0.4 x 0.1 cm ?Description: Tan soft tissue fragments ?Entirely submitted in 1 cassette. ? ?B. Labeled: Cold snare descending colon polyp ?Received: Formalin ?Collection time: 9:41 AM on 10/19/2021 ?Placed into formalin time: 9:41 AM on 10/19/2021 ?Tissue  fragment(s): 1 ?Size: 0.1 x 0.1 x 0.1 cm ?Description: Tan soft tissue fragment ?Entirely submitted in 1 cassette. ? ?  CM 10/19/2021 ? ?Final Diagnosis performed by Quay Burow, MD.   Electronically signed ?10/20/2021 8:59:12AM ?The electronic signature indicates that the named Attending Pathologist ?has evaluated the specimen ?Technical component performed at [facility], [address], [place], ?[place] [address] Lab: [phone] Dir: Rush Farmer, MD, MMM ? Professional component performed at [facility], [facility], [place], [place], [place] [address] Lab: [phone] ?Dir: Kathi Simpers, MD ?  ? ?   ?Assessment & Plan:  ? ?Problem List Items Addressed This Visit   ? ?  ? Cardiovascular and Mediastinum  ? Hypertension - Primary  ?  Chronic.  Controlled.  Continue with current medication regimen of Losartan and Carvedilol.  Labs ordered today.  Return to clinic in 6 months for reevaluation.  Call sooner if concerns arise.  ? ? ?  ?  ? Relevant Medications  ? amLODipine (NORVASC) 10 MG tablet  ? carvedilol (COREG) 25 MG tablet  ? losartan (COZAAR) 50 MG tablet  ? Other Relevant  Orders  ? Comp Met (CMET)  ?  ? Other  ? Prediabetes  ?  Labs ordered today. Will make recommendations based on lab results.  ? ?  ?  ? Relevant Orders  ? HgB A1c  ? ?Other Visit Diagnoses   ? ? Elevated LDL cholesterol level      ? Labs or

## 2022-02-03 ENCOUNTER — Ambulatory Visit (INDEPENDENT_AMBULATORY_CARE_PROVIDER_SITE_OTHER): Payer: 59 | Admitting: Nurse Practitioner

## 2022-02-03 ENCOUNTER — Encounter: Payer: Self-pay | Admitting: Nurse Practitioner

## 2022-02-03 VITALS — BP 113/71 | HR 98 | Temp 98.4°F | Wt 206.2 lb

## 2022-02-03 DIAGNOSIS — E7800 Pure hypercholesterolemia, unspecified: Secondary | ICD-10-CM

## 2022-02-03 DIAGNOSIS — I10 Essential (primary) hypertension: Secondary | ICD-10-CM

## 2022-02-03 DIAGNOSIS — N3000 Acute cystitis without hematuria: Secondary | ICD-10-CM

## 2022-02-03 DIAGNOSIS — R7303 Prediabetes: Secondary | ICD-10-CM | POA: Diagnosis not present

## 2022-02-03 DIAGNOSIS — Z01419 Encounter for gynecological examination (general) (routine) without abnormal findings: Secondary | ICD-10-CM

## 2022-02-03 DIAGNOSIS — R300 Dysuria: Secondary | ICD-10-CM | POA: Diagnosis not present

## 2022-02-03 LAB — URINALYSIS, ROUTINE W REFLEX MICROSCOPIC
Bilirubin, UA: NEGATIVE
Glucose, UA: NEGATIVE
Ketones, UA: NEGATIVE
Nitrite, UA: NEGATIVE
Protein,UA: NEGATIVE
RBC, UA: NEGATIVE
Specific Gravity, UA: <1.005 — ABNORMAL LOW (ref 1.005–1.030)
Urobilinogen, Ur: 0.2 mg/dL (ref 0.2–1.0)
pH, UA: 6 (ref 5.0–7.5)

## 2022-02-03 LAB — MICROSCOPIC EXAMINATION
Bacteria, UA: NONE SEEN
RBC, Urine: NONE SEEN /hpf (ref 0–2)

## 2022-02-03 MED ORDER — CARVEDILOL 25 MG PO TABS: 25.0000 mg | ORAL_TABLET | Freq: Two times a day (BID) | ORAL | 1 refills | Status: DC

## 2022-02-03 MED ORDER — LOSARTAN POTASSIUM 50 MG PO TABS: 50.0000 mg | ORAL_TABLET | Freq: Every day | ORAL | 1 refills | Status: DC

## 2022-02-03 MED ORDER — AMLODIPINE BESYLATE 10 MG PO TABS: 10.0000 mg | ORAL_TABLET | Freq: Every day | ORAL | 1 refills | Status: DC

## 2022-02-03 MED ORDER — SULFAMETHOXAZOLE-TRIMETHOPRIM 800-160 MG PO TABS: 1.0000 | ORAL_TABLET | Freq: Two times a day (BID) | ORAL | 0 refills | Status: DC

## 2022-02-03 MED ORDER — CLOPIDOGREL BISULFATE 75 MG PO TABS: 75.0000 mg | ORAL_TABLET | Freq: Every day | ORAL | 1 refills | Status: DC

## 2022-02-03 NOTE — Assessment & Plan Note (Signed)
Labs ordered today.  Will make recommendations based on lab results. ?

## 2022-02-03 NOTE — Assessment & Plan Note (Signed)
Chronic.  Controlled.  Continue with current medication regimen of Losartan and Carvedilol.  Labs ordered today.  Return to clinic in 6 months for reevaluation.  Call sooner if concerns arise.  ? ?

## 2022-02-04 DIAGNOSIS — M1712 Unilateral primary osteoarthritis, left knee: Secondary | ICD-10-CM | POA: Insufficient documentation

## 2022-02-04 DIAGNOSIS — M1711 Unilateral primary osteoarthritis, right knee: Secondary | ICD-10-CM | POA: Insufficient documentation

## 2022-02-04 LAB — COMPREHENSIVE METABOLIC PANEL
ALT: 24 IU/L (ref 0–32)
AST: 22 IU/L (ref 0–40)
Albumin/Globulin Ratio: 1.6 (ref 1.2–2.2)
Albumin: 3.9 g/dL (ref 3.8–4.8)
Alkaline Phosphatase: 78 IU/L (ref 44–121)
BUN/Creatinine Ratio: 21 (ref 12–28)
BUN: 14 mg/dL (ref 8–27)
Bilirubin Total: 0.3 mg/dL (ref 0.0–1.2)
CO2: 25 mmol/L (ref 20–29)
Calcium: 8.9 mg/dL (ref 8.7–10.3)
Chloride: 105 mmol/L (ref 96–106)
Creatinine, Ser: 0.68 mg/dL (ref 0.57–1.00)
Globulin, Total: 2.4 g/dL (ref 1.5–4.5)
Glucose: 77 mg/dL (ref 70–99)
Potassium: 4 mmol/L (ref 3.5–5.2)
Sodium: 142 mmol/L (ref 134–144)
Total Protein: 6.3 g/dL (ref 6.0–8.5)
eGFR: 94 mL/min/{1.73_m2} (ref >59)

## 2022-02-04 LAB — LIPID PANEL
Chol/HDL Ratio: 4.1 ratio (ref 0.0–4.4)
Cholesterol, Total: 206 mg/dL — ABNORMAL HIGH (ref 100–199)
HDL: 50 mg/dL (ref >39)
LDL Chol Calc (NIH): 124 mg/dL — ABNORMAL HIGH (ref 0–99)
Triglycerides: 184 mg/dL — ABNORMAL HIGH (ref 0–149)
VLDL Cholesterol Cal: 32 mg/dL (ref 5–40)

## 2022-02-04 LAB — HEMOGLOBIN A1C
Est. average glucose Bld gHb Est-mCnc: 120 mg/dL
Hgb A1c MFr Bld: 5.8 % — ABNORMAL HIGH (ref 4.8–5.6)

## 2022-02-04 MED ORDER — ROSUVASTATIN CALCIUM 5 MG PO TABS: 5.0000 mg | ORAL_TABLET | Freq: Every day | ORAL | 1 refills | Status: DC

## 2022-02-04 NOTE — Addendum Note (Signed)
Addended by: Jon Billings on: 02/04/2022 11:48 AM ? ? Modules accepted: Orders ? ?

## 2022-02-04 NOTE — Progress Notes (Signed)
Medication sent to the pharmacy.

## 2022-02-04 NOTE — Progress Notes (Signed)
Please let patient know that her cholesterol is elevated.  Her cardiac risk score puts her at high risk of having a stroke or heart attack over the next 10 years.  I recommend that she start a statin called crestor '5mg'$  daily.  The goal will be to increase this to '20mg'$  daily if patient tolerates it well.  If she agrees to the medication I can send it to the pharmacy.   ? ?Her A1c is well controlled at 5.8.  Her other lab work looks good.  No other concerns at this time.  Follow up as discussed.  ? ?The 10-year ASCVD risk score (Arnett DK, et al., 2019) is: 17.4% ?  Values used to calculate the score: ?    Age: 71 years ?    Sex: Female ?    Is Non-Hispanic African American: Yes ?    Diabetic: No ?    Tobacco smoker: Yes ?    Systolic Blood Pressure: 528 mmHg ?    Is BP treated: Yes ?    HDL Cholesterol: 50 mg/dL ?    Total Cholesterol: 206 mg/dL ?

## 2022-02-06 LAB — URINE CULTURE

## 2022-02-07 NOTE — Progress Notes (Signed)
Hi [name].  Your urine did not grow any bacteria.  There was no evidence of a UTI.  Please let me know if you symptoms do not resolve.

## 2022-02-10 ENCOUNTER — Ambulatory Visit: Admission: RE | Admit: 2022-02-10 | Payer: 59 | Source: Ambulatory Visit

## 2022-02-16 ENCOUNTER — Ambulatory Visit: Payer: Self-pay

## 2022-02-16 NOTE — Telephone Encounter (Signed)
Patient called, left VM to return the call to the office to discuss symptoms with a nurse. ? ?Summary: ongoing UTI-medication not working  ? Pt called in stating she recently had a UTI and was given medication but, it has not worked completely and wanted to speak with a nurse, please advise.   ?  ? ?

## 2022-02-16 NOTE — Telephone Encounter (Signed)
Please let patient know that her most recent labs did not show any bacterial growth.  She did not have a UTI.  This is likely related to interstitial cystitis which is irritation in the bladder and urethra.  If he symptoms have worsened I'd like her to come back for an appointment to make sure there is no bacteria and we can discuss the interstitial cystitis further.   ?

## 2022-02-16 NOTE — Telephone Encounter (Signed)
?  Chief Complaint: UTI f/up ?Symptoms: urinary frequency, dysuria, and urine darker than normal  ?Frequency: ongoing since 5/4 ?Pertinent Negatives: Patient denies blood in urine or back pain  ?Disposition: '[]'$ ED /'[]'$ Urgent Care (no appt availability in office) / '[]'$ Appointment(In office/virtual)/ '[]'$  [facility]/ '[]'$ Home Care/ '[]'$ Refused Recommended Disposition /'[]'$ [facility]/ '[x]'$  Follow-up with PCP ?Additional Notes: pt states from her OV on 02/03/22 Santiago Glad, NP told her to call back if symptoms didn't resolve and pt states she is needing a few more days of the abx since she is still having sx. Per OV notes didn't specify for pt to come back and pt preferred to have abx called in but if needed to come back in she could. Pt would like CB of decision. Advised her I will send message and would be after lunch before someone got back with her. Pt verbalized understanding.  ?  ?   ?Summary: ongoing UTI-medication not working  ?  Pt called in stating she recently had a UTI and was given medication but, it has not worked completely and wanted to speak with a nurse, please advise.   ?  ? ?Reason for Disposition ? Urinating more frequently than usual (i.e., frequency) ? ?Answer Assessment - Initial Assessment Questions ?1. SYMPTOM: "What's the main symptom you're concerned about?" (e.g., frequency, incontinence) ?    Frequency, burning, darkened ?2. ONSET: "When did the  sx  start?" ?    Beginning of the month ?3. PAIN: "Is there any pain?" If Yes, ask: "How bad is it?" (Scale: 1-10; mild, moderate, severe) ?    Mild  ?5. OTHER SYMPTOMS: "Do you have any other symptoms?" (e.g., fever, flank pain, blood in urine, pain with urination) ?    No ? ?Protocols used: Urinary Symptoms-A-AH ? ?

## 2022-02-16 NOTE — Telephone Encounter (Signed)
Patient states she is still having symptoms, scheduled an appointment for tomorrow to discuss symptoms.  ?

## 2022-02-17 ENCOUNTER — Ambulatory Visit (INDEPENDENT_AMBULATORY_CARE_PROVIDER_SITE_OTHER): Payer: 59 | Admitting: Nurse Practitioner

## 2022-02-17 ENCOUNTER — Encounter: Payer: Self-pay | Admitting: Nurse Practitioner

## 2022-02-17 VITALS — BP 123/81 | HR 61 | Temp 97.8°F | Wt 205.2 lb

## 2022-02-17 DIAGNOSIS — R350 Frequency of micturition: Secondary | ICD-10-CM | POA: Diagnosis not present

## 2022-02-17 DIAGNOSIS — N3000 Acute cystitis without hematuria: Secondary | ICD-10-CM | POA: Diagnosis not present

## 2022-02-17 LAB — URINALYSIS, ROUTINE W REFLEX MICROSCOPIC
Bilirubin, UA: NEGATIVE
Glucose, UA: NEGATIVE
Ketones, UA: NEGATIVE
Nitrite, UA: NEGATIVE
Protein,UA: NEGATIVE
RBC, UA: NEGATIVE
Specific Gravity, UA: 1.025 (ref 1.005–1.030)
Urobilinogen, Ur: 0.2 mg/dL (ref 0.2–1.0)
pH, UA: 6.5 (ref 5.0–7.5)

## 2022-02-17 LAB — MICROSCOPIC EXAMINATION: RBC, Urine: NONE SEEN /hpf (ref 0–2)

## 2022-02-17 MED ORDER — SULFAMETHOXAZOLE-TRIMETHOPRIM 800-160 MG PO TABS: 1.0000 | ORAL_TABLET | Freq: Two times a day (BID) | ORAL | 0 refills | Status: DC

## 2022-02-17 NOTE — Progress Notes (Signed)
Results discussed with patient during visit today.

## 2022-02-17 NOTE — Progress Notes (Signed)
BP 123/81   Pulse 61   Temp 97.8 F (36.6 C) (Oral)   Wt 205 lb 3.2 oz (93.1 kg)   SpO2 98%   BMI 35.22 kg/m    Subjective:    Patient ID: Amanda Ochoa, female    DOB: 13-May-1951, 71 y.o.   MRN: [id number]  HPI: Amanda Ochoa is a 71 y.o. female  Chief Complaint  Patient presents with   Urinary Frequency    Dark urine, dysuria ongoing since last visit    URINARY SYMPTOMS Dysuria: yes Urinary frequency:  some Urgency:  very little Small volume voids: no Symptom severity: no Urinary incontinence: no Foul odor: no Hematuria: yes Abdominal pain: no Back pain: no Suprapubic pain/pressure: no Flank pain: no Fever:  no Vomiting: no Relief with cranberry juice: no Relief with pyridium: no Status: better Previous urinary tract infection: no Recurrent urinary tract infection: no Patient completed a course of macrobid.  States her symptoms improved for a few days then started back again.    Relevant past medical, surgical, family and social history reviewed and updated as indicated. Interim medical history since our last visit reviewed. Allergies and medications reviewed and updated.  Review of Systems  Constitutional:  Negative for fever.  Gastrointestinal:  Negative for abdominal pain and vomiting.  Genitourinary:  Positive for dysuria and frequency. Negative for decreased urine volume, flank pain, hematuria and urgency.  Musculoskeletal:  Negative for back pain.   Per HPI unless specifically indicated above     Objective:    BP 123/81   Pulse 61   Temp 97.8 F (36.6 C) (Oral)   Wt 205 lb 3.2 oz (93.1 kg)   SpO2 98%   BMI 35.22 kg/m   Wt Readings from Last 3 Encounters:  02/17/22 205 lb 3.2 oz (93.1 kg)  02/03/22 206 lb 3.2 oz (93.5 kg)  01/12/22 203 lb (92.1 kg)    Physical Exam Vitals and nursing note reviewed.  Constitutional:      General: She is not in acute distress.    Appearance: Normal appearance. She is normal weight. She is not  ill-appearing, toxic-appearing or diaphoretic.  HENT:     Head: Normocephalic.     Right Ear: External ear normal.     Left Ear: External ear normal.     Nose: Nose normal.     Mouth/Throat:     Mouth: Mucous membranes are moist.     Pharynx: Oropharynx is clear.  Eyes:     General:        Right eye: No discharge.        Left eye: No discharge.     Extraocular Movements: Extraocular movements intact.     Conjunctiva/sclera: Conjunctivae normal.     Pupils: Pupils are equal, round, and reactive to light.  Cardiovascular:     Rate and Rhythm: Normal rate and regular rhythm.     Heart sounds: No murmur heard. Pulmonary:     Effort: Pulmonary effort is normal. No respiratory distress.     Breath sounds: Normal breath sounds. No wheezing or rales.  Abdominal:     General: Abdomen is flat. Bowel sounds are normal. There is no distension.     Palpations: Abdomen is soft.     Tenderness: There is no abdominal tenderness. There is no right CVA tenderness, left CVA tenderness or guarding.  Musculoskeletal:     Cervical back: Normal range of motion and neck supple.  Skin:    General: Skin is warm  and dry.     Capillary Refill: Capillary refill takes less than 2 seconds.  Neurological:     General: No focal deficit present.     Mental Status: She is alert and oriented to person, place, and time. Mental status is at baseline.  Psychiatric:        Mood and Affect: Mood normal.        Behavior: Behavior normal.        Thought Content: Thought content normal.        Judgment: Judgment normal.    Results for orders placed or performed in visit on 02/03/22  Urine Culture   Specimen: Urine   UR  Result Value Ref Range   Urine Culture, Routine Final report    Organism ID, Bacteria Comment   Microscopic Examination   Urine  Result Value Ref Range   WBC, UA 0-5 0 - 5 /hpf   RBC None seen 0 - 2 /hpf   Epithelial Cells (non renal) 0-10 0 - 10 /hpf   Bacteria, UA None seen None seen/Few   Comp Met (CMET)  Result Value Ref Range   Glucose 77 70 - 99 mg/dL   BUN 14 8 - 27 mg/dL   Creatinine, Ser 0.68 0.57 - 1.00 mg/dL   eGFR 94 >59 mL/min/1.73   BUN/Creatinine Ratio 21 12 - 28   Sodium 142 134 - 144 mmol/L   Potassium 4.0 3.5 - 5.2 mmol/L   Chloride 105 96 - 106 mmol/L   CO2 25 20 - 29 mmol/L   Calcium 8.9 8.7 - 10.3 mg/dL   Total Protein 6.3 6.0 - 8.5 g/dL   Albumin 3.9 3.8 - 4.8 g/dL   Globulin, Total 2.4 1.5 - 4.5 g/dL   Albumin/Globulin Ratio 1.6 1.2 - 2.2   Bilirubin Total 0.3 0.0 - 1.2 mg/dL   Alkaline Phosphatase 78 44 - 121 IU/L   AST 22 0 - 40 IU/L   ALT 24 0 - 32 IU/L  Lipid Profile  Result Value Ref Range   Cholesterol, Total 206 (H) 100 - 199 mg/dL   Triglycerides 184 (H) 0 - 149 mg/dL   HDL 50 >39 mg/dL   VLDL Cholesterol Cal 32 5 - 40 mg/dL   LDL Chol Calc (NIH) 124 (H) 0 - 99 mg/dL   Chol/HDL Ratio 4.1 0.0 - 4.4 ratio  HgB A1c  Result Value Ref Range   Hgb A1c MFr Bld 5.8 (H) 4.8 - 5.6 %   Est. average glucose Bld gHb Est-mCnc 120 mg/dL  Urinalysis, Routine w reflex microscopic  Result Value Ref Range   Specific Gravity, UA <1.005 (L) 1.005 - 1.030   pH, UA 6.0 5.0 - 7.5   Color, UA Yellow Yellow   Appearance Ur Clear Clear   Leukocytes,UA Trace (A) Negative   Protein,UA Negative Negative/Trace   Glucose, UA Negative Negative   Ketones, UA Negative Negative   RBC, UA Negative Negative   Bilirubin, UA Negative Negative   Urobilinogen, Ur 0.2 0.2 - 1.0 mg/dL   Nitrite, UA Negative Negative   Microscopic Examination See below:       Assessment & Plan:   Problem List Items Addressed This Visit   None Visit Diagnoses     Acute cystitis without hematuria    -  Primary   1+leuks. Bactrim sent. Increase water to at least 64 ounces daily. Avoid caffeine and cranberry juice. Discussed interstitial cystitis. FU if not improved.   Relevant Orders   Urine  Culture   Urinary frequency       Relevant Orders   Urinalysis, Routine w reflex  microscopic        Follow up plan: Return if symptoms worsen or fail to improve.

## 2022-02-19 LAB — URINE CULTURE

## 2022-02-21 NOTE — Progress Notes (Signed)
HI [name].  Your urine did not grow any bacteria.  Please let me know if your symptoms persist.

## 2022-03-15 ENCOUNTER — Ambulatory Visit (INDEPENDENT_AMBULATORY_CARE_PROVIDER_SITE_OTHER): Payer: 59 | Admitting: *Deleted

## 2022-03-15 DIAGNOSIS — Z0000 Encounter for general adult medical examination without abnormal findings: Secondary | ICD-10-CM

## 2022-03-15 NOTE — Patient Instructions (Signed)
Amanda Ochoa , Thank you for taking time to come for your Medicare Wellness Visit. I appreciate your ongoing commitment to your health goals. Please review the following plan we discussed and let me know if I can assist you in the future.   Screening recommendations/referrals: Colonoscopy: up to date Mammogram: up to date Bone Density: up to date Recommended yearly ophthalmology/optometry visit for glaucoma screening and checkup Recommended yearly dental visit for hygiene and checkup  Vaccinations: Influenza vaccine: up to date Pneumococcal vaccine: up to date Tdap vaccine: up to date Shingles vaccine: up to date    Advanced directives: Education provided  Conditions/risks identified:   Next appointment: 08-08-2022 @ 9:20  [facility] 33 Years and Older, Female Preventive care refers to lifestyle choices and visits with your health care provider that can promote health and wellness. What does preventive care include? A yearly physical exam. This is also called an annual well check. Dental exams once or twice a year. Routine eye exams. Ask your health care provider how often you should have your eyes checked. Personal lifestyle choices, including: Daily care of your teeth and gums. Regular physical activity. Eating a healthy diet. Avoiding tobacco and drug use. Limiting alcohol use. Practicing safe sex. Taking low-dose aspirin every day. Taking vitamin and mineral supplements as recommended by your health care provider. What happens during an annual well check? The services and screenings done by your health care provider during your annual well check will depend on your age, overall health, lifestyle risk factors, and family history of disease. Counseling  Your health care provider may ask you questions about your: Alcohol use. Tobacco use. Drug use. Emotional well-being. Home and relationship well-being. Sexual activity. Eating habits. History of  falls. Memory and ability to understand (cognition). Work and work Statistician. Reproductive health. Screening  You may have the following tests or measurements: Height, weight, and BMI. Blood pressure. Lipid and cholesterol levels. These may be checked every 5 years, or more frequently if you are over 52 years old. Skin check. Lung cancer screening. You may have this screening every year starting at age 73 if you have a 30-pack-year history of smoking and currently smoke or have quit within the past 15 years. Fecal occult blood test (FOBT) of the stool. You may have this test every year starting at age 50. Flexible sigmoidoscopy or colonoscopy. You may have a sigmoidoscopy every 5 years or a colonoscopy every 10 years starting at age 53. Hepatitis C blood test. Hepatitis B blood test. Sexually transmitted disease (STD) testing. Diabetes screening. This is done by checking your blood sugar (glucose) after you have not eaten for a while (fasting). You may have this done every 1-3 years. Bone density scan. This is done to screen for osteoporosis. You may have this done starting at age 64. Mammogram. This may be done every 1-2 years. Talk to your health care provider about how often you should have regular mammograms. Talk with your health care provider about your test results, treatment options, and if necessary, the need for more tests. Vaccines  Your health care provider may recommend certain vaccines, such as: Influenza vaccine. This is recommended every year. Tetanus, diphtheria, and acellular pertussis (Tdap, Td) vaccine. You may need a Td booster every 10 years. Zoster vaccine. You may need this after age 36. Pneumococcal 13-valent conjugate (PCV13) vaccine. One dose is recommended after age 31. Pneumococcal polysaccharide (PPSV23) vaccine. One dose is recommended after age 27. Talk to your health  care provider about which screenings and vaccines you need and how often you need  them. This information is not intended to replace advice given to you by your health care provider. Make sure you discuss any questions you have with your health care provider. Document Released: 10/16/2015 Document Revised: 06/08/2016 Document Reviewed: 07/21/2015 Elsevier Interactive Patient Education  2017 [employer] Prevention in the Home Falls can cause injuries. They can happen to people of all ages. There are many things you can do to make your home safe and to help prevent falls. What can I do on the outside of my home? Regularly fix the edges of walkways and driveways and fix any cracks. Remove anything that might make you trip as you walk through a door, such as a raised step or threshold. Trim any bushes or trees on the path to your home. Use bright outdoor lighting. Clear any walking paths of anything that might make someone trip, such as rocks or tools. Regularly check to see if handrails are loose or broken. Make sure that both sides of any steps have handrails. Any raised decks and porches should have guardrails on the edges. Have any leaves, snow, or ice cleared regularly. Use sand or salt on walking paths during winter. Clean up any spills in your garage right away. This includes oil or grease spills. What can I do in the bathroom? Use night lights. Install grab bars by the toilet and in the tub and shower. Do not use towel bars as grab bars. Use non-skid mats or decals in the tub or shower. If you need to sit down in the shower, use a plastic, non-slip stool. Keep the floor dry. Clean up any water that spills on the floor as soon as it happens. Remove soap buildup in the tub or shower regularly. Attach bath mats securely with double-sided non-slip rug tape. Do not have throw rugs and other things on the floor that can make you trip. What can I do in the bedroom? Use night lights. Make sure that you have a light by your bed that is easy to reach. Do not use  any sheets or blankets that are too big for your bed. They should not hang down onto the floor. Have a firm chair that has side arms. You can use this for support while you get dressed. Do not have throw rugs and other things on the floor that can make you trip. What can I do in the kitchen? Clean up any spills right away. Avoid walking on wet floors. Keep items that you use a lot in easy-to-reach places. If you need to reach something above you, use a strong step stool that has a grab bar. Keep electrical cords out of the way. Do not use floor polish or wax that makes floors slippery. If you must use wax, use non-skid floor wax. Do not have throw rugs and other things on the floor that can make you trip. What can I do with my stairs? Do not leave any items on the stairs. Make sure that there are handrails on both sides of the stairs and use them. Fix handrails that are broken or loose. Make sure that handrails are as long as the stairways. Check any carpeting to make sure that it is firmly attached to the stairs. Fix any carpet that is loose or worn. Avoid having throw rugs at the top or bottom of the stairs. If you do have throw rugs, attach them to  the floor with carpet tape. Make sure that you have a light switch at the top of the stairs and the bottom of the stairs. If you do not have them, ask someone to add them for you. What else can I do to help prevent falls? Wear shoes that: Do not have high heels. Have rubber bottoms. Are comfortable and fit you well. Are closed at the toe. Do not wear sandals. If you use a stepladder: Make sure that it is fully opened. Do not climb a closed stepladder. Make sure that both sides of the stepladder are locked into place. Ask someone to hold it for you, if possible. Clearly mark and make sure that you can see: Any grab bars or handrails. First and last steps. Where the edge of each step is. Use tools that help you move around (mobility aids)  if they are needed. These include: Canes. Walkers. Scooters. Crutches. Turn on the lights when you go into a dark area. Replace any light bulbs as soon as they burn out. Set up your furniture so you have a clear path. Avoid moving your furniture around. If any of your floors are uneven, fix them. If there are any pets around you, be aware of where they are. Review your medicines with your doctor. Some medicines can make you feel dizzy. This can increase your chance of falling. Ask your doctor what other things that you can do to help prevent falls. This information is not intended to replace advice given to you by your health care provider. Make sure you discuss any questions you have with your health care provider. Document Released: 07/16/2009 Document Revised: 02/25/2016 Document Reviewed: 10/24/2014 Elsevier Interactive Patient Education  2017 [employer].

## 2022-03-15 NOTE — Progress Notes (Signed)
Subjective:   Amanda Ochoa is a 71 y.o. female who presents for an Initial Medicare Annual Wellness Visit.  I connected with  Amanda Ochoa on 03/15/22 by a telephone enabled telemedicine application and verified that I am speaking with the correct person using two identifiers.   I discussed the limitations of evaluation and management by telemedicine. The patient expressed understanding and agreed to proceed.  Patient location: home  Provider location: tele-health- home   Review of Systems     Cardiac Risk Factors include: advanced age (>76mn, >>27women);hypertension;obesity (BMI >30kg/m2)     Objective:    Today's Vitals   03/15/22 0858  PainSc: 5    There is no height or weight on file to calculate BMI.     03/15/2022    9:11 AM 10/19/2021    8:51 AM  Advanced Directives  Does Patient Have a Medical Advance Directive? No No  Would patient like information on creating a medical advance directive? No - Patient declined     Current Medications (verified) Outpatient Encounter Medications as of 03/15/2022  Medication Sig   alendronate (FOSAMAX) 70 MG tablet Take 1 tablet (70 mg total) by mouth every 7 (seven) days. Take with a full glass of water on an empty stomach.   amLODipine (NORVASC) 10 MG tablet Take 1 tablet (10 mg total) by mouth daily.   Apoaequorin (PREVAGEN PO) Take by mouth.   Calcium-Vitamin D-Vitamin K (CALCIUM FOR WOMEN PO) Take by mouth.   carvedilol (COREG) 25 MG tablet Take 1 tablet (25 mg total) by mouth 2 (two) times daily with a meal.   cholecalciferol (VITAMIN D) 25 MCG (1000 UNIT) tablet Take 1,000 Units by mouth daily.   clopidogrel (PLAVIX) 75 MG tablet Take 1 tablet (75 mg total) by mouth daily.   losartan (COZAAR) 50 MG tablet Take 1 tablet (50 mg total) by mouth daily.   Multiple Minerals-Vitamins (CALCIUM-MAGNESIUM-ZINC-D3) TABS Take 1 tablet by mouth daily.   Multiple Vitamins-Minerals (WOMENS MULTI GUMMIES PO) Take by mouth.    rosuvastatin (CRESTOR) 5 MG tablet Take 1 tablet (5 mg total) by mouth daily.   Vitamin A 2400 MCG (8000 UT) TABS Take by mouth.   predniSONE (DELTASONE) 10 MG tablet TAKE 6 TABLETS DAILY FOR 2 DAYS, THEN REDUCE BY 1 TABLET EVERY 2 DAYS UNTIL GONE   sulfamethoxazole-trimethoprim (BACTRIM DS) 800-160 MG tablet Take 1 tablet by mouth 2 (two) times daily.   No facility-administered encounter medications on file as of 03/15/2022.    Allergies (verified) Penicillins   History: Past Medical History:  Diagnosis Date   Abdominal aneurysm (HCC)    Diverticulitis    GERD (gastroesophageal reflux disease)    Hypertension    Sciatica    Sleep apnea    Past Surgical History:  Procedure Laterality Date   ABDOMINAL HYSTERECTOMY     COLONOSCOPY WITH PROPOFOL N/A 10/19/2021   Procedure: COLONOSCOPY WITH PROPOFOL;  Surgeon: WLucilla Lame MD;  Location: [facility] ENDOSCOPY;  Service: Endoscopy;  Laterality: N/A;   CORONARY ANGIOPLASTY WITH STENT PLACEMENT     TOTAL ABDOMINAL HYSTERECTOMY W/ BILATERAL SALPINGOOPHORECTOMY     Family History  Problem Relation Age of Onset   Diabetes Mother    Healthy Daughter    Diabetes Maternal Grandmother    Heart disease Maternal Grandmother    Healthy Maternal Grandfather    Healthy Brother    Healthy Son    Breast cancer Neg Hx    Social History   Socioeconomic History  Marital status: Single    Spouse name: Not on file   Number of children: Not on file   Years of education: Not on file   Highest education level: Not on file  Occupational History   Not on file  Tobacco Use   Smoking status: Never   Smokeless tobacco: Never  Vaping Use   Vaping Use: Never used  Substance and Sexual Activity   Alcohol use: Never   Drug use: Never   Sexual activity: Not Currently  Other Topics Concern   Not on file  Social History Narrative   Not on file   Social Determinants of Health   Financial Resource Strain: Low Risk  (03/15/2022)   Overall Financial  Resource Strain (CARDIA)    Difficulty of Paying Living Expenses: Not hard at all  Food Insecurity: No Food Insecurity (03/15/2022)   Hunger Vital Sign    Worried About Running Out of Food in the Last Year: Never true    Ran Out of Food in the Last Year: Never true  Transportation Needs: No Transportation Needs (03/15/2022)   PRAPARE - Hydrologist (Medical): No    Lack of Transportation (Non-Medical): No  Physical Activity: Insufficiently Active (03/15/2022)   Exercise Vital Sign    Days of Exercise per Week: 3 days    Minutes of Exercise per Session: 20 min  Stress: No Stress Concern Present (03/15/2022)   Ray    Feeling of Stress : Not at all  Social Connections: Moderately Isolated (03/15/2022)   Social Connection and Isolation Panel [NHANES]    Frequency of Communication with Friends and Family: More than three times a week    Frequency of Social Gatherings with Friends and Family: Twice a week    Attends Religious Services: Never    Marine scientist or Organizations: Yes    Attends Music therapist: 1 to 4 times per year    Marital Status: Separated    Tobacco Counseling Counseling given: Not Answered   Clinical Intake:  Pre-visit preparation completed: Yes  Pain : 0-10 Pain Score: 5  Pain Type: Chronic pain Pain Location: Leg Pain Orientation: Right Pain Descriptors / Indicators: Aching, Burning Pain Onset: More than a month ago Pain Frequency: Intermittent Pain Relieving Factors: epson salt Effect of Pain on Daily Activities: yes  Pain Relieving Factors: epson salt  Nutritional Risks: None Diabetes: No  How often do you need to have someone help you when you read instructions, pamphlets, or other written materials from your doctor or pharmacy?: 1 - Never  Diabetic?  no  Interpreter Needed?: No  Information entered by :: Leroy Kennedy  LPN   Activities of Daily Living    03/15/2022    9:08 AM 05/06/2021    1:11 PM  In your present state of health, do you have any difficulty performing the following activities:  Hearing? 1 0  Vision? 0 0  Difficulty concentrating or making decisions? 0 0  Walking or climbing stairs? 1 0  Dressing or bathing? 0 0  Doing errands, shopping? 0 0  Preparing Food and eating ? N   Using the Toilet? N   In the past six months, have you accidently leaked urine? N   Do you have problems with loss of bowel control? N   Managing your Medications? N   Managing your Finances? N   Housekeeping or managing your Housekeeping? N  Patient Care Team: Jon Billings, NP as PCP - General (Nurse Practitioner)  Indicate any recent Medical Services you may have received from other than [facility] providers in the past year (date may be approximate).     Assessment:   This is a routine wellness examination for Izzah.  Hearing/Vision screen Hearing Screening - Comments:: Has hearing aids Vision Screening - Comments:: Up to date [facility]  Dietary issues and exercise activities discussed: Current Exercise Habits: Home exercise routine, Type of exercise: stretching, Time (Minutes): 20, Frequency (Times/Week): 3, Weekly Exercise (Minutes/Week): 60, Intensity: Mild, Exercise limited by: orthopedic condition(s)   Goals Addressed             This Visit's Progress    Weight (lb) < 200 lb (90.7 kg)        Depression Screen    03/15/2022    9:11 AM 02/17/2022    8:41 AM 02/03/2022   11:07 AM 01/12/2022   10:15 AM 08/06/2021   11:17 AM 06/14/2021   11:32 AM  PHQ 2/9 Scores  PHQ - 2 Score 0 0 0 0 0 0  PHQ- 9 Score 0 0 0 0 0     Fall Risk    03/15/2022    9:18 AM 02/17/2022    8:40 AM 02/03/2022   11:07 AM 01/12/2022   10:16 AM 08/06/2021   11:17 AM  Fall Risk   Falls in the past year? 1 0 0 0 0  Number falls in past yr: 0 0 0 0 0  Injury with Fall? 0 0 0 0 0  Risk for fall due to :  No  Fall Risks No Fall Risks No Fall Risks No Fall Risks  Follow up Falls evaluation completed;Education provided;Falls prevention discussed Falls evaluation completed Falls evaluation completed Falls evaluation completed Falls evaluation completed    FALL RISK PREVENTION PERTAINING TO THE HOME:  Any stairs in or around the home? No  If so, are there any without handrails? No  Home free of loose throw rugs in walkways, pet beds, electrical cords, etc? Yes  Adequate lighting in your home to reduce risk of falls? Yes   ASSISTIVE DEVICES UTILIZED TO PREVENT FALLS:  Life alert? No  Use of a cane, walker or w/c? Yes  Grab bars in the bathroom? Yes  Shower chair or bench in shower? Yes  Elevated toilet seat or a handicapped toilet? Yes   TIMED UP AND GO:  Was the test performed? No .   Cognitive Function:        03/15/2022    9:01 AM  6CIT Screen  What Year? 0 points  What month? 0 points  What time? 0 points  Count back from 20 0 points  Months in reverse 0 points  Repeat phrase 0 points  Total Score 0 points    Immunizations Immunization History  Administered Date(s) Administered   Fluad Quad(high Dose 65+) 06/14/2021   Hepatitis B 08/30/2017, 10/01/2017, 04/06/2018   Influenza-Unspecified 10/08/2015, 06/30/2016, 08/30/2017, 07/21/2018, 06/02/2019   Moderna Sars-Covid-2 Vaccination 01/03/2020, 02/03/2020, 10/24/2020   PPD Test 04/06/2021   Pneumococcal Conjugate-13 05/01/2014, 10/19/2017   Pneumococcal Polysaccharide-23 09/16/2019, 12/17/2019   Tdap 05/01/2014, 05/04/2016, 10/01/2017   Zoster Recombinat (Shingrix) 09/16/2019, 12/17/2019   Zoster, Live 05/01/2014    TDAP status: Up to date  Flu Vaccine status: Up to date  Pneumococcal vaccine status: Up to date  Covid-19 vaccine status: Information provided on how to obtain vaccines.   Qualifies for Shingles Vaccine? No  Zostavax completed Yes   Shingrix Completed?: Yes  Screening Tests Health Maintenance   Topic Date Due   Hepatitis C Screening  Never done   COVID-19 Vaccine (4 - Moderna series) 12/19/2020   INFLUENZA VACCINE  05/03/2022   MAMMOGRAM  11/25/2023   TETANUS/TDAP  10/02/2027   COLONOSCOPY (Pts 45-84yr Insurance coverage will need to be confirmed)  10/19/2028   Pneumonia Vaccine 71 Years old  Completed   DEXA SCAN  Completed   Zoster Vaccines- Shingrix  Completed   HPV VACCINES  Aged Out    Health Maintenance  Health Maintenance Due  Topic Date Due   Hepatitis C Screening  Never done   COVID-19 Vaccine (4 - Moderna series) 12/19/2020    Colorectal cancer screening: Type of screening: Colonoscopy. Completed 2023. Repeat every 7 years  Mammogram status: Completed  . Repeat every year  Bone Density status: Completed 2022. Results reflect: Bone density results: OSTEOPOROSIS. Repeat every 2 years.  Lung Cancer Screening: (Low Dose CT Chest recommended if Age 69-80years, 30 pack-year currently smoking OR have quit w/in 15years.) does not qualify.   Lung Cancer Screening Referral:   Additional Screening:  Hepatitis C Screening: does qualify;   Vision Screening: Recommended annual ophthalmology exams for early detection of glaucoma and other disorders of the eye. Is the patient up to date with their annual eye exam?  Yes  Who is the provider or what is the name of the office in which the patient attends annual eye exams? [facility] If pt is not established with a provider, would they like to be referred to a provider to establish care? No .   Dental Screening: Recommended annual dental exams for proper oral hygiene  Community Resource Referral / Chronic Care Management: CRR required this visit?  No   CCM required this visit?  No      Plan:     I have personally reviewed and noted the following in the patient's chart:   Medical and social history Use of alcohol, tobacco or illicit drugs  Current medications and supplements including opioid  prescriptions. Patient is not currently taking opioid prescriptions. Functional ability and status Nutritional status Physical activity Advanced directives List of other physicians Hospitalizations, surgeries, and ER visits in previous 12 months Vitals Screenings to include cognitive, depression, and falls Referrals and appointments  In addition, I have reviewed and discussed with patient certain preventive protocols, quality metrics, and best practice recommendations. A written personalized care plan for preventive services as well as general preventive health recommendations were provided to patient.     JLeroy Kennedy LPN   [id number]  Nurse Notes:

## 2022-07-19 ENCOUNTER — Other Ambulatory Visit: Payer: Self-pay | Admitting: Nurse Practitioner

## 2022-07-19 NOTE — Telephone Encounter (Signed)
Medication Refill - Medication: alendronate (FOSAMAX) 70 MG tablet   Has the patient contacted their pharmacy? No. (Agent: If no, request that the patient contact the pharmacy for the refill. If patient does not wish to contact the pharmacy document the reason why and proceed with request.) (Agent: If yes, when and what did the pharmacy advise?)  Preferred Pharmacy (with phone number or street name):  [facility]),  - [address] Phone:  [phone]  Fax:  [phone]     Has the patient been seen for an appointment in the last year OR does the patient have an upcoming appointment? Yes.    Agent: Please be advised that RX refills may take up to 3 business days. We ask that you follow-up with your pharmacy.

## 2022-07-19 NOTE — Telephone Encounter (Signed)
Requested medication (s) are due for refill today: yes  Requested medication (s) are on the active medication list: yes  Last refill:  07/01/21  Future visit scheduled: yes  Notes to clinic:  Unable to refill per protocol, last refill by another provider. Routing for review     Requested Prescriptions  Pending Prescriptions Disp Refills   alendronate (FOSAMAX) 70 MG tablet 4 tablet 11    Sig: Take 1 tablet (70 mg total) by mouth every 7 (seven) days. Take with a full glass of water on an empty stomach.     Endocrinology:  Bisphosphonates Failed - 07/19/2022 11:39 AM      Failed - Vitamin D in normal range and within 360 days    No results found for: "OO8757VJ2", "QA0601VI1", "VD125OH2TOT", "25OHVITD3", "25OHVITD2", "25OHVITD1", "VD25OH"       Failed - Mg Level in normal range and within 360 days    No results found for: "MG"       Failed - Phosphate in normal range and within 360 days    No results found for: "PHOS"       Passed - Ca in normal range and within 360 days    Calcium  Date Value Ref Range Status  02/03/2022 8.9 8.7 - 10.3 mg/dL Final         Passed - Cr in normal range and within 360 days    Creatinine, Ser  Date Value Ref Range Status  02/03/2022 0.68 0.57 - 1.00 mg/dL Final         Passed - eGFR is 30 or above and within 360 days    eGFR  Date Value Ref Range Status  02/03/2022 94 >59 mL/min/1.73 Final         Passed - Valid encounter within last 12 months    Recent Outpatient Visits           5 months ago Acute cystitis without hematuria   [place], NP   5 months ago Primary hypertension   [facility] Jon Billings, NP   6 months ago Upper arm joint pain, right   [place] [place], NP   11 months ago Primary hypertension   [facility] Jon Billings, NP   1 year ago Urinary frequency   [place], Scheryl Darter, NP       Future  Appointments             In 2 weeks Jon Billings, NP [facility]            Passed - Bone Mineral Density or Dexa Scan completed in the last 2 years

## 2022-07-20 MED ORDER — ALENDRONATE SODIUM 70 MG PO TABS: 70.0000 mg | ORAL_TABLET | ORAL | 11 refills | Status: DC

## 2022-08-01 ENCOUNTER — Encounter (INDEPENDENT_AMBULATORY_CARE_PROVIDER_SITE_OTHER): Payer: Self-pay

## 2022-08-04 DIAGNOSIS — E782 Mixed hyperlipidemia: Secondary | ICD-10-CM | POA: Insufficient documentation

## 2022-08-04 NOTE — Progress Notes (Signed)
BP 101/61   Pulse (!) 50   Temp 98.3 F (36.8 C) (Oral)   Ht 5' 3.39" (1.61 m)   Wt 205 lb 9.6 oz (93.3 kg)   SpO2 96%   BMI 35.98 kg/m    Subjective:    Patient ID: Amanda Ochoa, female    DOB: Feb 06, 1951, 71 y.o.   MRN: [id number]  HPI: Amanda Ochoa is a 71 y.o. female presenting on 08/08/2022 for comprehensive medical examination. Current medical complaints include:none  She currently lives with: Menopausal Symptoms: no  HYPERTENSION without Chronic Kidney Disease Hypertension status: controlled  Satisfied with current treatment? yes Duration of hypertension: years BP monitoring frequency:  daily BP range: 118/72 BP medication side effects:  no Medication compliance: excellent compliance Previous BP meds:amlodipine, carvedilol, and losartan (cozaar) Aspirin: no Recurrent headaches: no Visual changes: no Palpitations: no Dyspnea: no Chest pain: no Lower extremity edema: no Dizzy/lightheaded: no   Depression Screen done today and results listed below:     08/08/2022    9:25 AM 03/15/2022    9:11 AM 02/17/2022    8:41 AM 02/03/2022   11:07 AM 01/12/2022   10:15 AM  Depression screen PHQ 2/9  Decreased Interest 0 0 0 0 0  Down, Depressed, Hopeless 0 0 0 0 0  PHQ - 2 Score 0 0 0 0 0  Altered sleeping 0 0 0 0 0  Tired, decreased energy 0 0 0 0 0  Change in appetite 0 0 0 0 0  Feeling bad or failure about yourself  0 0 0 0 0  Trouble concentrating 0 0 0 0 0  Moving slowly or fidgety/restless 0 0 0 0 0  Suicidal thoughts 0 0 0 0 0  PHQ-9 Score 0 0 0 0 0  Difficult doing work/chores Not difficult at all Not difficult at all Not difficult at all Not difficult at all Not difficult at all    The patient does not have a history of falls. I did complete a risk assessment for falls. A plan of care for falls was documented.   Past Medical History:  Past Medical History:  Diagnosis Date   Abdominal aneurysm ([place])    Diverticulitis    GERD (gastroesophageal reflux  disease)    Hypertension    Sciatica    Sleep apnea     Surgical History:  Past Surgical History:  Procedure Laterality Date   ABDOMINAL HYSTERECTOMY     COLONOSCOPY WITH PROPOFOL N/A 10/19/2021   Procedure: COLONOSCOPY WITH PROPOFOL;  Surgeon: Lucilla Lame, MD;  Location: [facility] ENDOSCOPY;  Service: Endoscopy;  Laterality: N/A;   CORONARY ANGIOPLASTY WITH STENT PLACEMENT     TOTAL ABDOMINAL HYSTERECTOMY W/ BILATERAL SALPINGOOPHORECTOMY      Medications:  Current Outpatient Medications on File Prior to Visit  Medication Sig   alendronate (FOSAMAX) 70 MG tablet Take 1 tablet (70 mg total) by mouth every 7 (seven) days. Take with a full glass of water on an empty stomach.   Multiple Minerals-Vitamins (CALCIUM-MAGNESIUM-ZINC-D3) TABS Take 1 tablet by mouth daily.   Multiple Vitamins-Minerals (WOMENS MULTI GUMMIES PO) Take by mouth.   No current facility-administered medications on file prior to visit.    Allergies:  Allergies  Allergen Reactions   Penicillins Itching and Rash    Social History:  Social History   Socioeconomic History   Marital status: Single    Spouse name: Not on file   Number of children: Not on file   Years of education: Not on  file   Highest education level: Not on file  Occupational History   Not on file  Tobacco Use   Smoking status: Never   Smokeless tobacco: Never  Vaping Use   Vaping Use: Never used  Substance and Sexual Activity   Alcohol use: Never   Drug use: Never   Sexual activity: Not Currently  Other Topics Concern   Not on file  Social History Narrative   Not on file   Social Determinants of Health   Financial Resource Strain: Low Risk  (03/15/2022)   Overall Financial Resource Strain (CARDIA)    Difficulty of Paying Living Expenses: Not hard at all  Food Insecurity: No Food Insecurity (03/15/2022)   Hunger Vital Sign    Worried About Running Out of Food in the Last Year: Never true    Ran Out of Food in the Last Year: Never  true  Transportation Needs: No Transportation Needs (03/15/2022)   PRAPARE - Hydrologist (Medical): No    Lack of Transportation (Non-Medical): No  Physical Activity: Insufficiently Active (03/15/2022)   Exercise Vital Sign    Days of Exercise per Week: 3 days    Minutes of Exercise per Session: 20 min  Stress: No Stress Concern Present (03/15/2022)   [place]    Feeling of Stress : Not at all  Social Connections: Moderately Isolated (03/15/2022)   Social Connection and Isolation Panel [NHANES]    Frequency of Communication with Friends and Family: More than three times a week    Frequency of Social Gatherings with Friends and Family: Twice a week    Attends Religious Services: Never    Marine scientist or Organizations: Yes    Attends Archivist Meetings: 1 to 4 times per year    Marital Status: Separated  Intimate Partner Violence: Not At Risk (03/15/2022)   Humiliation, Afraid, Rape, and Kick questionnaire    Fear of Current or Ex-Partner: No    Emotionally Abused: No    Physically Abused: No    Sexually Abused: No   Social History   Tobacco Use  Smoking Status Never  Smokeless Tobacco Never   Social History   Substance and Sexual Activity  Alcohol Use Never    Family History:  Family History  Problem Relation Age of Onset   Diabetes Mother    Healthy Daughter    Diabetes Maternal Grandmother    Heart disease Maternal Grandmother    Healthy Maternal Grandfather    Healthy Brother    Healthy Son    Breast cancer Neg Hx     Past medical history, surgical history, medications, allergies, family history and social history reviewed with patient today and changes made to appropriate areas of the chart.   Review of Systems  Eyes:  Negative for blurred vision and double vision.  Respiratory:  Negative for shortness of breath.   Cardiovascular:  Negative for  chest pain, palpitations and leg swelling.  Neurological:  Negative for dizziness and headaches.   All other ROS negative except what is listed above and in the HPI.      Objective:    BP 101/61   Pulse (!) 50   Temp 98.3 F (36.8 C) (Oral)   Ht 5' 3.39" (1.61 m)   Wt 205 lb 9.6 oz (93.3 kg)   SpO2 96%   BMI 35.98 kg/m   Wt Readings from Last 3 Encounters:  08/08/22  205 lb 9.6 oz (93.3 kg)  02/17/22 205 lb 3.2 oz (93.1 kg)  02/03/22 206 lb 3.2 oz (93.5 kg)    Physical Exam Vitals and nursing note reviewed.  Constitutional:      General: She is awake. She is not in acute distress.    Appearance: Normal appearance. She is well-developed. She is obese. She is not ill-appearing.  HENT:     Head: Normocephalic and atraumatic.     Right Ear: Hearing, tympanic membrane, ear canal and external ear normal. No drainage.     Left Ear: Hearing, tympanic membrane, ear canal and external ear normal. No drainage.     Nose: Nose normal.     Right Sinus: No maxillary sinus tenderness or frontal sinus tenderness.     Left Sinus: No maxillary sinus tenderness or frontal sinus tenderness.     Mouth/Throat:     Mouth: Mucous membranes are moist.     Pharynx: Oropharynx is clear. Uvula midline. No pharyngeal swelling, oropharyngeal exudate or posterior oropharyngeal erythema.  Eyes:     General: Lids are normal.        Right eye: No discharge.        Left eye: No discharge.     Extraocular Movements: Extraocular movements intact.     Conjunctiva/sclera: Conjunctivae normal.     Pupils: Pupils are equal, round, and reactive to light.     Visual Fields: Right eye visual fields normal and left eye visual fields normal.  Neck:     Thyroid: No thyromegaly.     Vascular: No carotid bruit.     Trachea: Trachea normal.  Cardiovascular:     Rate and Rhythm: Normal rate and regular rhythm.     Heart sounds: Normal heart sounds. No murmur heard.    No gallop.  Pulmonary:     Effort: Pulmonary  effort is normal. No accessory muscle usage or respiratory distress.     Breath sounds: Normal breath sounds.  Chest:  Breasts:    Right: Normal.     Left: Normal.  Abdominal:     General: Bowel sounds are normal.     Palpations: Abdomen is soft. There is no hepatomegaly or splenomegaly.     Tenderness: There is no abdominal tenderness.  Musculoskeletal:        General: Normal range of motion.     Cervical back: Normal range of motion and neck supple.     Right lower leg: No edema.     Left lower leg: No edema.  Lymphadenopathy:     Head:     Right side of head: No submental, submandibular, tonsillar, preauricular or posterior auricular adenopathy.     Left side of head: No submental, submandibular, tonsillar, preauricular or posterior auricular adenopathy.     Cervical: No cervical adenopathy.     Upper Body:     Right upper body: No supraclavicular, axillary or pectoral adenopathy.     Left upper body: No supraclavicular, axillary or pectoral adenopathy.  Skin:    General: Skin is warm and dry.     Capillary Refill: Capillary refill takes less than 2 seconds.     Findings: No rash.  Neurological:     Mental Status: She is alert and oriented to person, place, and time.     Gait: Gait is intact.     Deep Tendon Reflexes: Reflexes are normal and symmetric.     Reflex Scores:      Brachioradialis reflexes are 2+ on the  right side and 2+ on the left side.      Patellar reflexes are 2+ on the right side and 2+ on the left side. Psychiatric:        Attention and Perception: Attention normal.        Mood and Affect: Mood normal.        Speech: Speech normal.        Behavior: Behavior normal. Behavior is cooperative.        Thought Content: Thought content normal.        Judgment: Judgment normal.     Results for orders placed or performed in visit on 02/17/22  Urine Culture   Specimen: Urine   UR  Result Value Ref Range   Urine Culture, Routine Final report    Organism ID,  Bacteria Comment   Microscopic Examination   Urine  Result Value Ref Range   WBC, UA 0-5 0 - 5 /hpf   RBC, Urine None seen 0 - 2 /hpf   Epithelial Cells (non renal) 0-10 0 - 10 /hpf   Mucus, UA Present (A) Not Estab.   Bacteria, UA Few (A) None seen/Few  Urinalysis, Routine w reflex microscopic  Result Value Ref Range   Specific Gravity, UA 1.025 1.005 - 1.030   pH, UA 6.5 5.0 - 7.5   Color, UA Yellow Yellow   Appearance Ur Clear Clear   Leukocytes,UA 1+ (A) Negative   Protein,UA Negative Negative/Trace   Glucose, UA Negative Negative   Ketones, UA Negative Negative   RBC, UA Negative Negative   Bilirubin, UA Negative Negative   Urobilinogen, Ur 0.2 0.2 - 1.0 mg/dL   Nitrite, UA Negative Negative   Microscopic Examination See below:       Assessment & Plan:   Problem List Items Addressed This Visit       Cardiovascular and Mediastinum   Hypertension    Chronic.  Controlled.  Continue with current medication regimen of Amlodipine, Carvedilol and Losartan.  Refills sent today. Labs ordered today.  Return to clinic in 6 months for reevaluation.  Call sooner if concerns arise.        Relevant Medications   amLODipine (NORVASC) 10 MG tablet   carvedilol (COREG) 25 MG tablet   losartan (COZAAR) 50 MG tablet   rosuvastatin (CRESTOR) 5 MG tablet     Other   Prediabetes    Labs ordered at visit today.  Will make recommendations based on lab results.        Relevant Orders   HgB A1c   History of abdominal aortic aneurysm (AAA)    Chronic. Had stent placed in [place].  Has not established with Cardiology since moving here. Referral placed for evaluation at visit today.       Relevant Orders   Ambulatory referral to Cardiology   Mixed hyperlipidemia    Chronic.  Controlled.  Continue with current medication regimen of Crestor.  Refills sent. Labs ordered today.  Return to clinic in 6 months for reevaluation.  Call sooner if concerns arise.        Relevant Medications    amLODipine (NORVASC) 10 MG tablet   carvedilol (COREG) 25 MG tablet   losartan (COZAAR) 50 MG tablet   rosuvastatin (CRESTOR) 5 MG tablet   Other Relevant Orders   Lipid panel   Other Visit Diagnoses     Annual physical exam    -  Primary   Health maintenance reviewed during visit today. Labs ordered. Up to  date on vaccines. Mammogram and Colonoscopy up to date.   Relevant Orders   CBC with Differential/Platelet   Comprehensive metabolic panel   Lipid panel   TSH   Urinalysis, Routine w reflex microscopic   HgB A1c   Encounter for hepatitis C screening test for low risk patient       Relevant Orders   Hepatitis C Antibody        Follow up plan: Return in about 6 months (around 02/06/2023) for HTN, HLD, DM2 FU.   LABORATORY TESTING:  - Pap smear: not applicable  IMMUNIZATIONS:   - Tdap: Tetanus vaccination status reviewed: last tetanus booster within 10 years. - Influenza: Up to date - Pneumovax: Up to date - Prevnar: Up to date - COVID: Up to date - HPV: Not applicable - Shingrix vaccine: Up to date  SCREENING: -Mammogram: Up to date  - Colonoscopy: Up to date  - Bone Density: Up to date  -Hearing Test: Not applicable  -Spirometry: Not applicable   PATIENT COUNSELING:   Advised to take 1 mg of folate supplement per day if capable of pregnancy.   Sexuality: Discussed sexually transmitted diseases, partner selection, use of condoms, avoidance of unintended pregnancy  and contraceptive alternatives.   Advised to avoid cigarette smoking.  I discussed with the patient that most people either abstain from alcohol or drink within safe limits (<=14/week and <=4 drinks/occasion for males, <=7/weeks and <= 3 drinks/occasion for females) and that the risk for alcohol disorders and other health effects rises proportionally with the number of drinks per week and how often a drinker exceeds daily limits.  Discussed cessation/primary prevention of drug use and availability  of treatment for abuse.   Diet: Encouraged to adjust caloric intake to maintain  or achieve ideal body weight, to reduce intake of dietary saturated fat and total fat, to limit sodium intake by avoiding high sodium foods and not adding table salt, and to maintain adequate dietary potassium and calcium preferably from fresh fruits, vegetables, and low-fat dairy products.    stressed the importance of regular exercise  Injury prevention: Discussed safety belts, safety helmets, smoke detector, smoking near bedding or upholstery.   Dental health: Discussed importance of regular tooth brushing, flossing, and dental visits.    NEXT PREVENTATIVE PHYSICAL DUE IN 1 YEAR. Return in about 6 months (around 02/06/2023) for HTN, HLD, DM2 FU.

## 2022-08-08 ENCOUNTER — Encounter: Payer: Self-pay | Admitting: Nurse Practitioner

## 2022-08-08 ENCOUNTER — Ambulatory Visit (INDEPENDENT_AMBULATORY_CARE_PROVIDER_SITE_OTHER): Payer: 59 | Admitting: Nurse Practitioner

## 2022-08-08 VITALS — BP 101/61 | HR 50 | Temp 98.3°F | Ht 63.39 in | Wt 205.6 lb

## 2022-08-08 DIAGNOSIS — Z0000 Encounter for general adult medical examination without abnormal findings: Secondary | ICD-10-CM

## 2022-08-08 DIAGNOSIS — I10 Essential (primary) hypertension: Secondary | ICD-10-CM

## 2022-08-08 DIAGNOSIS — Z1159 Encounter for screening for other viral diseases: Secondary | ICD-10-CM

## 2022-08-08 DIAGNOSIS — R7303 Prediabetes: Secondary | ICD-10-CM | POA: Diagnosis not present

## 2022-08-08 DIAGNOSIS — E782 Mixed hyperlipidemia: Secondary | ICD-10-CM | POA: Diagnosis not present

## 2022-08-08 DIAGNOSIS — Z8679 Personal history of other diseases of the circulatory system: Secondary | ICD-10-CM

## 2022-08-08 LAB — URINALYSIS, ROUTINE W REFLEX MICROSCOPIC
Bilirubin, UA: NEGATIVE
Glucose, UA: NEGATIVE
Ketones, UA: NEGATIVE
Leukocytes,UA: NEGATIVE
Nitrite, UA: NEGATIVE
Protein,UA: NEGATIVE
RBC, UA: NEGATIVE
Specific Gravity, UA: 1.01 (ref 1.005–1.030)
Urobilinogen, Ur: 0.2 mg/dL (ref 0.2–1.0)
pH, UA: 7 (ref 5.0–7.5)

## 2022-08-08 MED ORDER — ROSUVASTATIN CALCIUM 5 MG PO TABS: 5.0000 mg | ORAL_TABLET | Freq: Every day | ORAL | 1 refills | Status: DC

## 2022-08-08 MED ORDER — LOSARTAN POTASSIUM 50 MG PO TABS: 50.0000 mg | ORAL_TABLET | Freq: Every day | ORAL | 1 refills | Status: DC

## 2022-08-08 MED ORDER — CARVEDILOL 25 MG PO TABS: 25.0000 mg | ORAL_TABLET | Freq: Two times a day (BID) | ORAL | 1 refills | Status: DC

## 2022-08-08 MED ORDER — AMLODIPINE BESYLATE 10 MG PO TABS: 10.0000 mg | ORAL_TABLET | Freq: Every day | ORAL | 1 refills | Status: DC

## 2022-08-08 MED ORDER — CLOPIDOGREL BISULFATE 75 MG PO TABS: 75.0000 mg | ORAL_TABLET | Freq: Every day | ORAL | 1 refills | Status: DC

## 2022-08-08 NOTE — Assessment & Plan Note (Signed)
Labs ordered at visit today.  Will make recommendations based on lab results.   

## 2022-08-08 NOTE — Assessment & Plan Note (Signed)
Chronic.  Controlled.  Continue with current medication regimen of Amlodipine, Carvedilol and Losartan.  Refills sent today. Labs ordered today.  Return to clinic in 6 months for reevaluation.  Call sooner if concerns arise.

## 2022-08-08 NOTE — Assessment & Plan Note (Signed)
Chronic.  Controlled.  Continue with current medication regimen of Crestor.  Refills sent. Labs ordered today.  Return to clinic in 6 months for reevaluation.  Call sooner if concerns arise.

## 2022-08-08 NOTE — Assessment & Plan Note (Signed)
Chronic. Had stent placed in [place].  Has not established with Cardiology since moving here. Referral placed for evaluation at visit today.

## 2022-08-09 LAB — CBC WITH DIFFERENTIAL/PLATELET
Basophils Absolute: 0 10*3/uL (ref 0.0–0.2)
Basos: 1 %
EOS (ABSOLUTE): 0.1 10*3/uL (ref 0.0–0.4)
Eos: 2 %
Hematocrit: 36.7 % (ref 34.0–46.6)
Hemoglobin: 12.1 g/dL (ref 11.1–15.9)
Immature Grans (Abs): 0 10*3/uL (ref 0.0–0.1)
Immature Granulocytes: 0 %
Lymphocytes Absolute: 1.8 10*3/uL (ref 0.7–3.1)
Lymphs: 42 %
MCH: 29.5 pg (ref 26.6–33.0)
MCHC: 33 g/dL (ref 31.5–35.7)
MCV: 90 fL (ref 79–97)
Monocytes Absolute: 0.4 10*3/uL (ref 0.1–0.9)
Monocytes: 10 %
Neutrophils Absolute: 2 10*3/uL (ref 1.4–7.0)
Neutrophils: 45 %
Platelets: 243 10*3/uL (ref 150–450)
RBC: 4.1 x10E6/uL (ref 3.77–5.28)
RDW: 13.5 % (ref 11.7–15.4)
WBC: 4.3 10*3/uL (ref 3.4–10.8)

## 2022-08-09 LAB — LIPID PANEL
Chol/HDL Ratio: 2.4 ratio (ref 0.0–4.4)
Cholesterol, Total: 116 mg/dL (ref 100–199)
HDL: 49 mg/dL (ref >39)
LDL Chol Calc (NIH): 55 mg/dL (ref 0–99)
Triglycerides: 55 mg/dL (ref 0–149)
VLDL Cholesterol Cal: 12 mg/dL (ref 5–40)

## 2022-08-09 LAB — HEPATITIS C ANTIBODY: Hep C Virus Ab: NONREACTIVE

## 2022-08-09 LAB — COMPREHENSIVE METABOLIC PANEL
ALT: 14 IU/L (ref 0–32)
AST: 16 IU/L (ref 0–40)
Albumin/Globulin Ratio: 1.7 (ref 1.2–2.2)
Albumin: 4 g/dL (ref 3.9–4.9)
Alkaline Phosphatase: 85 IU/L (ref 44–121)
BUN/Creatinine Ratio: 12 (ref 12–28)
BUN: 10 mg/dL (ref 8–27)
Bilirubin Total: 0.4 mg/dL (ref 0.0–1.2)
CO2: 23 mmol/L (ref 20–29)
Calcium: 9.3 mg/dL (ref 8.7–10.3)
Chloride: 105 mmol/L (ref 96–106)
Creatinine, Ser: 0.82 mg/dL (ref 0.57–1.00)
Globulin, Total: 2.4 g/dL (ref 1.5–4.5)
Glucose: 87 mg/dL (ref 70–99)
Potassium: 3.9 mmol/L (ref 3.5–5.2)
Sodium: 141 mmol/L (ref 134–144)
Total Protein: 6.4 g/dL (ref 6.0–8.5)
eGFR: 77 mL/min/{1.73_m2} (ref >59)

## 2022-08-09 LAB — HEMOGLOBIN A1C
Est. average glucose Bld gHb Est-mCnc: 123 mg/dL
Hgb A1c MFr Bld: 5.9 % — ABNORMAL HIGH (ref 4.8–5.6)

## 2022-08-09 LAB — TSH: TSH: 2 u[IU]/mL (ref 0.450–4.500)

## 2022-08-09 NOTE — Progress Notes (Signed)
Hi [name]. It was nice to see you yesterday.  Your lab work looks good.  Your A1c is well controlled at 5.9.  No concerns at this time. Continue with your current medication regimen.  Follow up as discussed.  Please let me know if you have any questions.

## 2022-09-30 ENCOUNTER — Telehealth: Payer: Self-pay | Admitting: Cardiology

## 2022-09-30 NOTE — Telephone Encounter (Signed)
Patient called stating her previous health care facility- [facility] [place], [place], [place] notified her that they will need a request from the cardiologist, on letterhead to release the patient's previous medical record.  Patient stated the request should specify the patient's Name, date of birth and specific records requested.  Patient stated the request should be faxed to fax# [phone], Attn:  Dr. Daisey Must, MD / Myra Rude, NP.  Patient noted her appointment is scheduled on 1/3.

## 2022-09-30 NOTE — Telephone Encounter (Signed)
The patient has been made aware that the records are scanned into her chart (under Media).

## 2022-10-05 ENCOUNTER — Ambulatory Visit: Payer: 59 | Attending: Cardiology | Admitting: Cardiology

## 2022-10-05 ENCOUNTER — Encounter: Payer: Self-pay | Admitting: Cardiology

## 2022-10-05 VITALS — BP 98/68 | HR 48 | Ht 64.0 in | Wt 206.0 lb

## 2022-10-05 DIAGNOSIS — I771 Stricture of artery: Secondary | ICD-10-CM

## 2022-10-05 DIAGNOSIS — I10 Essential (primary) hypertension: Secondary | ICD-10-CM

## 2022-10-05 DIAGNOSIS — I719 Aortic aneurysm of unspecified site, without rupture: Secondary | ICD-10-CM

## 2022-10-05 DIAGNOSIS — I700 Atherosclerosis of aorta: Secondary | ICD-10-CM

## 2022-10-05 MED ORDER — CARVEDILOL 12.5 MG PO TABS: 12.5000 mg | ORAL_TABLET | Freq: Two times a day (BID) | ORAL | 2 refills | Status: DC

## 2022-10-05 NOTE — Progress Notes (Signed)
Cardiology Office Note:    Date:  10/05/2022   ID:  Amanda Ochoa, DOB Jul 26, 1951, MRN [id number]  PCP:  Jon Billings, NP   [place] [facility] Cardiologist:  Kate Sable, MD     Referring MD: Jon Billings, NP   Chief Complaint  Patient presents with   New Patient (Initial Visit)    Hx of AAA, HTN, no family Hx, sleep apnea     History of Present Illness:    Amanda Ochoa is a 72 y.o. 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  Had symptoms of dizziness with bending over back in 2020.  Evaluated in [place], found to have penetrating thoracic aortic ulcer, underwent endograft placement leading to stenosis of left subclavian artery.  Stent was placed today subclavian artery.  Placed on antiplatelets, has been compliant with medications as prescribed.  Blood pressures sometimes run low in the morning, heart rates usually in the 60s.  She denies chest pain or shortness of breath.  Past Medical History:  Diagnosis Date   Abdominal aneurysm (HCC)    Diverticulitis    GERD (gastroesophageal reflux disease)    Hypertension    Sciatica    Sleep apnea     Past Surgical History:  Procedure Laterality Date   ABDOMINAL HYSTERECTOMY     COLONOSCOPY WITH PROPOFOL N/A 10/19/2021   Procedure: COLONOSCOPY WITH PROPOFOL;  Surgeon: Lucilla Lame, MD;  Location: [facility] ENDOSCOPY;  Service: Endoscopy;  Laterality: N/A;   CORONARY ANGIOPLASTY WITH STENT PLACEMENT     TOTAL ABDOMINAL HYSTERECTOMY W/ BILATERAL SALPINGOOPHORECTOMY      Current Medications: Current Meds  Medication Sig   amLODipine (NORVASC) 10 MG tablet Take 1 tablet (10 mg total) by mouth daily.   Apoaequorin (PREVAGEN PO) Take by mouth. Unable to verify  dosage   clopidogrel (PLAVIX) 75 MG tablet Take 1 tablet (75 mg total) by mouth daily.   losartan (COZAAR) 50 MG tablet Take 1 tablet (50 mg total) by mouth daily.   Multiple Minerals-Vitamins (CALCIUM-MAGNESIUM-ZINC-D3) TABS Take 1 tablet by mouth daily.   Multiple Vitamins-Minerals (WOMENS MULTI GUMMIES PO) Take by mouth.   rosuvastatin (CRESTOR) 5 MG tablet Take 1 tablet (5 mg total) by mouth daily.   [DISCONTINUED] carvedilol (COREG) 25 MG tablet Take 1 tablet (25 mg total) by mouth 2 (two) times daily with a meal.     Allergies:   Penicillins   Social History   Socioeconomic History   Marital status: Single    Spouse name: Not on file   Number of children: Not on file   Years of education: Not on file   Highest education level: Not on file  Occupational History   Not on file  Tobacco Use   Smoking status: Never    Passive exposure: Never   Smokeless tobacco: Never  Vaping Use   Vaping Use: Never used  Substance and Sexual Activity   Alcohol use: Never   Drug use: Never   Sexual activity: Not Currently  Other Topics Concern   Not on file  Social History Narrative   Not on file   Social Determinants of Health   Financial Resource Strain: Low Risk  (03/15/2022)   Overall Financial Resource Strain (CARDIA)    Difficulty of Paying Living Expenses: Not hard  at all  Food Insecurity: No Food Insecurity (03/15/2022)   Hunger Vital Sign    Worried About Running Out of Food in the Last Year: Never true    Ran Out of Food in the Last Year: Never true  Transportation Needs: No Transportation Needs (03/15/2022)   PRAPARE - Hydrologist (Medical): No    Lack of Transportation (Non-Medical): No  Physical Activity: Insufficiently Active (03/15/2022)   Exercise Vital Sign    Days of Exercise per Week: 3 days    Minutes of Exercise per Session: 20 min  Stress: No Stress Concern Present (03/15/2022)   [place]    Feeling of Stress : Not at all  Social Connections: Moderately Isolated (03/15/2022)   Social Connection and Isolation Panel [NHANES]    Frequency of Communication with Friends and Family: More than three times a week    Frequency of Social Gatherings with Friends and Family: Twice a week    Attends Religious Services: Never    Marine scientist or Organizations: Yes    Attends Music therapist: 1 to 4 times per year    Marital Status: Separated     Family History: The patient's family history includes Diabetes in her maternal grandmother and mother; Healthy in her brother, daughter, maternal grandfather, and son; Heart disease in her maternal grandmother. There is no history of Breast cancer.  ROS:   Please see the history of present illness.     All other systems reviewed and are negative.  EKGs/Labs/Other Studies Reviewed:    The following studies were reviewed today:   EKG:  EKG is  ordered today.  The ekg ordered today demonstrates sinus bradycardia, heart rate 48  Recent Labs: 08/08/2022: ALT 14; BUN 10; Creatinine, Ser 0.82; Hemoglobin 12.1; Platelets 243; Potassium 3.9; Sodium 141; TSH 2.000  Recent Lipid Panel    Component Value Date/Time   CHOL 116 08/08/2022 0945   TRIG 55 08/08/2022 0945   HDL 49 08/08/2022 0945   CHOLHDL 2.4 08/08/2022 0945   LDLCALC 55 08/08/2022 0945     Risk Assessment/Calculations:             Physical Exam:    VS:  BP 98/68 (BP Location: Right Arm)   Pulse (!) 48   Ht '5\' 4"'$  (1.626 m)   Wt 206 lb (93.4 kg)   SpO2 98%   BMI 35.36 kg/m     Wt Readings from Last 3 Encounters:  10/05/22 206 lb (93.4 kg)  08/08/22 205 lb 9.6 oz (93.3 kg)  02/17/22 205 lb 3.2 oz (93.1 kg)     GEN:  Well nourished, well developed in no acute distress HEENT: Normal NECK: No JVD; No carotid bruits CARDIAC: RRR, no murmurs, rubs, gallops RESPIRATORY:  Clear to auscultation without rales,  wheezing or rhonchi  ABDOMEN: Soft, non-tender, non-distended MUSCULOSKELETAL:  No edema; No deformity  SKIN: Warm and dry NEUROLOGIC:  Alert and oriented x 3 PSYCHIATRIC:  Normal affect   ASSESSMENT:    1. Aortic atherosclerosis ([place])   2. Primary hypertension   3. Penetrating atherosclerotic ulcer of aorta (HCC)   4. Subclavian arterial stenosis (HCC)    PLAN:    In order of problems listed above:  Aortic atherosclerosis, obtain echo to evaluate any cardiac dysfunction. Hypertension, BP low normal, bradycardic with heart rates 48.  Reduce Coreg to 12.5 mg twice daily, continue Norvasc, losartan. Penetrating ulcer  s/p aortic arch-proximal descending aorta graft placement.  Continue Plavix, statin.  Referred to vascular surgery for continued monitoring. PAD/subclavian artery stenosis s/p stent placement.  Vascular surgery as above.  Continue Plavix, statin.  LDL at goal.  Follow-up after echo.    Medication Adjustments/Labs and Tests Ordered: Current medicines are reviewed at length with the patient today.  Concerns regarding medicines are outlined above.  Orders Placed This Encounter  Procedures   Ambulatory referral to Vascular Surgery   EKG 12-Lead   ECHOCARDIOGRAM COMPLETE   Meds ordered this encounter  Medications   carvedilol (COREG) 12.5 MG tablet    Sig: Take 1 tablet (12.5 mg total) by mouth 2 (two) times daily with a meal.    Dispense:  60 tablet    Refill:  2    Patient Instructions  Medication Instructions:   Decrease carvedilol - Take one tablet (12.'5mg'$ ) by mouth twice a day.   *If you need a refill on your cardiac medications before your next appointment, please call your pharmacy*   Lab Work:  None Ordered  If you have labs (blood work) drawn today and your tests are completely normal, you will receive your results only by: [place] (if you have MyChart) OR A paper copy in the mail If you have any lab test that is abnormal or we need to  change your treatment, we will call you to review the results.   Testing/Procedures:  Echocardiogram   Your physician has requested that you have an echocardiogram. Echocardiography is a painless test that uses sound waves to create images of your heart. It provides your doctor with information about the size and shape of your heart and how well your heart's chambers and valves are working. This procedure takes approximately one hour. There are no restrictions for this procedure. Please note; depending on visual quality an IV may need to be placed.     Follow-Up: At [facility], you and your health needs are our priority.  As part of our continuing mission to provide you with exceptional heart care, we have created designated Provider Care Teams.  These Care Teams include your primary Cardiologist (physician) and Advanced Practice Providers (APPs -  Physician Assistants and Nurse Practitioners) who all work together to provide you with the care you need, when you need it.  We recommend signing up for the patient portal called "MyChart".  Sign up information is provided on this After Visit Summary.  MyChart is used to connect with patients for Virtual Visits (Telemedicine).  Patients are able to view lab/test results, encounter notes, upcoming appointments, etc.  Non-urgent messages can be sent to your provider as well.   To learn more about what you can do with MyChart, go to [email or web address].    Your next appointment:  After Echocardiogram  The format for your next appointment:   In Person  Provider:   You may see Kate Sable, MD or one of the following Advanced Practice Providers on your designated Care Team:   Murray Hodgkins, NP Christell Faith, PA-C Cadence Kathlen Mody, PA-C Gerrie Nordmann, NP       Signed, Kate Sable, MD  10/05/2022 1:10 PM    [place]

## 2022-10-05 NOTE — Patient Instructions (Signed)
Medication Instructions:   Decrease carvedilol - Take one tablet (12.'5mg'$ ) by mouth twice a day.   *If you need a refill on your cardiac medications before your next appointment, please call your pharmacy*   Lab Work:  None Ordered  If you have labs (blood work) drawn today and your tests are completely normal, you will receive your results only by: [place] (if you have MyChart) OR A paper copy in the mail If you have any lab test that is abnormal or we need to change your treatment, we will call you to review the results.   Testing/Procedures:  Echocardiogram   Your physician has requested that you have an echocardiogram. Echocardiography is a painless test that uses sound waves to create images of your heart. It provides your doctor with information about the size and shape of your heart and how well your heart's chambers and valves are working. This procedure takes approximately one hour. There are no restrictions for this procedure. Please note; depending on visual quality an IV may need to be placed.     Follow-Up: At [facility], you and your health needs are our priority.  As part of our continuing mission to provide you with exceptional heart care, we have created designated Provider Care Teams.  These Care Teams include your primary Cardiologist (physician) and Advanced Practice Providers (APPs -  Physician Assistants and Nurse Practitioners) who all work together to provide you with the care you need, when you need it.  We recommend signing up for the patient portal called "MyChart".  Sign up information is provided on this After Visit Summary.  MyChart is used to connect with patients for Virtual Visits (Telemedicine).  Patients are able to view lab/test results, encounter notes, upcoming appointments, etc.  Non-urgent messages can be sent to your provider as well.   To learn more about what you can do with MyChart, go to [email or web address].    Your  next appointment:  After Echocardiogram  The format for your next appointment:   In Person  Provider:   You may see Kate Sable, MD or one of the following Advanced Practice Providers on your designated Care Team:   Murray Hodgkins, NP Christell Faith, PA-C Cadence Kathlen Mody, PA-C Gerrie Nordmann, NP

## 2022-10-06 NOTE — Addendum Note (Signed)
Addended by: Janan Ridge on: 10/06/2022 07:55 AM   Modules accepted: Orders

## 2022-10-10 ENCOUNTER — Telehealth: Payer: Self-pay | Admitting: Nurse Practitioner

## 2022-10-10 ENCOUNTER — Telehealth: Payer: Self-pay

## 2022-10-10 ENCOUNTER — Other Ambulatory Visit: Payer: Self-pay | Admitting: Nurse Practitioner

## 2022-10-10 DIAGNOSIS — Z1211 Encounter for screening for malignant neoplasm of colon: Secondary | ICD-10-CM

## 2022-10-10 DIAGNOSIS — Z1231 Encounter for screening mammogram for malignant neoplasm of breast: Secondary | ICD-10-CM

## 2022-10-10 NOTE — Telephone Encounter (Signed)
Copied from [place] [phone]. Topic: Referral - Request for Referral >> Oct 10, 2022  1:14 PM Oley Balm A wrote: Patient is requesting for Amanda Ochoa to set up her referral for her annual mammogram  .  Please advise

## 2022-10-10 NOTE — Telephone Encounter (Signed)
Patient notified that referral was placed

## 2022-10-10 NOTE — Telephone Encounter (Signed)
Called and spoke with patient. Requesting to have mammogram scheduled mid morning.    Called [facility] and scheduled the patient's mammogram for 11/25/22 at 10:40 AM.    Called patient back and notified her of the appointment date and time.

## 2022-10-10 NOTE — Telephone Encounter (Signed)
Called and spoke to patient. She states that she received a call from Dr. Maisie Fus office and has an appointment with them already. Nothing further needed from [place].

## 2022-10-10 NOTE — Telephone Encounter (Signed)
Copied from [place] [id number]. Topic: Referral - Request for Referral >> Oct 10, 2022  1:16 PM Amanda Ochoa wrote: Patient is wanting her PCP to set up [facility] referral for her colonoscopy with Dr. Lucilla Lame. Dr. Allen Norris phone number: [phone]

## 2022-10-10 NOTE — Telephone Encounter (Signed)
Copied from [place] [phone]. Topic: Referral - Request for Referral >> Oct 10, 2022  1:18 PM Oley Balm A wrote: Patient is requesting her PCP to set up an appointment with Dr. Kathyrn Sheriff for her sleep apena Per patient she is required to see him once or twice a year so that she can get her supplies for her c pap machine.   Dr.Juengel phone number: [phone]

## 2022-10-10 NOTE — Telephone Encounter (Signed)
Called and spoke to patient. According to chart, patient is not due for another colonoscopy until 2030. Last one was January 2023 and advised to repeat colonoscopy in 7 years. Explained this to patient. Patient states that she does not want to wait that long as she has had issues with her gastro system. Advised patient that we could put in a referral and that Dr. Dorothey Baseman office would reach out to her to get her scheduled. Patient verbalized understanding of this.

## 2022-10-10 NOTE — Telephone Encounter (Signed)
Pt is calling requesting that she have another colonoscopy and would like to have them annually part of her annual preventative measures  I went over pt's previous report and letter results... Pt states she was seen in ED in 2008 for something GI related as well as in 2015 while in [place]  She states she does not want to wait until she has issues to schedule an appt and have a colonoscopy...  Pt denies any current GI Sx, no family Hx of colon polyps or cancer  Pt wanted to see if you would be willing to do a colonoscopy at least every 1-2 years for preventative....  I explained to the pt that there are guidelines that are follow based upon procedure and pathology results...  Please advise

## 2022-10-11 NOTE — Telephone Encounter (Signed)
Pt is aware as instructed and expressed understanding 

## 2022-10-11 NOTE — Telephone Encounter (Signed)
Amanda Lame, MD    Please let the patient know that doing colonoscopies every 1 to 2 years would be considered malpractice since polyps take many years to grow and there are no guidelines for colonoscopies every 1 to 2 years.  In general if there are no polyps and a colonoscopy it is recommended to have a colonoscopy in 10 years as long as there is no family history but with a history of colon polyps that are precancerous recommendations are 1-2 polyps repeat in 7 years 3-5 polyps in 5 years 5-10 polyps in 3 years.  The insurance companies are unlikely to pay for colonoscopies every year and it is exposing her to invasive procedures without any proven benefit.

## 2022-10-13 ENCOUNTER — Ambulatory Visit: Payer: 59 | Attending: Cardiology

## 2022-10-13 DIAGNOSIS — I700 Atherosclerosis of aorta: Secondary | ICD-10-CM

## 2022-10-13 LAB — ECHOCARDIOGRAM COMPLETE
AR max vel: 4.47 cm2
AV Area VTI: 4.35 cm2
AV Area mean vel: 4.08 cm2
AV Mean grad: 2.5 mmHg
AV Peak grad: 4.1 mmHg
Ao pk vel: 1.01 m/s
Area-P 1/2: 4.89 cm2
Calc EF: 60.9 %
P 1/2 time: 688 msec
S' Lateral: 3.55 cm
Single Plane A2C EF: 57.5 %
Single Plane A4C EF: 57.3 %

## 2022-10-21 ENCOUNTER — Other Ambulatory Visit: Payer: Self-pay | Admitting: Nurse Practitioner

## 2022-10-21 NOTE — Telephone Encounter (Signed)
Copied from [place] [phone]. Topic: General - Other >> Oct 21, 2022 10:42 AM [place] C wrote: Reason for CRM: Medication Refill - Medication: amLODipine (NORVASC) 10 MG tablet [[id number]]  losartan (COZAAR) 50 MG tablet [[id number]]  Has the patient contacted their pharmacy? Yes.   (Agent: If no, request that the patient contact the pharmacy for the refill. If patient does not wish to contact the pharmacy document the reason why and proceed with request.) (Agent: If yes, when and what did the pharmacy advise?)  Preferred Pharmacy (with phone number or street name): [facility] [place] ([place]) [place]: [phone] Fax: [phone]: Not open 24 hours   Has the patient been seen for an appointment in the last year OR does the patient have an upcoming appointment? Yes.    Agent: Please be advised that RX refills may take up to 3 business days. We ask that you follow-up with your pharmacy.

## 2022-10-24 MED ORDER — AMLODIPINE BESYLATE 10 MG PO TABS: 10.0000 mg | ORAL_TABLET | Freq: Every day | ORAL | 1 refills | Status: DC

## 2022-10-24 MED ORDER — LOSARTAN POTASSIUM 50 MG PO TABS: 50.0000 mg | ORAL_TABLET | Freq: Every day | ORAL | 1 refills | Status: DC

## 2022-11-04 ENCOUNTER — Ambulatory Visit: Payer: 59 | Admitting: Cardiology

## 2022-11-08 ENCOUNTER — Ambulatory Visit: Payer: 59 | Attending: Cardiology | Admitting: Cardiology

## 2022-11-08 ENCOUNTER — Encounter: Payer: Self-pay | Admitting: Cardiology

## 2022-11-08 VITALS — BP 102/68 | HR 52 | Ht 64.0 in | Wt 207.2 lb

## 2022-11-08 DIAGNOSIS — I10 Essential (primary) hypertension: Secondary | ICD-10-CM

## 2022-11-08 DIAGNOSIS — I719 Aortic aneurysm of unspecified site, without rupture: Secondary | ICD-10-CM

## 2022-11-08 DIAGNOSIS — I77810 Thoracic aortic ectasia: Secondary | ICD-10-CM | POA: Diagnosis not present

## 2022-11-08 NOTE — Progress Notes (Signed)
Cardiology Office Note:    Date:  11/08/2022   ID:  Amanda Ochoa, DOB 1951/09/16, MRN [id number]  PCP:  Jon Billings, NP   [facility] [place] [facility] Cardiologist:  Kate Sable, MD     Referring MD: Jon Billings, NP   Chief Complaint  Patient presents with   Follow-up    Testing f/u, no new cardiac concerns     History of Present Illness:    Amanda Ochoa is a 72 y.o. 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  Previously seen to establish care.  Echo obtained to evaluate cardiac function.  Carvedilol previously reduced due to bradycardia and low normal BP.  Compliant with medications as prescribed, no new concerns at this time.  Presents for testing/echo results.  Blood pressure systolic around 188C, heart rates in the high 50s to low 60s at home.   Past Medical History:  Diagnosis Date   Abdominal aneurysm (HCC)    Diverticulitis    GERD (gastroesophageal reflux disease)    Hypertension    Sciatica    Sleep apnea     Past Surgical History:  Procedure Laterality Date   ABDOMINAL HYSTERECTOMY     COLONOSCOPY WITH PROPOFOL N/A 10/19/2021   Procedure: COLONOSCOPY WITH PROPOFOL;  Surgeon: Lucilla Lame, MD;  Location: [facility] ENDOSCOPY;  Service: Endoscopy;  Laterality: N/A;   CORONARY ANGIOPLASTY WITH STENT PLACEMENT     TOTAL ABDOMINAL HYSTERECTOMY W/ BILATERAL SALPINGOOPHORECTOMY      Current Medications: Current Meds  Medication Sig   alendronate (FOSAMAX) 70 MG tablet Take 1 tablet (70 mg total) by mouth every 7 (seven) days. Take with a full glass of water on an empty stomach.   amLODipine (NORVASC) 10 MG tablet Take 1 tablet (10 mg total) by mouth daily.   Apoaequorin (PREVAGEN PO) Take by mouth. Unable to  verify dosage   carvedilol (COREG) 12.5 MG tablet Take 1 tablet (12.5 mg total) by mouth 2 (two) times daily with a meal.   clopidogrel (PLAVIX) 75 MG tablet Take 1 tablet (75 mg total) by mouth daily.   losartan (COZAAR) 50 MG tablet Take 1 tablet (50 mg total) by mouth daily.   Multiple Minerals-Vitamins (CALCIUM-MAGNESIUM-ZINC-D3) TABS Take 1 tablet by mouth daily.   Multiple Vitamins-Minerals (WOMENS MULTI GUMMIES PO) Take by mouth.   rosuvastatin (CRESTOR) 5 MG tablet Take 1 tablet (5 mg total) by mouth daily.     Allergies:   Penicillins   Social History   Socioeconomic History   Marital status: Single    Spouse name: Not on file   Number of children: Not on file   Years of education: Not on file   Highest education level: Not on file  Occupational History   Not on file  Tobacco Use   Smoking status: Never    Passive exposure: Never   Smokeless tobacco: Never  Vaping Use   Vaping Use: Never used  Substance and Sexual Activity   Alcohol use: Never   Drug use: Never   Sexual activity: Not Currently  Other Topics Concern   Not on file  Social History Narrative   Not on file   Social Determinants of Health   Financial Resource Strain: Low Risk  (03/15/2022)   Overall Financial Resource Strain (CARDIA)    Difficulty  of Paying Living Expenses: Not hard at all  Food Insecurity: No Food Insecurity (03/15/2022)   Hunger Vital Sign    Worried About Running Out of Food in the Last Year: Never true    Ran Out of Food in the Last Year: Never true  Transportation Needs: No Transportation Needs (03/15/2022)   PRAPARE - Hydrologist (Medical): No    Lack of Transportation (Non-Medical): No  Physical Activity: Insufficiently Active (03/15/2022)   Exercise Vital Sign    Days of Exercise per Week: 3 days    Minutes of Exercise per Session: 20 min  Stress: No Stress Concern Present (03/15/2022)   [place]    Feeling of Stress : Not at all  Social Connections: Moderately Isolated (03/15/2022)   Social Connection and Isolation Panel [NHANES]    Frequency of Communication with Friends and Family: More than three times a week    Frequency of Social Gatherings with Friends and Family: Twice a week    Attends Religious Services: Never    Marine scientist or Organizations: Yes    Attends Music therapist: 1 to 4 times per year    Marital Status: Separated     Family History: The patient's family history includes Diabetes in her maternal grandmother and mother; Healthy in her brother, daughter, maternal grandfather, and son; Heart disease in her maternal grandmother. There is no history of Breast cancer.  ROS:   Please see the history of present illness.     All other systems reviewed and are negative.  EKGs/Labs/Other Studies Reviewed:    The following studies were reviewed today:   EKG:  EKG not ordered today.    Recent Labs: 08/08/2022: ALT 14; BUN 10; Creatinine, Ser 0.82; Hemoglobin 12.1; Platelets 243; Potassium 3.9; Sodium 141; TSH 2.000  Recent Lipid Panel    Component Value Date/Time   CHOL 116 08/08/2022 0945   TRIG 55 08/08/2022 0945   HDL 49 08/08/2022 0945   CHOLHDL 2.4 08/08/2022 0945   LDLCALC 55 08/08/2022 0945     Risk Assessment/Calculations:             Physical Exam:    VS:  BP 102/68 (BP Location: Left Arm, Patient Position: Sitting, Cuff Size: Large)   Pulse (!) 52   Ht '5\' 4"'$  (1.626 m)   Wt 207 lb 3.2 oz (94 kg)   SpO2 98%   BMI 35.57 kg/m     Wt Readings from Last 3 Encounters:  11/08/22 207 lb 3.2 oz (94 kg)  10/05/22 206 lb (93.4 kg)  08/08/22 205 lb 9.6 oz (93.3 kg)     GEN:  Well nourished, well developed in no acute distress HEENT: Normal NECK: No JVD; No carotid bruits CARDIAC: RRR, no murmurs, rubs, gallops RESPIRATORY:  Clear to auscultation without rales, wheezing or rhonchi  ABDOMEN: Soft,  non-tender, non-distended MUSCULOSKELETAL:  No edema; No deformity  SKIN: Warm and dry NEUROLOGIC:  Alert and oriented x 3 PSYCHIATRIC:  Normal affect   ASSESSMENT:    1. Aortic root dilation (HCC)   2. Primary hypertension   3. Penetrating atherosclerotic ulcer of aorta (HCC)    PLAN:    In order of problems listed above:  Mild to moderate aortic root dilatation 42 to 45 mm.  Normal EF 50%.  Continue BP meds.  Plan serial monitoring. Hypertension, heart rate slightly improved from prior, currently 52.  BP  low normal.  Continue Coreg 12.5 mg twice daily, Norvasc, losartan. Penetrating ulcer s/p aortic arch-proximal descending aorta graft placement (2020).  Obtain CT chest abdomen aorta.  Continue Plavix, statin.  Monitoring and management as per vascular surgery.  Refer to vascular surgery.  Follow-up in 6 months.    Medication Adjustments/Labs and Tests Ordered: Current medicines are reviewed at length with the patient today.  Concerns regarding medicines are outlined above.  Orders Placed This Encounter  Procedures   CT ANGIO CHEST AORTA W/CM & OR WO/CM   CT ANGIO ABDOMEN W &/OR WO CONTRAST   Ambulatory referral to Vascular Surgery   No orders of the defined types were placed in this encounter.   Patient Instructions  Medication Instructions:   Your physician recommends that you continue on your current medications as directed. Please refer to the Current Medication list given to you today.  *If you need a refill on your cardiac medications before your next appointment, please call your pharmacy*   Lab Work:  None Ordered  If you have labs (blood work) drawn today and your tests are completely normal, you will receive your results only by: [place] (if you have MyChart) OR A paper copy in the mail If you have any lab test that is abnormal or we need to change your treatment, we will call you to review the results.   Testing/Procedures:  I have scheduled  you for a chest and abdomen ct to look at your aorta on 11/17/2022 @ 11am.  Please drink plenty of Water. Nothing to eat 4 hours prior to the test.   [place],  [id number]    Follow-Up: At [facility], you and your health needs are our priority.  As part of our continuing mission to provide you with exceptional heart care, we have created designated Provider Care Teams.  These Care Teams include your primary Cardiologist (physician) and Advanced Practice Providers (APPs -  Physician Assistants and Nurse Practitioners) who all work together to provide you with the care you need, when you need it.  We recommend signing up for the patient portal called "MyChart".  Sign up information is provided on this After Visit Summary.  MyChart is used to connect with patients for Virtual Visits (Telemedicine).  Patients are able to view lab/test results, encounter notes, upcoming appointments, etc.  Non-urgent messages can be sent to your provider as well.   To learn more about what you can do with MyChart, go to [email or web address].    Your next appointment:   6 month(s)  Provider:   You may see Kate Sable, MD or one of the following Advanced Practice Providers on your designated Care Team:   Murray Hodgkins, NP Christell Faith, PA-C Cadence Kathlen Mody, PA-C Gerrie Nordmann, NP   Signed, Kate Sable, MD  11/08/2022 11:07 AM    [place]

## 2022-11-08 NOTE — Patient Instructions (Addendum)
Medication Instructions:   Your physician recommends that you continue on your current medications as directed. Please refer to the Current Medication list given to you today.  *If you need a refill on your cardiac medications before your next appointment, please call your pharmacy*   Lab Work:  None Ordered  If you have labs (blood work) drawn today and your tests are completely normal, you will receive your results only by: [place] (if you have MyChart) OR A paper copy in the mail If you have any lab test that is abnormal or we need to change your treatment, we will call you to review the results.   Testing/Procedures:  I have scheduled you for a chest and abdomen ct to look at your aorta on 11/17/2022 @ 11am.  Please drink plenty of Water. Nothing to eat 4 hours prior to the test.   [place], [place] [phone]    Follow-Up: At [facility], you and your health needs are our priority.  As part of our continuing mission to provide you with exceptional heart care, we have created designated Provider Care Teams.  These Care Teams include your primary Cardiologist (physician) and Advanced Practice Providers (APPs -  Physician Assistants and Nurse Practitioners) who all work together to provide you with the care you need, when you need it.  We recommend signing up for the patient portal called "MyChart".  Sign up information is provided on this After Visit Summary.  MyChart is used to connect with patients for Virtual Visits (Telemedicine).  Patients are able to view lab/test results, encounter notes, upcoming appointments, etc.  Non-urgent messages can be sent to your provider as well.   To learn more about what you can do with MyChart, go to [email or web address].    Your next appointment:   6 month(s)  Provider:   You may see Kate Sable, MD or one of the following Advanced Practice Providers on your  designated Care Team:   Murray Hodgkins, NP Christell Faith, PA-C Cadence Kathlen Mody, PA-C Gerrie Nordmann, NP

## 2022-11-17 ENCOUNTER — Ambulatory Visit
Admission: RE | Admit: 2022-11-17 | Discharge: 2022-11-17 | Disposition: A | Discharge status: Home / Self Care | Payer: 59 | Source: Ambulatory Visit | Attending: Cardiology | Admitting: Cardiology

## 2022-11-17 ENCOUNTER — Other Ambulatory Visit: Payer: Self-pay | Admitting: Cardiology

## 2022-11-17 DIAGNOSIS — I10 Essential (primary) hypertension: Secondary | ICD-10-CM

## 2022-11-17 DIAGNOSIS — I77810 Thoracic aortic ectasia: Secondary | ICD-10-CM

## 2022-11-17 DIAGNOSIS — I719 Aortic aneurysm of unspecified site, without rupture: Secondary | ICD-10-CM

## 2022-11-17 MED ORDER — IOHEXOL 350 MG/ML SOLN
100.0000 mL | Freq: Once | INTRAVENOUS | Status: AC | PRN
  Administered 2022-11-17: 100 mL via INTRAVENOUS

## 2022-11-18 ENCOUNTER — Telehealth: Payer: Self-pay | Admitting: Cardiology

## 2022-11-18 NOTE — Telephone Encounter (Signed)
Spoke to patient and informed her that the referral has been received by vein and vascular and that they will be contacting her to get scheduled. Patient was satisfied with response.

## 2022-11-18 NOTE — Telephone Encounter (Signed)
Patient called to report she was referred to [facility] and no one has reached out to her yet.  Patient would like a call back to discuss this.

## 2022-11-25 ENCOUNTER — Ambulatory Visit
Admission: RE | Admit: 2022-11-25 | Discharge: 2022-11-25 | Disposition: A | Discharge status: Home / Self Care | Payer: 59 | Source: Ambulatory Visit | Attending: Nurse Practitioner | Admitting: Nurse Practitioner

## 2022-11-25 DIAGNOSIS — Z1231 Encounter for screening mammogram for malignant neoplasm of breast: Secondary | ICD-10-CM | POA: Insufficient documentation

## 2022-11-29 NOTE — Progress Notes (Signed)
Please let patient know her Mammogram did not show any evidence of a malignancy.  The recommendation is to repeat the Mammogram in 1 year.  

## 2022-12-06 DIAGNOSIS — I714 Abdominal aortic aneurysm, without rupture, unspecified: Secondary | ICD-10-CM | POA: Insufficient documentation

## 2022-12-06 DIAGNOSIS — I700 Atherosclerosis of aorta: Secondary | ICD-10-CM | POA: Insufficient documentation

## 2022-12-06 NOTE — Progress Notes (Unsigned)
MRN : [id number]  Amanda Ochoa is a 72 y.o. (1950/12/14) female who presents with chief complaint of check circulation.  History of Present Illness:  The patient returns to the office for surveillance of a known abdominal aortic aneurysm.  In 06/2021 the abdominal aortic measurement of 2.87 cm.  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 No changes suggesting embolic episodes.   There have been no interval changes in the patient's overall health care since his last visit.  Patient denies amaurosis fugax or TIA symptoms. There is no history of claudication or rest pain symptoms of the lower extremities. The patient denies angina or shortness of breath.   Duplex US of the aorta and iliac arteries shows an AAA measured *** cm.   No outpatient medications have been marked as taking for the 12/08/22 encounter (Appointment) with Delana Meyer, Dolores Lory, MD.    Past Medical History:  Diagnosis Date   Abdominal aneurysm ([place])    Diverticulitis    GERD (gastroesophageal reflux disease)    Hypertension    Sciatica    Sleep apnea     Past Surgical History:  Procedure Laterality Date   ABDOMINAL HYSTERECTOMY     COLONOSCOPY WITH PROPOFOL N/A 10/19/2021   Procedure: COLONOSCOPY WITH PROPOFOL;  Surgeon: Lucilla Lame, MD;  Location: [facility] ENDOSCOPY;  Service: Endoscopy;  Laterality: N/A;   CORONARY ANGIOPLASTY WITH STENT PLACEMENT     TOTAL ABDOMINAL HYSTERECTOMY W/ BILATERAL SALPINGOOPHORECTOMY      Social History Social History   Tobacco Use   Smoking status: Never    Passive exposure: Never   Smokeless tobacco: Never  Vaping Use   Vaping Use: Never used  Substance Use Topics   Alcohol use: Never   Drug use: Never    Family  History Family History  Problem Relation Age of Onset   Diabetes Mother    Healthy Daughter    Diabetes Maternal Grandmother    Heart disease Maternal Grandmother    Healthy Maternal Grandfather    Healthy Brother    Healthy Son    Breast cancer Neg Hx     Allergies  Allergen Reactions   Penicillins Itching and Rash     REVIEW OF SYSTEMS (Negative unless checked)  Constitutional: '[]'$ Weight loss  '[]'$ Fever  '[]'$ Chills Cardiac: '[]'$ Chest pain   '[]'$ Chest pressure   '[]'$ Palpitations   '[]'$ Shortness of breath when laying flat   '[]'$ Shortness of breath with exertion. Vascular:  '[x]'$ Pain in legs with walking   '[]'$ Pain in legs at rest  '[]'$ History of DVT   '[]'$ Phlebitis   '[]'$ Swelling in legs   '[]'$ Varicose veins   '[]'$ Non-healing ulcers Pulmonary:   '[]'$ Uses home oxygen   '[]'$ Productive cough   '[]'$ Hemoptysis   '[]'$ Wheeze  '[]'$ COPD   '[]'$ Asthma Neurologic:  '[]'$ Dizziness   '[]'$ Seizures   '[]'$ History of stroke   '[]'$ History of TIA  '[]'$ Aphasia   '[]'$ Vissual changes   '[]'$ Weakness or numbness in arm   '[]'$ Weakness or numbness in leg Musculoskeletal:   '[]'$ Joint swelling   '[]'$ Joint pain   '[]'$ Low back pain Hematologic:  '[]'$ Easy bruising  '[]'$ Easy bleeding   '[]'$ Hypercoagulable state   '[]'$ Anemic Gastrointestinal:  '[]'$ Diarrhea   '[]'$ Vomiting  '[]'$ Gastroesophageal reflux/heartburn   '[]'$ Difficulty swallowing.  Genitourinary:  '[]'$ Chronic kidney disease   '[]'$ Difficult urination  '[]'$ Frequent urination   '[]'$ Blood in urine Skin:  '[]'$ Rashes   '[]'$ Ulcers  Psychological:  '[]'$ History of anxiety   '[]'$  History of major depression.  Physical Examination  There were no vitals filed for this visit. There is no height or weight on file to calculate BMI. Gen: WD/WN, NAD Head: [place]/AT, No temporalis wasting.  Ear/Nose/Throat: Hearing grossly intact, nares w/o erythema or drainage Eyes: PER, EOMI, sclera nonicteric.  Neck: Supple, no masses.  No bruit or JVD.  Pulmonary:  Good air movement, no audible wheezing, no use of accessory muscles.  Cardiac: RRR, normal S1, S2, no  Murmurs. Vascular:  mild trophic changes, no open wounds Vessel Right Left  Radial Palpable Palpable  PT Not Palpable Not Palpable  DP Not Palpable Not Palpable  Gastrointestinal: soft, non-distended. No guarding/no peritoneal signs.  Musculoskeletal: M/S 5/5 throughout.  No visible deformity.  Neurologic: CN 2-12 intact. Pain and light touch intact in extremities.  Symmetrical.  Speech is fluent. Motor exam as listed above. Psychiatric: Judgment intact, Mood & affect appropriate for pt's clinical situation. Dermatologic: No rashes or ulcers noted.  No changes consistent with cellulitis.   CBC Lab Results  Component Value Date   WBC 4.3 08/08/2022   HGB 12.1 08/08/2022   HCT 36.7 08/08/2022   MCV 90 08/08/2022   PLT 243 08/08/2022    BMET    Component Value Date/Time   NA 141 08/08/2022 0945   K 3.9 08/08/2022 0945   CL 105 08/08/2022 0945   CO2 23 08/08/2022 0945   GLUCOSE 87 08/08/2022 0945   BUN 10 08/08/2022 0945   CREATININE 0.82 08/08/2022 0945   CALCIUM 9.3 08/08/2022 0945   CrCl cannot be calculated (Patient's most recent lab result is older than the maximum 21 days allowed.).  COAG No results found for: "INR", "PROTIME"  Radiology MM 3D SCREEN BREAST BILATERAL  Result Date: 11/29/2022 CLINICAL DATA:  Screening. EXAM: DIGITAL SCREENING BILATERAL MAMMOGRAM WITH TOMOSYNTHESIS AND CAD TECHNIQUE: Bilateral screening digital craniocaudal and mediolateral oblique mammograms were obtained. Bilateral screening digital breast tomosynthesis was performed. The images were evaluated with computer-aided detection. COMPARISON:  Previous exam(s). ACR Breast Density Category a: The breasts are almost entirely fatty. FINDINGS: There are no findings suspicious for malignancy. IMPRESSION: No mammographic evidence of malignancy. A result letter of this screening mammogram will be mailed directly to the patient. RECOMMENDATION: Screening mammogram in one year. (Code:[id number]) BI-RADS  CATEGORY  1: Negative. Electronically Signed   By: Ammie Ferrier M.D.   On: 11/29/2022 11:26   CT ANGIO CHEST AORTA W/CM & OR WO/CM  Result Date: 11/17/2022 CLINICAL DATA:  Suspected aortic root dilatation on recent echocardiogram. Previously placed aortic stent for a penetrating aortic ulcer and stent for left subclavian artery stenosis. EXAM: CT ANGIOGRAPHY CHEST, ABDOMEN AND PELVIS TECHNIQUE: Non-contrast CT of the chest was initially obtained. Multidetector CT imaging through the chest, abdomen and pelvis was performed using the standard protocol during bolus administration of intravenous contrast. Multiplanar reconstructed images and MIPs were obtained and reviewed to evaluate the vascular anatomy. RADIATION DOSE REDUCTION: This exam was performed according to the departmental dose-optimization program which includes automated exposure control, adjustment of the mA and/or kV according to patient size and/or use of iterative reconstruction technique. CONTRAST:  127m OMNIPAQUE IOHEXOL 350 MG/ML SOLN COMPARISON:  None Available. FINDINGS: CTA CHEST FINDINGS Cardiovascular: Aortic arch and proximal descending thoracic aorta stent beginning at the level of the left  subclavian artery. Stent in the proximal left subclavian artery including the origin. The aortic root measures 4.2 cm in maximum diameter. The ascending thoracic aorta measures 4.5 cm in maximum diameter. Proximal arch measures 3.8 cm in diameter. The descending thoracic aorta measures 3.4 cm in maximum diameter. No aortic dissection. Enlarged heart with left atrial and left ventricular enlargement. No pericardial effusion. Mediastinum/Nodes: No enlarged mediastinal, hilar, or axillary lymph nodes. Thyroid gland, trachea, and esophagus demonstrate no significant findings. Lungs/Pleura: Minimal bilateral dependent atelectasis and bibasilar linear atelectasis/scarring. Pleural fluid. Musculoskeletal: Thoracic and lower cervical spine degenerative  changes. Review of the MIP images confirms the above findings. CTA ABDOMEN AND PELVIS FINDINGS VASCULAR Aorta: Normal caliber aorta without aneurysm, dissection, vasculitis or significant stenosis. Celiac: Patent without evidence of aneurysm, dissection, vasculitis or significant stenosis. SMA: Patent without evidence of aneurysm, dissection, vasculitis or significant stenosis. Renals: Both renal arteries are patent without evidence of aneurysm, dissection, vasculitis, fibromuscular dysplasia or significant stenosis. IMA: Patent without evidence of aneurysm, dissection, vasculitis or significant stenosis. Inflow: Patent without evidence of aneurysm, dissection, vasculitis or significant stenosis. Veins: No obvious venous abnormality within the limitations of this arterial phase study. Review of the MIP images confirms the above findings. NON-VASCULAR Hepatobiliary: Several small liver cysts. Poorly distended gallbladder. Pancreas: Unremarkable. No pancreatic ductal dilatation or surrounding inflammatory changes. Spleen: Normal in size without focal abnormality. Adrenals/Urinary Tract: Adrenal glands are unremarkable. Small simple left renal cyst. This does not need imaging follow-up. Otherwise, the kidneys are normal, without renal calculi, focal lesion, or hydronephrosis. Bladder is unremarkable. Stomach/Bowel: Small hiatal hernia. Scattered colon diverticula, including the right colon. The appendix is not identified. No evidence of appendicitis. Unremarkable small bowel. Lymphatic: No enlarged lymph nodes. Reproductive: Status post hysterectomy. No adnexal masses. Other: No abdominal wall hernia or abnormality. No abdominopelvic ascites. Musculoskeletal: Lumbar spine degenerative changes, including facet degenerative changes with associated grade 1 anterolisthesis at the L4-5 level. No pars defects or fractures. Review of the MIP images confirms the above findings. IMPRESSION: 1. No aortic dissection. 2. 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 Recommend semi-annual imaging followup by CTA or MRA and referral to cardiothoracic surgery if not already obtained. This recommendation follows 2010 ACCF/AHA/AATS/ACR/ASA/SCA/SCAI/SIR/STS/SVM Guidelines for the Diagnosis and Management of Patients With Thoracic Aortic Disease. Circulation. 2010; [id number]. Aortic aneurysm NOS (ICD10-I71.9) . 3. Distal aortic arch and proximal descending thoracic aorta stent. 4. Cardiomegaly with left atrial and left ventricular enlargement. 5. Colonic diverticulosis. 6. Small hiatal hernia. Electronically Signed   By: Claudie Revering M.D.   On: 11/17/2022 14:30   CT Angio Abd/Pel w/ and/or w/o  Result Date: 11/17/2022 CLINICAL DATA:  Suspected aortic root dilatation on recent echocardiogram. Previously placed aortic stent for a penetrating aortic ulcer and stent for left subclavian artery stenosis. EXAM: CT ANGIOGRAPHY CHEST, ABDOMEN AND PELVIS TECHNIQUE: Non-contrast CT of the chest was initially obtained. Multidetector CT imaging through the chest, abdomen and pelvis was performed using the standard protocol during bolus administration of intravenous contrast. Multiplanar reconstructed images and MIPs were obtained and reviewed to evaluate the vascular anatomy. RADIATION DOSE REDUCTION: This exam was performed according to the departmental dose-optimization program which includes automated exposure control, adjustment of the mA and/or kV according to patient size and/or use of iterative reconstruction technique. CONTRAST:  150m OMNIPAQUE IOHEXOL 350 MG/ML SOLN COMPARISON:  None  Available. FINDINGS: CTA CHEST FINDINGS Cardiovascular: Aortic arch and proximal descending thoracic aorta stent beginning at the level of the left subclavian artery. Stent in the proximal left subclavian artery  including the origin. The aortic root measures 4.2 cm in maximum diameter. The ascending thoracic aorta measures 4.5 cm in maximum diameter. Proximal arch measures 3.8 cm in diameter. The descending thoracic aorta measures 3.4 cm in maximum diameter. No aortic dissection. Enlarged heart with left atrial and left ventricular enlargement. No pericardial effusion. Mediastinum/Nodes: No enlarged mediastinal, hilar, or axillary lymph nodes. Thyroid gland, trachea, and esophagus demonstrate no significant findings. Lungs/Pleura: Minimal bilateral dependent atelectasis and bibasilar linear atelectasis/scarring. Pleural fluid. Musculoskeletal: Thoracic and lower cervical spine degenerative changes. Review of the MIP images confirms the above findings. CTA ABDOMEN AND PELVIS FINDINGS VASCULAR Aorta: Normal caliber aorta without aneurysm, dissection, vasculitis or significant stenosis. Celiac: Patent without evidence of aneurysm, dissection, vasculitis or significant stenosis. SMA: Patent without evidence of aneurysm, dissection, vasculitis or significant stenosis. Renals: Both renal arteries are patent without evidence of aneurysm, dissection, vasculitis, fibromuscular dysplasia or significant stenosis. IMA: Patent without evidence of aneurysm, dissection, vasculitis or significant stenosis. Inflow: Patent without evidence of aneurysm, dissection, vasculitis or significant stenosis. Veins: No obvious venous abnormality within the limitations of this arterial phase study. Review of the MIP images confirms the above findings. NON-VASCULAR Hepatobiliary: Several small liver cysts. Poorly distended gallbladder. Pancreas: Unremarkable. No pancreatic ductal dilatation or surrounding inflammatory changes. Spleen: Normal in size without focal abnormality. Adrenals/Urinary Tract: Adrenal glands are unremarkable. Small simple left renal cyst. This does not need imaging follow-up. Otherwise, the kidneys are normal, without renal  calculi, focal lesion, or hydronephrosis. Bladder is unremarkable. Stomach/Bowel: Small hiatal hernia. Scattered colon diverticula, including the right colon. The appendix is not identified. No evidence of appendicitis. Unremarkable small bowel. Lymphatic: No enlarged lymph nodes. Reproductive: Status post hysterectomy. No adnexal masses. Other: No abdominal wall hernia or abnormality. No abdominopelvic ascites. Musculoskeletal: Lumbar spine degenerative changes, including facet degenerative changes with associated grade 1 anterolisthesis at the L4-5 level. No pars defects or fractures. Review of the MIP images confirms the above findings. IMPRESSION: 1. No aortic dissection. 2. 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 Recommend semi-annual imaging followup by CTA or MRA and referral to cardiothoracic surgery if not already obtained. This recommendation follows 2010 ACCF/AHA/AATS/ACR/ASA/SCA/SCAI/SIR/STS/SVM Guidelines for the Diagnosis and Management of Patients With Thoracic Aortic Disease. Circulation. 2010; [id number]. Aortic aneurysm NOS (ICD10-I71.9) . 3. Distal aortic arch and proximal descending thoracic aorta stent. 4. Cardiomegaly with left atrial and left ventricular enlargement. 5. Colonic diverticulosis. 6. Small hiatal hernia. Electronically Signed   By: Claudie Revering M.D.   On: 11/17/2022 14:30     Assessment/Plan There are no diagnoses linked to this encounter.   Hortencia Pilar, MD  12/06/2022 4:06 PM

## 2022-12-08 ENCOUNTER — Encounter (INDEPENDENT_AMBULATORY_CARE_PROVIDER_SITE_OTHER): Payer: Self-pay | Admitting: Vascular Surgery

## 2022-12-08 ENCOUNTER — Ambulatory Visit (INDEPENDENT_AMBULATORY_CARE_PROVIDER_SITE_OTHER): Payer: 59 | Admitting: Vascular Surgery

## 2022-12-08 VITALS — BP 99/60 | HR 61 | Resp 16 | Ht 64.0 in | Wt 260.0 lb

## 2022-12-08 DIAGNOSIS — I700 Atherosclerosis of aorta: Secondary | ICD-10-CM | POA: Diagnosis not present

## 2022-12-08 DIAGNOSIS — E782 Mixed hyperlipidemia: Secondary | ICD-10-CM

## 2022-12-08 DIAGNOSIS — I7143 Infrarenal abdominal aortic aneurysm, without rupture: Secondary | ICD-10-CM

## 2022-12-08 DIAGNOSIS — I10 Essential (primary) hypertension: Secondary | ICD-10-CM | POA: Diagnosis not present

## 2022-12-20 LAB — HM DIABETES EYE EXAM

## 2022-12-21 ENCOUNTER — Encounter: Payer: Self-pay | Admitting: Nurse Practitioner

## 2023-01-19 ENCOUNTER — Other Ambulatory Visit: Payer: Self-pay | Admitting: Nurse Practitioner

## 2023-01-19 NOTE — Telephone Encounter (Signed)
Medication: Pt is requesting 3 month referral   rosuvastatin (CRESTOR) 5 MG tablet [[id number]]   clopidogrel (PLAVIX) 75 MG tablet [[id number]]   losartan (COZAAR) 50 MG tablet [[id number]]   carvedilol (COREG) 12.5 MG tablet [[id number]]  ENDED   Has the patient contacted their pharmacy? Yes.   (Agent: If no, request that the patient contact the pharmacy for the refill. If patient does not wish to contact the pharmacy document the reason why and proceed with request.) (Agent: If yes, when and what did the pharmacy advise?)  Preferred Pharmacy (with phone number or street name): [facility] [address]), [place] - [address]) [place] [address] Phone: [phone]  Fax: [phone]  Has the patient been seen for an appointment in the last year OR does the patient have an upcoming appointment? Yes.    Agent: Please be advised that RX refills may take up to 3 business days. We ask that you follow-up with your pharmacy.

## 2023-01-20 ENCOUNTER — Other Ambulatory Visit: Payer: Self-pay

## 2023-01-20 MED ORDER — CARVEDILOL 12.5 MG PO TABS: 12.5000 mg | ORAL_TABLET | Freq: Two times a day (BID) | ORAL | 2 refills | Status: DC

## 2023-01-20 NOTE — Telephone Encounter (Signed)
Unable to refill per protocol, Rx request was too soon.  Requested Prescriptions  Pending Prescriptions Disp Refills   rosuvastatin (CRESTOR) 5 MG tablet 90 tablet 1    Sig: Take 1 tablet (5 mg total) by mouth daily.     Cardiovascular:  Antilipid - Statins 2 Failed - 01/19/2023  5:54 PM      Failed - Lipid Panel in normal range within the last 12 months    Cholesterol, Total  Date Value Ref Range Status  08/08/2022 116 100 - 199 mg/dL Final   LDL Chol Calc (NIH)  Date Value Ref Range Status  08/08/2022 55 0 - 99 mg/dL Final   HDL  Date Value Ref Range Status  08/08/2022 49 >39 mg/dL Final   Triglycerides  Date Value Ref Range Status  08/08/2022 55 0 - 149 mg/dL Final         Passed - Cr in normal range and within 360 days    Creatinine, Ser  Date Value Ref Range Status  08/08/2022 0.82 0.57 - 1.00 mg/dL Final         Passed - Patient is not pregnant      Passed - Valid encounter within last 12 months    Recent Outpatient Visits           5 months ago Annual physical exam   [facility] [facility] Larae Grooms, NP   11 months ago Acute cystitis without hematuria   [place] [facility] Larae Grooms, NP   11 months ago Primary hypertension   [place] [facility] Larae Grooms, NP   1 year ago Upper arm joint pain, right   [place] [facility] [place], Corrie Dandy T, NP   1 year ago Primary hypertension   [place] [facility] Larae Grooms, NP       Future Appointments             In 2 weeks Larae Grooms, NP [place] [facility]             clopidogrel (PLAVIX) 75 MG tablet 90 tablet 1    Sig: Take 1 tablet (75 mg total) by mouth daily.     Hematology: Antiplatelets - clopidogrel Passed - 01/19/2023  5:54 PM      Passed - HCT in normal range and within 180 days    Hematocrit  Date Value Ref Range Status  08/08/2022 36.7 34.0 - 46.6 %  Final         Passed - HGB in normal range and within 180 days    Hemoglobin  Date Value Ref Range Status  08/08/2022 12.1 11.1 - 15.9 g/dL Final         Passed - PLT in normal range and within 180 days    Platelets  Date Value Ref Range Status  08/08/2022 243 150 - 450 x10E3/uL Final         Passed - Cr in normal range and within 360 days    Creatinine, Ser  Date Value Ref Range Status  08/08/2022 0.82 0.57 - 1.00 mg/dL Final         Passed - Valid encounter within last 6 months    Recent Outpatient Visits           5 months ago Annual physical exam   [facility] Larae Grooms, NP   11 months ago Acute cystitis without hematuria   [place] [facility] Larae Grooms, NP   11 months ago Primary hypertension   [facility] Larae Grooms, NP   1 year ago Upper arm joint pain, right   [place] [facility] [place], Dorie Rank, NP   1 year ago Primary hypertension   [place] [facility] Larae Grooms, NP       Future Appointments             In 2 weeks Larae Grooms, NP [place] [facility]             losartan (COZAAR) 50 MG tablet 90 tablet 1    Sig: Take 1 tablet (50 mg total) by mouth daily.     Cardiovascular:  Angiotensin Receptor Blockers Passed - 01/19/2023  5:54 PM      Passed - Cr in normal range and within 180 days    Creatinine, Ser  Date Value Ref Range Status  08/08/2022 0.82 0.57 - 1.00 mg/dL Final         Passed - K in normal range and within 180 days    Potassium  Date Value Ref Range Status  08/08/2022 3.9 3.5 - 5.2 mmol/L Final         Passed - Patient is not pregnant      Passed - Last BP in normal range    BP Readings from Last 1 Encounters:  12/08/22 99/60         Passed - Valid encounter within last 6 months    Recent Outpatient Visits           5 months ago Annual physical exam   [facility] Larae Grooms, NP   11 months ago Acute cystitis without hematuria   [place] [facility] Larae Grooms, NP   11 months ago Primary hypertension   [facility] Larae Grooms, NP   1 year ago Upper arm joint pain, right   [place] [facility] [place], Corrie Dandy T, NP   1 year ago Primary hypertension   [place] [facility] Larae Grooms, NP       Future Appointments             In 2 weeks Larae Grooms, NP  [facility]

## 2023-01-20 NOTE — Telephone Encounter (Signed)
Refused Rosuvastatin 5 mg and Plavix 75 mg because they are being requested too soon.

## 2023-01-23 ENCOUNTER — Other Ambulatory Visit: Payer: Self-pay | Admitting: Nurse Practitioner

## 2023-01-24 NOTE — Telephone Encounter (Signed)
Unable to refill per protocol, Rx request was recently refused due to request being too soon. Last refill 08/08/22 for 90 and 1 refill.  Requested Prescriptions  Pending Prescriptions Disp Refills   clopidogrel (PLAVIX) 75 MG tablet [Pharmacy Med Name: Clopidogrel Bisulfate 75 MG Oral Tablet] 90 tablet 0    Sig: Take 1 tablet by mouth once daily     Hematology: Antiplatelets - clopidogrel Passed - 01/23/2023 11:56 AM      Passed - HCT in normal range and within 180 days    Hematocrit  Date Value Ref Range Status  08/08/2022 36.7 34.0 - 46.6 % Final         Passed - HGB in normal range and within 180 days    Hemoglobin  Date Value Ref Range Status  08/08/2022 12.1 11.1 - 15.9 g/dL Final         Passed - PLT in normal range and within 180 days    Platelets  Date Value Ref Range Status  08/08/2022 243 150 - 450 x10E3/uL Final         Passed - Cr in normal range and within 360 days    Creatinine, Ser  Date Value Ref Range Status  08/08/2022 0.82 0.57 - 1.00 mg/dL Final         Passed - Valid encounter within last 6 months    Recent Outpatient Visits           5 months ago Annual physical exam   [place] [facility] Larae Grooms, NP   11 months ago Acute cystitis without hematuria   [place] [facility] Larae Grooms, NP   11 months ago Primary hypertension   [place] [facility] Larae Grooms, NP   1 year ago Upper arm joint pain, right   [place] [facility] [place], Corrie Dandy T, NP   1 year ago Primary hypertension   [place] [facility] Larae Grooms, NP       Future Appointments             In 1 week Larae Grooms, NP [place] [facility]             rosuvastatin (CRESTOR) 5 MG tablet [Pharmacy Med Name: Rosuvastatin Calcium 5 MG Oral Tablet] 90 tablet 0    Sig: Take 1 tablet by mouth once daily     Cardiovascular:  Antilipid - Statins 2  Failed - 01/23/2023 11:56 AM      Failed - Lipid Panel in normal range within the last 12 months    Cholesterol, Total  Date Value Ref Range Status  08/08/2022 116 100 - 199 mg/dL Final   LDL Chol Calc (NIH)  Date Value Ref Range Status  08/08/2022 55 0 - 99 mg/dL Final   HDL  Date Value Ref Range Status  08/08/2022 49 >39 mg/dL Final   Triglycerides  Date Value Ref Range Status  08/08/2022 55 0 - 149 mg/dL Final         Passed - Cr in normal range and within 360 days    Creatinine, Ser  Date Value Ref Range Status  08/08/2022 0.82 0.57 - 1.00 mg/dL Final         Passed - Patient is not pregnant      Passed - Valid encounter within last 12 months    Recent Outpatient Visits           5 months ago Annual physical  exam   [place] [facility] Larae Grooms, NP   11 months ago Acute cystitis without hematuria   [place] [facility] Larae Grooms, NP   11 months ago Primary hypertension   [place] [facility] Larae Grooms, NP   1 year ago Upper arm joint pain, right   [place] [facility] Marjie Skiff, NP   1 year ago Primary hypertension   [place] [facility] Larae Grooms, NP       Future Appointments             In 1 week Larae Grooms, NP [place] [facility]

## 2023-01-25 ENCOUNTER — Other Ambulatory Visit: Payer: Self-pay | Admitting: Nurse Practitioner

## 2023-01-26 NOTE — Telephone Encounter (Signed)
Requested Prescriptions  Pending Prescriptions Disp Refills   clopidogrel (PLAVIX) 75 MG tablet [Pharmacy Med Name: Clopidogrel Bisulfate 75 MG Oral Tablet] 90 tablet 0    Sig: Take 1 tablet by mouth once daily     Hematology: Antiplatelets - clopidogrel Passed - 01/25/2023  3:28 PM      Passed - HCT in normal range and within 180 days    Hematocrit  Date Value Ref Range Status  08/08/2022 36.7 34.0 - 46.6 % Final         Passed - HGB in normal range and within 180 days    Hemoglobin  Date Value Ref Range Status  08/08/2022 12.1 11.1 - 15.9 g/dL Final         Passed - PLT in normal range and within 180 days    Platelets  Date Value Ref Range Status  08/08/2022 243 150 - 450 x10E3/uL Final         Passed - Cr in normal range and within 360 days    Creatinine, Ser  Date Value Ref Range Status  08/08/2022 0.82 0.57 - 1.00 mg/dL Final         Passed - Valid encounter within last 6 months    Recent Outpatient Visits           5 months ago Annual physical exam   [place] [facility] Larae Grooms, NP   11 months ago Acute cystitis without hematuria   [place] [facility] Larae Grooms, NP   11 months ago Primary hypertension   [place] [facility] Larae Grooms, NP   1 year ago Upper arm joint pain, right   [place] [facility] [place], Corrie Dandy T, NP   1 year ago Primary hypertension   [place] [facility] Larae Grooms, NP       Future Appointments             In 1 week Larae Grooms, NP [place] [facility]             rosuvastatin (CRESTOR) 5 MG tablet [Pharmacy Med Name: Rosuvastatin Calcium 5 MG Oral Tablet] 90 tablet 0    Sig: Take 1 tablet by mouth once daily     Cardiovascular:  Antilipid - Statins 2 Failed - 01/25/2023  3:28 PM      Failed - Lipid Panel in normal range within the last 12 months    Cholesterol, Total  Date Value Ref  Range Status  08/08/2022 116 100 - 199 mg/dL Final   LDL Chol Calc (NIH)  Date Value Ref Range Status  08/08/2022 55 0 - 99 mg/dL Final   HDL  Date Value Ref Range Status  08/08/2022 49 >39 mg/dL Final   Triglycerides  Date Value Ref Range Status  08/08/2022 55 0 - 149 mg/dL Final         Passed - Cr in normal range and within 360 days    Creatinine, Ser  Date Value Ref Range Status  08/08/2022 0.82 0.57 - 1.00 mg/dL Final         Passed - Patient is not pregnant      Passed - Valid encounter within last 12 months    Recent Outpatient Visits           5 months ago Annual physical exam    [place] [facility] Larae G[facility], NP   11 months ago Acute cystitis without hematuria   [facility]   11 months ago Primary hypertension   [facility] Larae Grooms, NP   1 year ago Upper arm joint pain, right   [place] [facility] [place], Dorie Rank, NP   1 year ago Primary hypertension   [place] [facility] Larae Grooms, NP       Future Appointments             In 1 week Larae Grooms, NP [place] [facility], [facility]

## 2023-01-28 ENCOUNTER — Other Ambulatory Visit: Payer: Self-pay | Admitting: Nurse Practitioner

## 2023-01-30 NOTE — Telephone Encounter (Signed)
Requested Prescriptions  Pending Prescriptions Disp Refills   rosuvastatin (CRESTOR) 5 MG tablet [Pharmacy Med Name: Rosuvastatin Calcium 5 MG Oral Tablet] 90 tablet 0    Sig: Take 1 tablet by mouth once daily     Cardiovascular:  Antilipid - Statins 2 Failed - 01/28/2023  3:50 PM      Failed - Lipid Panel in normal range within the last 12 months    Cholesterol, Total  Date Value Ref Range Status  08/08/2022 116 100 - 199 mg/dL Final   LDL Chol Calc (NIH)  Date Value Ref Range Status  08/08/2022 55 0 - 99 mg/dL Final   HDL  Date Value Ref Range Status  08/08/2022 49 >39 mg/dL Final   Triglycerides  Date Value Ref Range Status  08/08/2022 55 0 - 149 mg/dL Final         Passed - Cr in normal range and within 360 days    Creatinine, Ser  Date Value Ref Range Status  08/08/2022 0.82 0.57 - 1.00 mg/dL Final         Passed - Patient is not pregnant      Passed - Valid encounter within last 12 months    Recent Outpatient Visits           5 months ago Annual physical exam   [place] [facility] Larae Grooms, NP   11 months ago Acute cystitis without hematuria   [place] [facility] Larae Grooms, NP   12 months ago Primary hypertension   [place] [facility] Larae Grooms, NP   1 year ago Upper arm joint pain, right   [place] [facility] [place], Corrie Dandy T, NP   1 year ago Primary hypertension   [place] [facility] Larae Grooms, NP       Future Appointments             In 1 week Larae Grooms, NP  [facility], [facility]

## 2023-02-06 ENCOUNTER — Ambulatory Visit (INDEPENDENT_AMBULATORY_CARE_PROVIDER_SITE_OTHER): Payer: 59 | Admitting: Nurse Practitioner

## 2023-02-06 ENCOUNTER — Encounter: Payer: Self-pay | Admitting: Nurse Practitioner

## 2023-02-06 VITALS — BP 104/67 | Temp 97.7°F | Wt 207.0 lb

## 2023-02-06 DIAGNOSIS — R7303 Prediabetes: Secondary | ICD-10-CM

## 2023-02-06 DIAGNOSIS — I10 Essential (primary) hypertension: Secondary | ICD-10-CM

## 2023-02-06 DIAGNOSIS — I700 Atherosclerosis of aorta: Secondary | ICD-10-CM

## 2023-02-06 DIAGNOSIS — E782 Mixed hyperlipidemia: Secondary | ICD-10-CM

## 2023-02-06 DIAGNOSIS — I7143 Infrarenal abdominal aortic aneurysm, without rupture: Secondary | ICD-10-CM | POA: Diagnosis not present

## 2023-02-06 DIAGNOSIS — M1711 Unilateral primary osteoarthritis, right knee: Secondary | ICD-10-CM

## 2023-02-06 MED ORDER — LOSARTAN POTASSIUM 50 MG PO TABS: 50.0000 mg | ORAL_TABLET | Freq: Every day | ORAL | 1 refills | Status: DC

## 2023-02-06 MED ORDER — CLOPIDOGREL BISULFATE 75 MG PO TABS: 75.0000 mg | ORAL_TABLET | Freq: Every day | ORAL | 1 refills | Status: DC

## 2023-02-06 MED ORDER — AMLODIPINE BESYLATE 10 MG PO TABS: 10.0000 mg | ORAL_TABLET | Freq: Every day | ORAL | 1 refills | Status: DC

## 2023-02-06 MED ORDER — ROSUVASTATIN CALCIUM 5 MG PO TABS: 5.0000 mg | ORAL_TABLET | Freq: Every day | ORAL | 1 refills | Status: DC

## 2023-02-06 NOTE — Assessment & Plan Note (Signed)
Recommended eating smaller high protein, low fat meals more frequently and exercising 30 mins a day 5 times a week with a goal of 10-15lb weight loss in the next 3 months.  

## 2023-02-06 NOTE — Assessment & Plan Note (Signed)
Continue with statin therapy.  Followed by Cardiology and Vascular surgery.

## 2023-02-06 NOTE — Assessment & Plan Note (Signed)
Labs ordered at visit today.  Will make recommendations based on lab results.   

## 2023-02-06 NOTE — Progress Notes (Signed)
BP 104/67   Temp 97.7 F (36.5 C) (Oral)   Wt 207 lb (93.9 kg)   SpO2 97%   BMI 35.53 kg/m    Subjective:    Patient ID: Amanda Ochoa, female    DOB: 1951/01/12, 72 y.o.   MRN: [id number]  HPI: Amanda Ochoa is a 72 y.o. female  Chief Complaint  Patient presents with   Hypertension   Diabetes   Hyperlipidemia    HYPERTENSION/HYPERLIPIDEMIA Patient is followed by Vascular Surgery and Cardiology.  Patient is on Plavix for this.   Hypertension status: controlled  Satisfied with current treatment? yes Duration of hypertension: years BP monitoring frequency:  daily BP range: mostly <120/80 BP medication side effects:  no Medication compliance: excellent compliance Previous BP meds:amlodipine, carvedilol, and losartan (cozaar) Duration of hyperlipidemia: years Cholesterol medication side effects: no Cholesterol supplements: none Past cholesterol medications: none Medication compliance: excellent compliance Aspirin: no Recent stressors: no Recurrent headaches: no Visual changes: no Palpitations: no Dyspnea: no Chest pain: no Lower extremity edema: no Dizzy/lightheaded: no  PREDIABETES Patient denies concerns.  Not currently taking any medications.   Hypoglycemic episodes:no Polydipsia/polyuria: no Visual disturbance: no Chest pain: no Paresthesias: no Glucose Monitoring: no  Accucheck frequency: Not Checking  Fasting glucose:  Post prandial:  Evening:  Before meals: Taking Insulin?: no  Long acting insulin:  Short acting insulin: Blood Pressure Monitoring: daily Retinal Examination: Up to Date Foot Exam: Up to Date Diabetic Education: Not Completed Pneumovax: Up to Date Influenza: Up to Date Aspirin: no  Patient states she would like to see Orthopedics to try and get the gel injections.  Has right knee pain.     Relevant past medical, surgical, family and social history reviewed and updated as indicated. Interim medical history since our last  visit reviewed. Allergies and medications reviewed and updated.  Review of Systems  Eyes:  Negative for visual disturbance.  Respiratory:  Negative for cough, chest tightness and shortness of breath.   Cardiovascular:  Negative for chest pain, palpitations and leg swelling.  Endocrine: Negative for polydipsia and polyuria.  Neurological:  Negative for dizziness, numbness and headaches.    Per HPI unless specifically indicated above     Objective:    BP 104/67   Temp 97.7 F (36.5 C) (Oral)   Wt 207 lb (93.9 kg)   SpO2 97%   BMI 35.53 kg/m   Wt Readings from Last 3 Encounters:  02/06/23 207 lb (93.9 kg)  12/08/22 260 lb (117.9 kg)  11/08/22 207 lb 3.2 oz (94 kg)    Physical Exam Vitals and nursing note reviewed.  Constitutional:      General: She is not in acute distress.    Appearance: Normal appearance. She is not ill-appearing, toxic-appearing or diaphoretic.  HENT:     Head: Normocephalic.     Right Ear: External ear normal.     Left Ear: External ear normal.     Nose: Nose normal.     Mouth/Throat:     Mouth: Mucous membranes are moist.     Pharynx: Oropharynx is clear.  Eyes:     General:        Right eye: No discharge.        Left eye: No discharge.     Extraocular Movements: Extraocular movements intact.     Conjunctiva/sclera: Conjunctivae normal.     Pupils: Pupils are equal, round, and reactive to light.  Cardiovascular:     Rate and Rhythm: Normal rate and regular  rhythm.     Heart sounds: No murmur heard. Pulmonary:     Effort: Pulmonary effort is normal. No respiratory distress.     Breath sounds: Normal breath sounds. No wheezing or rales.  Abdominal:     General: Abdomen is flat. Bowel sounds are normal. There is no distension.     Palpations: Abdomen is soft.     Tenderness: There is no abdominal tenderness. There is no right CVA tenderness, left CVA tenderness or guarding.  Musculoskeletal:     Cervical back: Neck supple. Pain with movement  present. Decreased range of motion.     Comments: Wearing knee brace  Skin:    General: Skin is warm and dry.     Capillary Refill: Capillary refill takes less than 2 seconds.  Neurological:     General: No focal deficit present.     Mental Status: She is alert and oriented to person, place, and time. Mental status is at baseline.  Psychiatric:        Mood and Affect: Mood normal.        Behavior: Behavior normal.        Thought Content: Thought content normal.        Judgment: Judgment normal.     Results for orders placed or performed in visit on 12/21/22  HM DIABETES EYE EXAM  Result Value Ref Range   HM Diabetic Eye Exam No Retinopathy No Retinopathy      Assessment & Plan:   Problem List Items Addressed This Visit       Cardiovascular and Mediastinum   Hypertension    Chronic.  Controlled.  Continue with current medication regimen of Amlodipine, Carvedilol and Losartan.  Doing well per Cardiology's notes.  Refills sent today. Labs ordered today.  Return to clinic in 6 months for reevaluation.  Call sooner if concerns arise.        Relevant Medications   amLODipine (NORVASC) 10 MG tablet   losartan (COZAAR) 50 MG tablet   rosuvastatin (CRESTOR) 5 MG tablet   Other Relevant Orders   Comp Met (CMET)   Thoracic aorta atherosclerosis (HCC) - Primary    Continue with statin therapy.  Followed by Cardiology and Vascular surgery.       Relevant Medications   amLODipine (NORVASC) 10 MG tablet   losartan (COZAAR) 50 MG tablet   rosuvastatin (CRESTOR) 5 MG tablet   AAA (abdominal aortic aneurysm) without rupture (HCC)    Continue with statin therapy.  Followed by Cardiology and Vascular surgery.       Relevant Medications   amLODipine (NORVASC) 10 MG tablet   losartan (COZAAR) 50 MG tablet   rosuvastatin (CRESTOR) 5 MG tablet     Musculoskeletal and Integument   Primary osteoarthritis of right knee    Referral sent to Orthopedics.        Relevant Orders    Ambulatory referral to Orthopedic Surgery     Other   Prediabetes    Labs ordered at visit today.  Will make recommendations based on lab results.        Relevant Orders   HgB A1c   Mixed hyperlipidemia    Chronic.  Controlled.  Continue with current medication regimen of Crestor 20mg  daily.  Refills sent today.  Labs ordered today.  Return to clinic in 6 months for reevaluation.  Call sooner if concerns arise.        Relevant Medications   amLODipine (NORVASC) 10 MG tablet   losartan (COZAAR)  50 MG tablet   rosuvastatin (CRESTOR) 5 MG tablet   Other Relevant Orders   Lipid Profile   Severe obesity (BMI 35.0-39.9) with comorbidity (HCC)    Recommended eating smaller high protein, low fat meals more frequently and exercising 30 mins a day 5 times a week with a goal of 10-15lb weight loss in the next 3 months.        Follow up plan: Return in about 6 months (around 08/09/2023) for Physical and Fasting labs.

## 2023-02-06 NOTE — Assessment & Plan Note (Signed)
Chronic.  Controlled.  Continue with current medication regimen of Amlodipine, Carvedilol and Losartan.  Doing well per Cardiology's notes.  Refills sent today. Labs ordered today.  Return to clinic in 6 months for reevaluation.  Call sooner if concerns arise.

## 2023-02-06 NOTE — Assessment & Plan Note (Signed)
Continue with statin therapy.  Followed by Cardiology and Vascular surgery.  

## 2023-02-06 NOTE — Assessment & Plan Note (Signed)
Chronic.  Controlled.  Continue with current medication regimen of Crestor 20mg daily.  Refills sent today.  Labs ordered today.  Return to clinic in 6 months for reevaluation.  Call sooner if concerns arise.    

## 2023-02-06 NOTE — Assessment & Plan Note (Signed)
Referral sent to Orthopedics.

## 2023-02-07 LAB — LIPID PANEL
Chol/HDL Ratio: 3 ratio (ref 0.0–4.4)
Cholesterol, Total: 131 mg/dL (ref 100–199)
HDL: 43 mg/dL (ref >39)
LDL Chol Calc (NIH): 72 mg/dL (ref 0–99)
Triglycerides: 83 mg/dL (ref 0–149)
VLDL Cholesterol Cal: 16 mg/dL (ref 5–40)

## 2023-02-07 LAB — COMPREHENSIVE METABOLIC PANEL
ALT: 21 IU/L (ref 0–32)
AST: 22 IU/L (ref 0–40)
Albumin/Globulin Ratio: 1.8 (ref 1.2–2.2)
Albumin: 4.2 g/dL (ref 3.9–4.9)
Alkaline Phosphatase: 93 IU/L (ref 44–121)
BUN/Creatinine Ratio: 16 (ref 12–28)
BUN: 14 mg/dL (ref 8–27)
Bilirubin Total: 0.4 mg/dL (ref 0.0–1.2)
CO2: 25 mmol/L (ref 20–29)
Calcium: 9.7 mg/dL (ref 8.7–10.3)
Chloride: 105 mmol/L (ref 96–106)
Creatinine, Ser: 0.85 mg/dL (ref 0.57–1.00)
Globulin, Total: 2.4 g/dL (ref 1.5–4.5)
Glucose: 85 mg/dL (ref 70–99)
Potassium: 4.5 mmol/L (ref 3.5–5.2)
Sodium: 141 mmol/L (ref 134–144)
Total Protein: 6.6 g/dL (ref 6.0–8.5)
eGFR: 74 mL/min/{1.73_m2} (ref >59)

## 2023-02-07 LAB — HEMOGLOBIN A1C
Est. average glucose Bld gHb Est-mCnc: 128 mg/dL
Hgb A1c MFr Bld: 6.1 % — ABNORMAL HIGH (ref 4.8–5.6)

## 2023-02-07 NOTE — Progress Notes (Signed)
HI [name]. It was nice to see you yesterday.  Your lab work looks good.  A1c remains well controlled in the prediabetic range at 6.1%.  No concerns at this time. Continue with your current medication regimen.  Follow up as discussed.  Please let me know if you have any questions.

## 2023-03-27 ENCOUNTER — Ambulatory Visit (INDEPENDENT_AMBULATORY_CARE_PROVIDER_SITE_OTHER): Payer: 59

## 2023-03-27 VITALS — BP 110/74 | Ht 64.0 in | Wt 203.8 lb

## 2023-03-27 DIAGNOSIS — Z0000 Encounter for general adult medical examination without abnormal findings: Secondary | ICD-10-CM | POA: Diagnosis not present

## 2023-03-27 DIAGNOSIS — Z780 Asymptomatic menopausal state: Secondary | ICD-10-CM

## 2023-03-27 NOTE — Patient Instructions (Signed)
Amanda Ochoa , Thank you for taking time to come for your Medicare Wellness Visit. I appreciate your ongoing commitment to your health goals. Please review the following plan we discussed and let me know if I can assist you in the future.   These are the goals we discussed:  Goals      DIET - EAT MORE FRUITS AND VEGETABLES     Weight (lb) < 200 lb (90.7 kg)        This is a list of the screening recommended for you and due dates:  Health Maintenance  Topic Date Due   COVID-19 Vaccine (4 - 2023-24 season) 06/03/2022   Flu Shot  05/04/2023   Mammogram  11/26/2023   Medicare Annual Wellness Visit  03/26/2024   DTaP/Tdap/Td vaccine (4 - Td or Tdap) 10/02/2027   Colon Cancer Screening  10/19/2028   Pneumonia Vaccine  Completed   DEXA scan (bone density measurement)  Completed   Hepatitis C Screening  Completed   Zoster (Shingles) Vaccine  Completed   HPV Vaccine  Aged Out    Advanced directives: no  Conditions/risks identified: none  Next appointment: Follow up in one year for your annual wellness visit 04/01/24 @ 8:45 am in person   Preventive Care 65 Years and Older, Female Preventive care refers to lifestyle choices and visits with your health care provider that can promote health and wellness. What does preventive care include? A yearly physical exam. This is also called an annual well check. Dental exams once or twice a year. Routine eye exams. Ask your health care provider how often you should have your eyes checked. Personal lifestyle choices, including: Daily care of your teeth and gums. Regular physical activity. Eating a healthy diet. Avoiding tobacco and drug use. Limiting alcohol use. Practicing safe sex. Taking low-dose aspirin every day. Taking vitamin and mineral supplements as recommended by your health care provider. What happens during an annual well check? The services and screenings done by your health care provider during your annual well check will  depend on your age, overall health, lifestyle risk factors, and family history of disease. Counseling  Your health care provider may ask you questions about your: Alcohol use. Tobacco use. Drug use. Emotional well-being. Home and relationship well-being. Sexual activity. Eating habits. History of falls. Memory and ability to understand (cognition). Work and work Astronomer. Reproductive health. Screening  You may have the following tests or measurements: Height, weight, and BMI. Blood pressure. Lipid and cholesterol levels. These may be checked every 5 years, or more frequently if you are over 69 years old. Skin check. Lung cancer screening. You may have this screening every year starting at age 47 if you have a 30-pack-year history of smoking and currently smoke or have quit within the past 15 years. Fecal occult blood test (FOBT) of the stool. You may have this test every year starting at age 49. Flexible sigmoidoscopy or colonoscopy. You may have a sigmoidoscopy every 5 years or a colonoscopy every 10 years starting at age 46. Hepatitis C blood test. Hepatitis B blood test. Sexually transmitted disease (STD) testing. Diabetes screening. This is done by checking your blood sugar (glucose) after you have not eaten for a while (fasting). You may have this done every 1-3 years. Bone density scan. This is done to screen for osteoporosis. You may have this done starting at age 91. Mammogram. This may be done every 1-2 years. Talk to your health care provider about how often you should  have regular mammograms. Talk with your health care provider about your test results, treatment options, and if necessary, the need for more tests. Vaccines  Your health care provider may recommend certain vaccines, such as: Influenza vaccine. This is recommended every year. Tetanus, diphtheria, and acellular pertussis (Tdap, Td) vaccine. You may need a Td booster every 10 years. Zoster vaccine. You may  need this after age 19. Pneumococcal 13-valent conjugate (PCV13) vaccine. One dose is recommended after age 27. Pneumococcal polysaccharide (PPSV23) vaccine. One dose is recommended after age 42. Talk to your health care provider about which screenings and vaccines you need and how often you need them. This information is not intended to replace advice given to you by your health care provider. Make sure you discuss any questions you have with your health care provider. Document Released: 10/16/2015 Document Revised: 06/08/2016 Document Reviewed: 07/21/2015 Elsevier Interactive Patient Education  2017 [employer].  Fall Prevention in the Home Falls can cause injuries. They can happen to people of all ages. There are many things you can do to make your home safe and to help prevent falls. What can I do on the outside of my home? Regularly fix the edges of walkways and driveways and fix any cracks. Remove anything that might make you trip as you walk through a door, such as a raised step or threshold. Trim any bushes or trees on the path to your home. Use bright outdoor lighting. Clear any walking paths of anything that might make someone trip, such as rocks or tools. Regularly check to see if handrails are loose or broken. Make sure that both sides of any steps have handrails. Any raised decks and porches should have guardrails on the edges. Have any leaves, snow, or ice cleared regularly. Use sand or salt on walking paths during winter. Clean up any spills in your garage right away. This includes oil or grease spills. What can I do in the bathroom? Use night lights. Install grab bars by the toilet and in the tub and shower. Do not use towel bars as grab bars. Use non-skid mats or decals in the tub or shower. If you need to sit down in the shower, use a plastic, non-slip stool. Keep the floor dry. Clean up any water that spills on the floor as soon as it happens. Remove soap buildup in  the tub or shower regularly. Attach bath mats securely with double-sided non-slip rug tape. Do not have throw rugs and other things on the floor that can make you trip. What can I do in the bedroom? Use night lights. Make sure that you have a light by your bed that is easy to reach. Do not use any sheets or blankets that are too big for your bed. They should not hang down onto the floor. Have a firm chair that has side arms. You can use this for support while you get dressed. Do not have throw rugs and other things on the floor that can make you trip. What can I do in the kitchen? Clean up any spills right away. Avoid walking on wet floors. Keep items that you use a lot in easy-to-reach places. If you need to reach something above you, use a strong step stool that has a grab bar. Keep electrical cords out of the way. Do not use floor polish or wax that makes floors slippery. If you must use wax, use non-skid floor wax. Do not have throw rugs and other things on the floor  that can make you trip. What can I do with my stairs? Do not leave any items on the stairs. Make sure that there are handrails on both sides of the stairs and use them. Fix handrails that are broken or loose. Make sure that handrails are as long as the stairways. Check any carpeting to make sure that it is firmly attached to the stairs. Fix any carpet that is loose or worn. Avoid having throw rugs at the top or bottom of the stairs. If you do have throw rugs, attach them to the floor with carpet tape. Make sure that you have a light switch at the top of the stairs and the bottom of the stairs. If you do not have them, ask someone to add them for you. What else can I do to help prevent falls? Wear shoes that: Do not have high heels. Have rubber bottoms. Are comfortable and fit you well. Are closed at the toe. Do not wear sandals. If you use a stepladder: Make sure that it is fully opened. Do not climb a closed  stepladder. Make sure that both sides of the stepladder are locked into place. Ask someone to hold it for you, if possible. Clearly mark and make sure that you can see: Any grab bars or handrails. First and last steps. Where the edge of each step is. Use tools that help you move around (mobility aids) if they are needed. These include: Canes. Walkers. Scooters. Crutches. Turn on the lights when you go into a dark area. Replace any light bulbs as soon as they burn out. Set up your furniture so you have a clear path. Avoid moving your furniture around. If any of your floors are uneven, fix them. If there are any pets around you, be aware of where they are. Review your medicines with your doctor. Some medicines can make you feel dizzy. This can increase your chance of falling. Ask your doctor what other things that you can do to help prevent falls. This information is not intended to replace advice given to you by your health care provider. Make sure you discuss any questions you have with your health care provider. Document Released: 07/16/2009 Document Revised: 02/25/2016 Document Reviewed: 10/24/2014 Elsevier Interactive Patient Education  2017 [employer].

## 2023-03-27 NOTE — Progress Notes (Signed)
Subjective:   Amanda Ochoa is a 72 y.o. female who presents for Medicare Annual (Subsequent) preventive examination.  Visit Complete: In person   Review of Systems     Cardiac Risk Factors include: advanced age (>41men, >64 women);hypertension;obesity (BMI >30kg/m2)     Objective:    Today's Vitals   03/27/23 0856 03/27/23 0858  BP: 110/74   Weight: 203 lb 12.8 oz (92.4 kg)   Height: 5\' 4"  (1.626 m)   PainSc:  1    Body mass index is 34.98 kg/m.     03/27/2023    9:13 AM 03/15/2022    9:11 AM 10/19/2021    8:51 AM  Advanced Directives  Does Patient Have a Medical Advance Directive? No No No  Would patient like information on creating a medical advance directive? No - Patient declined No - Patient declined     Current Medications (verified) Outpatient Encounter Medications as of 03/27/2023  Medication Sig   alendronate (FOSAMAX) 70 MG tablet Take 1 tablet (70 mg total) by mouth every 7 (seven) days. Take with a full glass of water on an empty stomach.   amLODipine (NORVASC) 10 MG tablet Take 1 tablet (10 mg total) by mouth daily.   Apoaequorin (PREVAGEN PO) Take by mouth. Unable to verify dosage   Beta Carotene (VITAMIN A) 25000 UNIT capsule Take 25,000 Units by mouth daily.   calcium citrate (CALCITRATE - DOSED IN MG ELEMENTAL CALCIUM) 950 (200 Ca) MG tablet Take 200 mg of elemental calcium by mouth daily.   carvedilol (COREG) 12.5 MG tablet Take 1 tablet (12.5 mg total) by mouth 2 (two) times daily with a meal.   cholecalciferol (VITAMIN D3) 25 MCG (1000 UNIT) tablet Take 1,000 Units by mouth daily.   clopidogrel (PLAVIX) 75 MG tablet Take 1 tablet (75 mg total) by mouth daily.   losartan (COZAAR) 50 MG tablet Take 1 tablet (50 mg total) by mouth daily.   Multiple Minerals-Vitamins (CALCIUM-MAGNESIUM-ZINC-D3) TABS Take 1 tablet by mouth daily.   Multiple Vitamins-Minerals (WOMENS MULTI GUMMIES PO) Take by mouth.   rosuvastatin (CRESTOR) 5 MG tablet Take 1 tablet (5 mg  total) by mouth daily.   No facility-administered encounter medications on file as of 03/27/2023.    Allergies (verified) Penicillins   History: Past Medical History:  Diagnosis Date   Abdominal aneurysm (HCC)    Diverticulitis    GERD (gastroesophageal reflux disease)    Hypertension    Sciatica    Sleep apnea    Past Surgical History:  Procedure Laterality Date   ABDOMINAL HYSTERECTOMY     COLONOSCOPY WITH PROPOFOL N/A 10/19/2021   Procedure: COLONOSCOPY WITH PROPOFOL;  Surgeon: Midge Minium, MD;  Location: [facility] ENDOSCOPY;  Service: Endoscopy;  Laterality: N/A;   CORONARY ANGIOPLASTY WITH STENT PLACEMENT     TOTAL ABDOMINAL HYSTERECTOMY W/ BILATERAL SALPINGOOPHORECTOMY     Family History  Problem Relation Age of Onset   Diabetes Mother    Healthy Daughter    Diabetes Maternal Grandmother    Heart disease Maternal Grandmother    Healthy Maternal Grandfather    Healthy Brother    Healthy Son    Breast cancer Neg Hx    Social History   Socioeconomic History   Marital status: Single    Spouse name: Not on file   Number of children: Not on file   Years of education: Not on file   Highest education level: Not on file  Occupational History   Not on file  Tobacco Use   Smoking status: Never    Passive exposure: Never   Smokeless tobacco: Never  Vaping Use   Vaping Use: Never used  Substance and Sexual Activity   Alcohol use: Never   Drug use: Never   Sexual activity: Not Currently  Other Topics Concern   Not on file  Social History Narrative   Not on file   Social Determinants of Health   Financial Resource Strain: Low Risk  (03/27/2023)   Overall Financial Resource Strain (CARDIA)    Difficulty of Paying Living Expenses: Not hard at all  Food Insecurity: No Food Insecurity (03/27/2023)   Hunger Vital Sign    Worried About Running Out of Food in the Last Year: Never true    Ran Out of Food in the Last Year: Never true  Transportation Needs: No  Transportation Needs (03/27/2023)   PRAPARE - Administrator, Civil Service (Medical): No    Lack of Transportation (Non-Medical): No  Physical Activity: Insufficiently Active (03/27/2023)   Exercise Vital Sign    Days of Exercise per Week: 3 days    Minutes of Exercise per Session: 30 min  Stress: No Stress Concern Present (03/27/2023)   [employer] of Occupational Health - Occupational Stress Questionnaire    Feeling of Stress : Only a little  Social Connections: Moderately Isolated (03/27/2023)   Social Connection and Isolation Panel [NHANES]    Frequency of Communication with Friends and Family: More than three times a week    Frequency of Social Gatherings with Friends and Family: Twice a week    Attends Religious Services: Never    Database administrator or Organizations: Yes    Attends Engineer, structural: 1 to 4 times per year    Marital Status: Separated    Tobacco Counseling Counseling given: Not Answered   Clinical Intake:  Pre-visit preparation completed: Yes  Pain : 0-10 Pain Score: 1  Pain Type: Acute pain Pain Location: Head     Nutritional Risks: None Diabetes: No  How often do you need to have someone help you when you read instructions, pamphlets, or other written materials from your doctor or pharmacy?: 1 - Never  Interpreter Needed?: No  Information entered by :: Kennedy Bucker, LPN   Activities of Daily Living    03/27/2023    9:00 AM  In your present state of health, do you have any difficulty performing the following activities:  Hearing? 1  Comment has hearing aid  Vision? 0  Difficulty concentrating or making decisions? 0  Walking or climbing stairs? 1  Dressing or bathing? 0  Doing errands, shopping? 0  Preparing Food and eating ? N  Using the Toilet? N  In the past six months, have you accidently leaked urine? N  Do you have problems with loss of bowel control? N  Managing your Medications? N  Managing your  Finances? N  Housekeeping or managing your Housekeeping? N    Patient Care Team: Larae Grooms, NP as PCP - General (Nurse Practitioner) Debbe Odea, MD as PCP - Cardiology (Cardiology)  Indicate any recent Medical Services you may have received from other than [facility] providers in the past year (date may be approximate).     Assessment:   This is a routine wellness examination for Leeona.  Hearing/Vision screen Hearing Screening - Comments:: Wears aids Vision Screening - Comments:: Wears glasses-  Dr.Woodard  Dietary issues and exercise activities discussed:     Goals Addressed  This Visit's Progress    DIET - EAT MORE FRUITS AND VEGETABLES         Depression Screen    03/27/2023    9:05 AM 02/06/2023    9:44 AM 08/08/2022    9:25 AM 03/15/2022    9:11 AM 02/17/2022    8:41 AM 02/03/2022   11:07 AM 01/12/2022   10:15 AM  PHQ 2/9 Scores  PHQ - 2 Score 0 0 0 0 0 0 0  PHQ- 9 Score 0 0 0 0 0 0 0    Fall Risk    03/27/2023    9:19 AM 02/06/2023    9:43 AM 08/08/2022    9:25 AM 03/15/2022    9:18 AM 02/17/2022    8:40 AM  Fall Risk   Falls in the past year? 0 0 0 1 0  Number falls in past yr: 0 0 0 0 0  Injury with Fall? 0 0 0 0 0  Risk for fall due to : No Fall Risks No Fall Risks No Fall Risks  No Fall Risks  Follow up Falls prevention discussed Falls evaluation completed Falls evaluation completed Falls evaluation completed;Education provided;Falls prevention discussed Falls evaluation completed    MEDICARE RISK AT HOME:  Medicare Risk at Home - 03/27/23 0919     Any stairs in or around the home? No    If so, are there any without handrails? No    Home free of loose throw rugs in walkways, pet beds, electrical cords, etc? Yes    Adequate lighting in your home to reduce risk of falls? Yes    Life alert? No    Use of a cane, walker or w/c? Yes   cane   Grab bars in the bathroom? Yes    Shower chair or bench in shower? Yes    Elevated toilet  seat or a handicapped toilet? No             TIMED UP AND GO:  Was the test performed?  Yes  Length of time to ambulate 10 feet: 4 sec Gait steady and fast without use of assistive device    Cognitive Function:        03/27/2023    9:24 AM 03/15/2022    9:01 AM  6CIT Screen  What Year? 0 points 0 points  What month? 0 points 0 points  What time? 0 points 0 points  Count back from 20 0 points 0 points  Months in reverse 0 points 0 points  Repeat phrase 0 points 0 points  Total Score 0 points 0 points    Immunizations Immunization History  Administered Date(s) Administered   Fluad Quad(high Dose 65+) 06/14/2021, 07/06/2022   Hepatitis B 08/30/2017, 10/01/2017, 04/06/2018   Influenza-Unspecified 10/08/2015, 06/30/2016, 08/30/2017, 07/21/2018, 06/02/2019   Moderna Sars-Covid-2 Vaccination 01/03/2020, 02/03/2020, 10/24/2020   PPD Test 04/06/2021   Pneumococcal Conjugate-13 05/01/2014, 10/19/2017   Pneumococcal Polysaccharide-23 09/16/2019, 12/17/2019   Tdap 05/01/2014, 05/04/2016, 10/01/2017   Zoster Recombinat (Shingrix) 09/16/2019, 12/17/2019   Zoster, Live 05/01/2014    TDAP status: Up to date  Flu Vaccine status: Up to date  Pneumococcal vaccine status: Up to date  Covid-19 vaccine status: Completed vaccines  Qualifies for Shingles Vaccine? Yes   Zostavax completed Yes   Shingrix Completed?: Yes  Screening Tests Health Maintenance  Topic Date Due   COVID-19 Vaccine (4 - 2023-24 season) 06/03/2022   INFLUENZA VACCINE  05/04/2023   MAMMOGRAM  11/26/2023   Medicare Annual  Wellness (AWV)  03/26/2024   DTaP/Tdap/Td (4 - Td or Tdap) 10/02/2027   Colonoscopy  10/19/2028   Pneumonia Vaccine 74+ Years old  Completed   DEXA SCAN  Completed   Hepatitis C Screening  Completed   Zoster Vaccines- Shingrix  Completed   HPV VACCINES  Aged Out    Health Maintenance  Health Maintenance Due  Topic Date Due   COVID-19 Vaccine (4 - 2023-24 season) 06/03/2022     Colorectal cancer screening: Type of screening: Colonoscopy. Completed 10/19/21. Repeat every 7 years  Mammogram status: Completed 11/25/22. Repeat every year  Bone Density status: Completed 07/01/21. Results reflect: Bone density results: OSTEOPOROSIS. Repeat every 2 years.  Lung Cancer Screening: (Low Dose CT Chest recommended if Age 50-80 years, 20 pack-year currently smoking OR have quit w/in 15years.) does not qualify.    Additional Screening:  Hepatitis C Screening: does qualify; Completed 08/08/22  Vision Screening: Recommended annual ophthalmology exams for early detection of glaucoma and other disorders of the eye. Is the patient up to date with their annual eye exam?  Yes  Who is the provider or what is the name of the office in which the patient attends annual eye exams? Dr. Clydene Pugh If pt is not established with a provider, would they like to be referred to a provider to establish care? No .   Dental Screening: Recommended annual dental exams for proper oral hygiene  Community Resource Referral / Chronic Care Management: CRR required this visit?  No   CCM required this visit?  No     Plan:     I have personally reviewed and noted the following in the patient's chart:   Medical and social history Use of alcohol, tobacco or illicit drugs  Current medications and supplements including opioid prescriptions. Patient is not currently taking opioid prescriptions. Functional ability and status Nutritional status Physical activity Advanced directives List of other physicians Hospitalizations, surgeries, and ER visits in previous 12 months Vitals Screenings to include cognitive, depression, and falls Referrals and appointments  In addition, I have reviewed and discussed with patient certain preventive protocols, quality metrics, and best practice recommendations. A written personalized care plan for preventive services as well as general preventive health  recommendations were provided to patient.     Hal Hope, LPN   [id number]/[id number]   After Visit Summary: no  Nurse Notes: none

## 2023-04-17 ENCOUNTER — Other Ambulatory Visit: Payer: Self-pay

## 2023-04-17 MED ORDER — CARVEDILOL 12.5 MG PO TABS: 12.5000 mg | ORAL_TABLET | Freq: Two times a day (BID) | ORAL | 0 refills | Status: DC

## 2023-05-12 ENCOUNTER — Telehealth: Payer: Self-pay | Admitting: Cardiology

## 2023-05-12 NOTE — Telephone Encounter (Signed)
   Pre-operative Risk Assessment    Patient Name: Amanda Ochoa  DOB: 1950-10-04 MRN: [id number]     Request for Surgical Clearance    Procedure:  Dental Extraction - Amount of Teeth to be Pulled:  1  Date of Surgery:  Clearance 07/05/23                                 Surgeon:  Dr. Mat Carne May Surgeon's Group or Practice Name:  [facility] Phone number:  [phone] Fax number:  [phone]   Type of Clearance Requested:   - Pharmacy:  Hold Clopidogrel (Plavix) instructions   Type of Anesthesia:  Local    Additional requests/questions:    Signed, Shawna Orleans   05/12/2023, 2:03 PM

## 2023-05-12 NOTE — Telephone Encounter (Signed)
Dental office closed. Will reach out on Monday to get correct fax number

## 2023-05-12 NOTE — Telephone Encounter (Signed)
Please find correct FAX number so that we can clear.  I called the office and no one answered.   Thank you, Samara Deist

## 2023-05-15 NOTE — Telephone Encounter (Signed)
Sorry the Fax  Number is [phone] I think the fax number is very Blurry

## 2023-05-15 NOTE — Telephone Encounter (Signed)
Will forward back to pre op. See notes notes from Joni Reining, DNP

## 2023-05-15 NOTE — Telephone Encounter (Signed)
    Primary Cardiologist: Debbe Odea, MD  Chart reviewed as part of pre-operative protocol coverage. Simple dental extractions are considered low risk procedures per guidelines and generally do not require any specific cardiac clearance. It is also generally accepted that for simple extractions and dental cleanings, there is no need to interrupt blood thinner therapy.   SBE prophylaxis is not required for the patient.  I will route this recommendation to the requesting party via Epic fax function and remove from pre-op pool.  Please call with questions.  Sharlene Dory, PA-C 05/15/2023, 12:09 PM

## 2023-06-29 ENCOUNTER — Telehealth: Payer: Self-pay | Admitting: Cardiology

## 2023-06-29 NOTE — Telephone Encounter (Signed)
Spoke with Surgeon office and 1 tooth is getting pulled

## 2023-06-29 NOTE — Telephone Encounter (Signed)
Preoperative team, please contact requesting office and let them know that we are not able to provide blanket coverage.  Please ask how many teeth will be removed.  Once details surrounding procedure are known we will be able to provide recommendations.  Thank you for your help.  Thomasene Ripple. Helaine Yackel NP-C     06/29/2023, 11:35 AM [facility] [address] Office [phone] Fax [phone]

## 2023-06-29 NOTE — Telephone Encounter (Signed)
Clearance fax via [employer]

## 2023-06-29 NOTE — Telephone Encounter (Signed)
Pre-operative Risk Assessment    Patient Name: Amanda Ochoa  DOB: January 28, 1951 MRN: [id number]     Request for Surgical Clearance    Procedure:  Dental Extraction - Amount of Teeth to be Pulled:  not indicated  Date of Surgery:  Clearance 07/05/23                                 Surgeon:  Dr. Mat Carne  MayDr. Mat Carne May Surgeon's Group or Practice Name:  [facility] Phone number:  [phone] Fax number:  [phone]   Type of Clearance Requested:   - Pharmacy:  Hold Clopidogrel (Plavix) per instruction   Type of Anesthesia:  Local    Additional requests/questions:    SignedShawna Orleans   06/29/2023, 11:17 AM

## 2023-06-29 NOTE — Telephone Encounter (Signed)
    Primary Cardiologist: Debbe Odea, MD  Chart reviewed as part of pre-operative protocol coverage. Simple dental extractions are considered low risk procedures per guidelines and generally do not require any specific cardiac clearance. It is also generally accepted that for simple extractions and dental cleanings, there is no need to interrupt blood thinner therapy.   SBE prophylaxis is not required for the patient.  I will route this recommendation to the requesting party via Epic fax function and remove from pre-op pool.  Please call with questions.  Ronney Asters, NP 06/29/2023, 2:07 PM

## 2023-07-04 ENCOUNTER — Ambulatory Visit
Admission: RE | Admit: 2023-07-04 | Discharge: 2023-07-04 | Disposition: A | Discharge status: Home / Self Care | Payer: 59 | Source: Ambulatory Visit | Attending: Nurse Practitioner | Admitting: Nurse Practitioner

## 2023-07-04 DIAGNOSIS — Z780 Asymptomatic menopausal state: Secondary | ICD-10-CM | POA: Insufficient documentation

## 2023-07-04 NOTE — Progress Notes (Signed)
Contacted via MyChart   Good afternoon Rosaelena, your bone density has returned and remains stable with no osteoporosis noted.  Some thinning of bone, osteopenia, present.  Continue Fosamax for now and Clydie Braun and you can discuss holiday off this in future.

## 2023-07-25 NOTE — Progress Notes (Unsigned)
Cardiology Clinic Note   Date: 07/26/2023 ID: Juliya, Ando 18-May-1951, MRN [id number]  Primary Cardiologist:  Debbe Odea, MD  Patient Profile    Amanda Ochoa is a 72 y.o. female who presents to the clinic today for routine follow up.     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 S/p thoracic endograft stent to the distal thoracic arch to proximal descending thoracic aorta 2020. Echo 10/13/2022: EF 45 to 50%.  Moderate LVH.  Grade I DD.  Normal RV function.  Normal PA pressure.  Moderate LAE.  Mild AI.  Mild dilatation of aortic root 43 mm, mild dilatation of ascending aorta 45 mm. CTA chest aorta 11/17/2022: No aortic dissection.  Aortic root dilatation without irascible dilatation of the ascending thoracic aorta with maximum diameter 4.5 cm and dilatation of the proximal aortic arch with maximum diameter better 3.8 cm.  Recommend semiannual imaging.  Distal aortic arch and proximal descending thoracic aorta stent. Left subclavian artery stenosis. S/p left subclavian stent 2020. Hypertension. Hyperlipidemia. Lipid panel 02/06/2023: LDL 72, HDL 43, TG 83, total 131. Prediabetes. OSA. On CPAP.     History of Present Illness    Amanda Ochoa was first evaluated by Dr. Azucena Cecil on 10/05/2022 to establish care.  Previously followed in [place].  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  She underwent endograft placement leading to stenosis of left subclavian artery and subsequent stent was placed.  She was started on antiplatelets.  BP was low normal and she was bradycardic at the time of her visit.  Carvedilol was reduced.  She was referred to vascular surgery for further management of penetrating ulcer of aorta and subclavian artery stenosis.  Echo showed low normal LV function, moderate LVH, Grade I DD, mild  dilatation of aortic root/ascending aorta (details above).  Patient was evaluated by Dr. Gilda Crease, vascular surgery, on 12/08/2022.  CTA was reviewed with patient (details above).  It was felt thoracic stent graft was in excellent position.  No surgery/intervention was indicated at that time.  Plan for continued surveillance with CT scan.  Discussed the use of AI scribe software for clinical note transcription with the patient, who gave verbal consent to proceed.  The patient presents for a routine follow-up. She denies chest pain, shortness of breath, palpitations, or lower extremity edema. She moved from [place] to be closer to family and lives in a retirement community. The patient is active at home, doing a lot of cleaning and going out for groceries and to [employer]. She checks BP regularly at home, and it typically runs around 117/80 with a pulse of 64. 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 She stopped those, as they were not working and is under the care of a retina specialist in [place].        ROS: All other systems reviewed and are otherwise negative except as noted in History of Present Illness.  Studies Reviewed    EKG Interpretation Date/Time:  Wednesday July 26 2023 09:58:40 EDT Ventricular Rate:  64 PR Interval:  196 QRS Duration:  104 QT Interval:  426 QTC Calculation: 439 R Axis:   -43  Text Interpretation: Normal sinus rhythm Left axis deviation Minimal voltage criteria for LVH, may be normal variant ( Cornell product ) Nonspecific T wave abnormality When compared  with ECG of 10/05/2022 (not in [facility]) no significant changes Confirmed by Carlos Levering [phone]) on 07/26/2023 10:12:09 AM           Physical Exam    VS:  BP 100/68 (BP Location: Left Arm, Patient Position: Sitting, Cuff Size: Large)   Pulse 64   Ht 5\' 4"  (1.626 m)   Wt 202 lb 8 oz (91.9 kg)   SpO2 96%   BMI 34.76 kg/m  ,  BMI Body mass index is 34.76 kg/m.  GEN: Well nourished, well developed, in no acute distress. Neck: No JVD or carotid bruits. Cardiac:  RRR. No murmurs. No rubs or gallops.   Respiratory:  Respirations regular and unlabored. Clear to auscultation without rales, wheezing or rhonchi. GI: Soft, nontender, nondistended. Extremities: Radials 2+ on the right and 1+ on the left. DP/PT 2+ and equal bilaterally. No clubbing or cyanosis. No edema.  Skin: Warm and dry, no rash. Neuro: Strength intact.  Assessment & Plan      Penetrating atherosclerotic ulcer of aorta S/p thoracic endograft stent to distal thoracic arch to proximal descending thoracic aorta 2020 performed in [place].  Patient evaluated by vascular surgery March 2024.  Patient denies chest pain, back pain, shortness of breath.  -Continue to follow with vascular surgery. -Continue Plavix, atorvastatin.  Aortic root/ascending aortic dilatation Echo January 2024 showed low normal LV function, normal RV function, mild dilatation of aortic root 43 mm, mild dilatation of ascending aorta 45 mm.  Patient denies chest pain, abdominal pain, shortness of breath, or DOE.   -Continue annual surveillance.  Left subclavian stenosis S/p left subclavian stent 2020 performed in [facility]. 2+ radial pulse on the right and 1+ on the left.  -Continue to follow with vascular surgery  Hypertension BP today 100/68.  Home BP typically 117/80.  -Continue amlodipine, losartan.  Hyperlipidemia LDL May 2024 72.   -Continue rosuvastatin.      Disposition: Return in 6 months or sooner as needed.          Signed, Etta Grandchild. Darnise Montag, DNP, NP-C

## 2023-07-26 ENCOUNTER — Ambulatory Visit: Payer: 59 | Attending: Student | Admitting: Student

## 2023-07-26 ENCOUNTER — Encounter: Payer: Self-pay | Admitting: Student

## 2023-07-26 VITALS — BP 100/68 | HR 64 | Ht 64.0 in | Wt 202.5 lb

## 2023-07-26 DIAGNOSIS — E782 Mixed hyperlipidemia: Secondary | ICD-10-CM

## 2023-07-26 DIAGNOSIS — I771 Stricture of artery: Secondary | ICD-10-CM | POA: Diagnosis not present

## 2023-07-26 DIAGNOSIS — I719 Aortic aneurysm of unspecified site, without rupture: Secondary | ICD-10-CM

## 2023-07-26 DIAGNOSIS — I77810 Thoracic aortic ectasia: Secondary | ICD-10-CM

## 2023-07-26 DIAGNOSIS — I10 Essential (primary) hypertension: Secondary | ICD-10-CM

## 2023-07-26 NOTE — Patient Instructions (Signed)
Medication Instructions:  No changes *If you need a refill on your cardiac medications before your next appointment, please call your pharmacy*   Lab Work: None ordered If you have labs (blood work) drawn today and your tests are completely normal, you will receive your results only by: MyChart Message (if you have MyChart) OR A paper copy in the mail If you have any lab test that is abnormal or we need to change your treatment, we will call you to review the results.   Testing/Procedures: None ordered   Follow-Up: At [facility], you and your health needs are our priority.  As part of our continuing mission to provide you with exceptional heart care, we have created designated Provider Care Teams.  These Care Teams include your primary Cardiologist (physician) and Advanced Practice Providers (APPs -  Physician Assistants and Nurse Practitioners) who all work together to provide you with the care you need, when you need it.  We recommend signing up for the patient portal called "MyChart".  Sign up information is provided on this After Visit Summary.  MyChart is used to connect with patients for Virtual Visits (Telemedicine).  Patients are able to view lab/test results, encounter notes, upcoming appointments, etc.  Non-urgent messages can be sent to your provider as well.   To learn more about what you can do with MyChart, go to [email or web address].    Your next appointment:   6 month(s)  Provider:   Debbe Odea, MD

## 2023-07-30 ENCOUNTER — Other Ambulatory Visit: Payer: Self-pay | Admitting: Nurse Practitioner

## 2023-07-31 ENCOUNTER — Other Ambulatory Visit: Payer: Self-pay | Admitting: Nurse Practitioner

## 2023-07-31 ENCOUNTER — Telehealth: Payer: Self-pay | Admitting: Cardiology

## 2023-07-31 MED ORDER — CARVEDILOL 12.5 MG PO TABS: 12.5000 mg | ORAL_TABLET | Freq: Two times a day (BID) | ORAL | 2 refills | Status: DC

## 2023-07-31 MED ORDER — ALENDRONATE SODIUM 70 MG PO TABS: 70.0000 mg | ORAL_TABLET | ORAL | 11 refills | Status: DC

## 2023-07-31 NOTE — Telephone Encounter (Signed)
Copied from CRM [id number]. Topic: General - Other >> Jul 31, 2023  1:01 PM Dondra Prader E wrote: Reason for CRM: Pt called to report that she will be out of her current supply by next week for alendronate. Requesting update on refill request

## 2023-07-31 NOTE — Telephone Encounter (Signed)
*  STAT* If patient is at the pharmacy, call can be transferred to refill team.   1. Which medications need to be refilled? (please list name of each medication and dose if known) carvedilol (COREG) 12.5 MG tablet    2. Would you like to learn more about the convenience, safety, & potential cost savings by using the [facility]?   3. Are you open to using the [facility] (Type [facility].  ).   4. Which pharmacy/location (including street and city if local pharmacy) is medication to be sent to? [id number] - [place] ([place]), [place] - [address]    5. Do they need a 30 day or 90 day supply? 90 day

## 2023-07-31 NOTE — Telephone Encounter (Signed)
Requested Prescriptions   Signed Prescriptions Disp Refills   carvedilol (COREG) 12.5 MG tablet 180 tablet 2    Sig: Take 1 tablet (12.5 mg total) by mouth 2 (two) times daily with a meal.    Authorizing Provider: Debbe Odea    Ordering User: Kendrick Fries

## 2023-08-01 NOTE — Telephone Encounter (Signed)
Call to pharmacy- message left on VM- medication request refused by office- too soon

## 2023-08-01 NOTE — Telephone Encounter (Signed)
Request was refilled 07/31/23 duplicate request.E-Prescribing Status: Receipt confirmed by pharmacy (07/31/2023  2:24 PM EDT).  Requested Prescriptions  Pending Prescriptions Disp Refills   alendronate (FOSAMAX) 70 MG tablet [Pharmacy Med Name: Alendronate Sodium 70 MG Oral Tablet] 12 tablet 0    Sig: TAKE 1 TABLET BY MOUTH EVERY 7 DAYS. TAKE ON AN EMPTY STOMACH WITH A FULL GLASS OF WATER.     Endocrinology:  Bisphosphonates Failed - 07/30/2023 12:52 PM      Failed - Vitamin D in normal range and within 360 days    No results found for: "WJ1914NW2", "NF6213YQ6", "VD125OH2TOT", "25OHVITD3", "25OHVITD2", "25OHVITD1", "VD25OH"       Failed - Mg Level in normal range and within 360 days    No results found for: "MG"       Failed - Phosphate in normal range and within 360 days    No results found for: "PHOS"       Passed - Ca in normal range and within 360 days    Calcium  Date Value Ref Range Status  02/06/2023 9.7 8.7 - 10.3 mg/dL Final         Passed - Cr in normal range and within 360 days    Creatinine, Ser  Date Value Ref Range Status  02/06/2023 0.85 0.57 - 1.00 mg/dL Final         Passed - eGFR is 30 or above and within 360 days    eGFR  Date Value Ref Range Status  02/06/2023 74 >59 mL/min/1.73 Final         Passed - Valid encounter within last 12 months    Recent Outpatient Visits           5 months ago Thoracic aorta atherosclerosis (HCC)   [place] [facility] Larae Grooms, NP   11 months ago Annual physical exam   [place] [facility] Larae Grooms, NP   1 year ago Acute cystitis without hematuria   [place] [facility] Larae Grooms, NP   1 year ago Primary hypertension   [place] [facility] Larae Grooms, NP   1 year ago Upper arm joint pain, right   [place] [facility] Marjie Skiff, NP       Future Appointments             In 1 week Larae Grooms, NP [place] [facility]            Passed - Bone Mineral Density or Dexa Scan completed in the last 2 years

## 2023-08-09 NOTE — Progress Notes (Unsigned)
There were no vitals taken for this visit.   Subjective:    Patient ID: Amanda Ochoa, female    DOB: Jul 30, 1951, 72 y.o.   MRN: [id number]  HPI: Amanda Ochoa is a 72 y.o. female presenting on 08/10/2023 for comprehensive medical examination. Current medical complaints include:none  She currently lives with: Menopausal Symptoms: no  HYPERTENSION without Chronic Kidney Disease Hypertension status: controlled  Satisfied with current treatment? yes Duration of hypertension: years BP monitoring frequency:  daily BP range: 118/72 BP medication side effects:  no Medication compliance: excellent compliance Previous BP meds:amlodipine, carvedilol, and losartan (cozaar) Aspirin: no Recurrent headaches: no Visual changes: no Palpitations: no Dyspnea: no Chest pain: no Lower extremity edema: no Dizzy/lightheaded: no   Depression Screen done today and results listed below:     03/27/2023    9:05 AM 02/06/2023    9:44 AM 08/08/2022    9:25 AM 03/15/2022    9:11 AM 02/17/2022    8:41 AM  Depression screen PHQ 2/9  Decreased Interest 0 0 0 0 0  Down, Depressed, Hopeless 0 0 0 0 0  PHQ - 2 Score 0 0 0 0 0  Altered sleeping 0 0 0 0 0  Tired, decreased energy 0 0 0 0 0  Change in appetite 0 0 0 0 0  Feeling bad or failure about yourself  0 0 0 0 0  Trouble concentrating 0 0 0 0 0  Moving slowly or fidgety/restless 0 0 0 0 0  Suicidal thoughts 0 0 0 0 0  PHQ-9 Score 0 0 0 0 0  Difficult doing work/chores Not difficult at all Not difficult at all Not difficult at all Not difficult at all Not difficult at all    The patient does not have a history of falls. I did complete a risk assessment for falls. A plan of care for falls was documented.   Past Medical History:  Past Medical History:  Diagnosis Date   Abdominal aneurysm (HCC)    Diverticulitis    GERD (gastroesophageal reflux disease)    Hypertension    Sciatica    Sleep apnea     Surgical History:  Past Surgical  History:  Procedure Laterality Date   ABDOMINAL HYSTERECTOMY     COLONOSCOPY WITH PROPOFOL N/A 10/19/2021   Procedure: COLONOSCOPY WITH PROPOFOL;  Surgeon: Midge Minium, MD;  Location: [facility] ENDOSCOPY;  Service: Endoscopy;  Laterality: N/A;   CORONARY ANGIOPLASTY WITH STENT PLACEMENT     TOTAL ABDOMINAL HYSTERECTOMY W/ BILATERAL SALPINGOOPHORECTOMY      Medications:  Current Outpatient Medications on File Prior to Visit  Medication Sig   alendronate (FOSAMAX) 70 MG tablet Take 1 tablet (70 mg total) by mouth every 7 (seven) days. Take with a full glass of water on an empty stomach.   amLODipine (NORVASC) 10 MG tablet Take 1 tablet (10 mg total) by mouth daily.   Apoaequorin (PREVAGEN PO) Take by mouth daily. Unable to verify dosage   carvedilol (COREG) 12.5 MG tablet Take 1 tablet (12.5 mg total) by mouth 2 (two) times daily with a meal.   cholecalciferol (VITAMIN D3) 25 MCG (1000 UNIT) tablet Take 1,000 Units by mouth daily.   clopidogrel (PLAVIX) 75 MG tablet Take 1 tablet (75 mg total) by mouth daily.   losartan (COZAAR) 50 MG tablet Take 1 tablet (50 mg total) by mouth daily.   Multiple Minerals-Vitamins (CALCIUM-MAGNESIUM-ZINC-D3) TABS Take 1 tablet by mouth daily.   Multiple Vitamins-Minerals (WOMENS MULTI GUMMIES PO) Take  by mouth.   rosuvastatin (CRESTOR) 5 MG tablet Take 1 tablet (5 mg total) by mouth daily.   No current facility-administered medications on file prior to visit.    Allergies:  Allergies  Allergen Reactions   Penicillins Itching and Rash    Social History:  Social History   Socioeconomic History   Marital status: Single    Spouse name: Not on file   Number of children: Not on file   Years of education: Not on file   Highest education level: Not on file  Occupational History   Not on file  Tobacco Use   Smoking status: Never    Passive exposure: Never   Smokeless tobacco: Never  Vaping Use   Vaping status: Never Used  Substance and Sexual Activity    Alcohol use: Never   Drug use: Never   Sexual activity: Not Currently  Other Topics Concern   Not on file  Social History Narrative   Not on file   Social Determinants of Health   Financial Resource Strain: Low Risk  (03/27/2023)   Overall Financial Resource Strain (CARDIA)    Difficulty of Paying Living Expenses: Not hard at all  Food Insecurity: No Food Insecurity (03/27/2023)   Hunger Vital Sign    Worried About Running Out of Food in the Last Year: Never true    Ran Out of Food in the Last Year: Never true  Transportation Needs: No Transportation Needs (03/27/2023)   PRAPARE - Administrator, Civil Service (Medical): No    Lack of Transportation (Non-Medical): No  Physical Activity: Insufficiently Active (03/27/2023)   Exercise Vital Sign    Days of Exercise per Week: 3 days    Minutes of Exercise per Session: 30 min  Stress: No Stress Concern Present (03/27/2023)   [employer] of Occupational Health - Occupational Stress Questionnaire    Feeling of Stress : Only a little  Social Connections: Moderately Isolated (03/27/2023)   Social Connection and Isolation Panel [NHANES]    Frequency of Communication with Friends and Family: More than three times a week    Frequency of Social Gatherings with Friends and Family: Twice a week    Attends Religious Services: Never    Database administrator or Organizations: Yes    Attends Banker Meetings: 1 to 4 times per year    Marital Status: Separated  Intimate Partner Violence: Not At Risk (03/27/2023)   Humiliation, Afraid, Rape, and Kick questionnaire    Fear of Current or Ex-Partner: No    Emotionally Abused: No    Physically Abused: No    Sexually Abused: No   Social History   Tobacco Use  Smoking Status Never   Passive exposure: Never  Smokeless Tobacco Never   Social History   Substance and Sexual Activity  Alcohol Use Never    Family History:  Family History  Problem Relation Age of  Onset   Diabetes Mother    Healthy Brother    Diabetes Maternal Grandmother    Heart disease Maternal Grandmother    Healthy Maternal Grandfather    Healthy Daughter    Healthy Son    Breast cancer Neg Hx     Past medical history, surgical history, medications, allergies, family history and social history reviewed with patient today and changes made to appropriate areas of the chart.   Review of Systems  Eyes:  Negative for blurred vision and double vision.  Respiratory:  Negative for shortness of  breath.   Cardiovascular:  Negative for chest pain, palpitations and leg swelling.  Neurological:  Negative for dizziness and headaches.   All other ROS negative except what is listed above and in the HPI.      Objective:    There were no vitals taken for this visit.  Wt Readings from Last 3 Encounters:  07/26/23 202 lb 8 oz (91.9 kg)  03/27/23 203 lb 12.8 oz (92.4 kg)  02/06/23 207 lb (93.9 kg)    Physical Exam Vitals and nursing note reviewed.  Constitutional:      General: She is awake. She is not in acute distress.    Appearance: Normal appearance. She is well-developed. She is obese. She is not ill-appearing.  HENT:     Head: Normocephalic and atraumatic.     Right Ear: Hearing, tympanic membrane, ear canal and external ear normal. No drainage.     Left Ear: Hearing, tympanic membrane, ear canal and external ear normal. No drainage.     Nose: Nose normal.     Right Sinus: No maxillary sinus tenderness or frontal sinus tenderness.     Left Sinus: No maxillary sinus tenderness or frontal sinus tenderness.     Mouth/Throat:     Mouth: Mucous membranes are moist.     Pharynx: Oropharynx is clear. Uvula midline. No pharyngeal swelling, oropharyngeal exudate or posterior oropharyngeal erythema.  Eyes:     General: Lids are normal.        Right eye: No discharge.        Left eye: No discharge.     Extraocular Movements: Extraocular movements intact.     Conjunctiva/sclera:  Conjunctivae normal.     Pupils: Pupils are equal, round, and reactive to light.     Visual Fields: Right eye visual fields normal and left eye visual fields normal.  Neck:     Thyroid: No thyromegaly.     Vascular: No carotid bruit.     Trachea: Trachea normal.  Cardiovascular:     Rate and Rhythm: Normal rate and regular rhythm.     Heart sounds: Normal heart sounds. No murmur heard.    No gallop.  Pulmonary:     Effort: Pulmonary effort is normal. No accessory muscle usage or respiratory distress.     Breath sounds: Normal breath sounds.  Chest:  Breasts:    Right: Normal.     Left: Normal.  Abdominal:     General: Bowel sounds are normal.     Palpations: Abdomen is soft. There is no hepatomegaly or splenomegaly.     Tenderness: There is no abdominal tenderness.  Musculoskeletal:        General: Normal range of motion.     Cervical back: Normal range of motion and neck supple.     Right lower leg: No edema.     Left lower leg: No edema.  Lymphadenopathy:     Head:     Right side of head: No submental, submandibular, tonsillar, preauricular or posterior auricular adenopathy.     Left side of head: No submental, submandibular, tonsillar, preauricular or posterior auricular adenopathy.     Cervical: No cervical adenopathy.     Upper Body:     Right upper body: No supraclavicular, axillary or pectoral adenopathy.     Left upper body: No supraclavicular, axillary or pectoral adenopathy.  Skin:    General: Skin is warm and dry.     Capillary Refill: Capillary refill takes less than 2 seconds.  Findings: No rash.  Neurological:     Mental Status: She is alert and oriented to person, place, and time.     Gait: Gait is intact.     Deep Tendon Reflexes: Reflexes are normal and symmetric.     Reflex Scores:      Brachioradialis reflexes are 2+ on the right side and 2+ on the left side.      Patellar reflexes are 2+ on the right side and 2+ on the left side. Psychiatric:         Attention and Perception: Attention normal.        Mood and Affect: Mood normal.        Speech: Speech normal.        Behavior: Behavior normal. Behavior is cooperative.        Thought Content: Thought content normal.        Judgment: Judgment normal.     Results for orders placed or performed in visit on 02/06/23  Comp Met (CMET)  Result Value Ref Range   Glucose 85 70 - 99 mg/dL   BUN 14 8 - 27 mg/dL   Creatinine, Ser 9.62 0.57 - 1.00 mg/dL   eGFR 74 >95 MW/UXL/2.44   BUN/Creatinine Ratio 16 12 - 28   Sodium 141 134 - 144 mmol/L   Potassium 4.5 3.5 - 5.2 mmol/L   Chloride 105 96 - 106 mmol/L   CO2 25 20 - 29 mmol/L   Calcium 9.7 8.7 - 10.3 mg/dL   Total Protein 6.6 6.0 - 8.5 g/dL   Albumin 4.2 3.9 - 4.9 g/dL   Globulin, Total 2.4 1.5 - 4.5 g/dL   Albumin/Globulin Ratio 1.8 1.2 - 2.2   Bilirubin Total 0.4 0.0 - 1.2 mg/dL   Alkaline Phosphatase 93 44 - 121 IU/L   AST 22 0 - 40 IU/L   ALT 21 0 - 32 IU/L  Lipid Profile  Result Value Ref Range   Cholesterol, Total 131 100 - 199 mg/dL   Triglycerides 83 0 - 149 mg/dL   HDL 43 >01 mg/dL   VLDL Cholesterol Cal 16 5 - 40 mg/dL   LDL Chol Calc (NIH) 72 0 - 99 mg/dL   Chol/HDL Ratio 3.0 0.0 - 4.4 ratio  HgB A1c  Result Value Ref Range   Hgb A1c MFr Bld 6.1 (H) 4.8 - 5.6 %   Est. average glucose Bld gHb Est-mCnc 128 mg/dL      Assessment & Plan:   Problem List Items Addressed This Visit       Cardiovascular and Mediastinum   Hypertension   AAA (abdominal aortic aneurysm) without rupture (HCC) - Primary     Other   Prediabetes   Severe obesity (BMI 35.0-39.9) with comorbidity (HCC)     Follow up plan: No follow-ups on file.   LABORATORY TESTING:  - Pap smear: not applicable  IMMUNIZATIONS:   - Tdap: Tetanus vaccination status reviewed: last tetanus booster within 10 years. - Influenza: Up to date - Pneumovax: Up to date - Prevnar: Up to date - COVID: Up to date - HPV: Not applicable - Shingrix vaccine:  Up to date  SCREENING: -Mammogram: Up to date  - Colonoscopy: Up to date  - Bone Density: Up to date  -Hearing Test: Not applicable  -Spirometry: Not applicable   PATIENT COUNSELING:   Advised to take 1 mg of folate supplement per day if capable of pregnancy.   Sexuality: Discussed sexually transmitted diseases, partner selection, use of  condoms, avoidance of unintended pregnancy  and contraceptive alternatives.   Advised to avoid cigarette smoking.  I discussed with the patient that most people either abstain from alcohol or drink within safe limits (<=14/week and <=4 drinks/occasion for males, <=7/weeks and <= 3 drinks/occasion for females) and that the risk for alcohol disorders and other health effects rises proportionally with the number of drinks per week and how often a drinker exceeds daily limits.  Discussed cessation/primary prevention of drug use and availability of treatment for abuse.   Diet: Encouraged to adjust caloric intake to maintain  or achieve ideal body weight, to reduce intake of dietary saturated fat and total fat, to limit sodium intake by avoiding high sodium foods and not adding table salt, and to maintain adequate dietary potassium and calcium preferably from fresh fruits, vegetables, and low-fat dairy products.    stressed the importance of regular exercise  Injury prevention: Discussed safety belts, safety helmets, smoke detector, smoking near bedding or upholstery.   Dental health: Discussed importance of regular tooth brushing, flossing, and dental visits.    NEXT PREVENTATIVE PHYSICAL DUE IN 1 YEAR. No follow-ups on file.

## 2023-08-10 ENCOUNTER — Ambulatory Visit (INDEPENDENT_AMBULATORY_CARE_PROVIDER_SITE_OTHER): Payer: 59 | Admitting: Nurse Practitioner

## 2023-08-10 ENCOUNTER — Encounter: Payer: Self-pay | Admitting: Nurse Practitioner

## 2023-08-10 VITALS — BP 102/68 | HR 62 | Temp 98.4°F | Ht 64.0 in | Wt 199.4 lb

## 2023-08-10 DIAGNOSIS — Z0000 Encounter for general adult medical examination without abnormal findings: Secondary | ICD-10-CM | POA: Diagnosis not present

## 2023-08-10 DIAGNOSIS — I7143 Infrarenal abdominal aortic aneurysm, without rupture: Secondary | ICD-10-CM

## 2023-08-10 DIAGNOSIS — I10 Essential (primary) hypertension: Secondary | ICD-10-CM | POA: Diagnosis not present

## 2023-08-10 DIAGNOSIS — Z1231 Encounter for screening mammogram for malignant neoplasm of breast: Secondary | ICD-10-CM

## 2023-08-10 DIAGNOSIS — R7303 Prediabetes: Secondary | ICD-10-CM

## 2023-08-10 LAB — URINALYSIS, ROUTINE W REFLEX MICROSCOPIC
Bilirubin, UA: NEGATIVE
Glucose, UA: NEGATIVE
Ketones, UA: NEGATIVE
Leukocytes,UA: NEGATIVE
Nitrite, UA: NEGATIVE
Protein,UA: NEGATIVE
RBC, UA: NEGATIVE
Specific Gravity, UA: 1.01 (ref 1.005–1.030)
Urobilinogen, Ur: 0.2 mg/dL (ref 0.2–1.0)
pH, UA: 7 (ref 5.0–7.5)

## 2023-08-10 MED ORDER — LOSARTAN POTASSIUM 50 MG PO TABS: 50.0000 mg | ORAL_TABLET | Freq: Every day | ORAL | 1 refills | Status: DC

## 2023-08-10 MED ORDER — AMLODIPINE BESYLATE 10 MG PO TABS: 10.0000 mg | ORAL_TABLET | Freq: Every day | ORAL | 1 refills | Status: DC

## 2023-08-10 MED ORDER — CLOPIDOGREL BISULFATE 75 MG PO TABS: 75.0000 mg | ORAL_TABLET | Freq: Every day | ORAL | 1 refills | Status: DC

## 2023-08-10 MED ORDER — ROSUVASTATIN CALCIUM 5 MG PO TABS: 5.0000 mg | ORAL_TABLET | Freq: Every day | ORAL | 1 refills | Status: DC

## 2023-08-10 NOTE — Assessment & Plan Note (Signed)
Chronic.  Controlled.  Continue with current medication regimen of Amlodipine, Carvedilol and Losartan.  Refills sent today. Labs ordered today.  Return to clinic in 6 months for reevaluation.  Call sooner if concerns arise.

## 2023-08-10 NOTE — Assessment & Plan Note (Signed)
Recommended eating smaller high protein, low fat meals more frequently and exercising 30 mins a day 5 times a week with a goal of 10-15lb weight loss in the next 3 months.  

## 2023-08-10 NOTE — Assessment & Plan Note (Signed)
Chronic.  Followed by Cardiology.  Last CT was done in February 2024.  Continue with statin therapy.  Followed by Cardiology and Vascular surgery.

## 2023-08-10 NOTE — Assessment & Plan Note (Signed)
Chronic.  Controlled.  Last A1c was 6.1%.  Labs ordered at visit today.  Will make recommendations based on lab results.

## 2023-08-11 ENCOUNTER — Telehealth: Payer: Self-pay | Admitting: Nurse Practitioner

## 2023-08-11 LAB — CBC WITH DIFFERENTIAL/PLATELET
Basophils Absolute: 0.1 10*3/uL (ref 0.0–0.2)
Basos: 1 %
EOS (ABSOLUTE): 0.1 10*3/uL (ref 0.0–0.4)
Eos: 2 %
Hematocrit: 39 % (ref 34.0–46.6)
Hemoglobin: 12.3 g/dL (ref 11.1–15.9)
Immature Grans (Abs): 0 10*3/uL (ref 0.0–0.1)
Immature Granulocytes: 0 %
Lymphocytes Absolute: 1.7 10*3/uL (ref 0.7–3.1)
Lymphs: 42 %
MCH: 29.2 pg (ref 26.6–33.0)
MCHC: 31.5 g/dL (ref 31.5–35.7)
MCV: 93 fL (ref 79–97)
Monocytes Absolute: 0.4 10*3/uL (ref 0.1–0.9)
Monocytes: 10 %
Neutrophils Absolute: 1.9 10*3/uL (ref 1.4–7.0)
Neutrophils: 45 %
Platelets: 325 10*3/uL (ref 150–450)
RBC: 4.21 x10E6/uL (ref 3.77–5.28)
RDW: 13.3 % (ref 11.7–15.4)
WBC: 4.1 10*3/uL (ref 3.4–10.8)

## 2023-08-11 LAB — COMPREHENSIVE METABOLIC PANEL
ALT: 20 [IU]/L (ref 0–32)
AST: 19 [IU]/L (ref 0–40)
Albumin: 4 g/dL (ref 3.9–4.9)
Alkaline Phosphatase: 80 [IU]/L (ref 44–121)
BUN/Creatinine Ratio: 8 — ABNORMAL LOW (ref 12–28)
BUN: 6 mg/dL — ABNORMAL LOW (ref 8–27)
Bilirubin Total: 0.4 mg/dL (ref 0.0–1.2)
CO2: 27 mmol/L (ref 20–29)
Calcium: 9.3 mg/dL (ref 8.7–10.3)
Chloride: 103 mmol/L (ref 96–106)
Creatinine, Ser: 0.73 mg/dL (ref 0.57–1.00)
Globulin, Total: 2.4 g/dL (ref 1.5–4.5)
Glucose: 94 mg/dL (ref 70–99)
Potassium: 4.3 mmol/L (ref 3.5–5.2)
Sodium: 141 mmol/L (ref 134–144)
Total Protein: 6.4 g/dL (ref 6.0–8.5)
eGFR: 88 mL/min/{1.73_m2} (ref >59)

## 2023-08-11 LAB — LIPID PANEL
Chol/HDL Ratio: 3 ratio (ref 0.0–4.4)
Cholesterol, Total: 120 mg/dL (ref 100–199)
HDL: 40 mg/dL (ref >39)
LDL Chol Calc (NIH): 68 mg/dL (ref 0–99)
Triglycerides: 55 mg/dL (ref 0–149)
VLDL Cholesterol Cal: 12 mg/dL (ref 5–40)

## 2023-08-11 LAB — HEMOGLOBIN A1C
Est. average glucose Bld gHb Est-mCnc: 120 mg/dL
Hgb A1c MFr Bld: 5.8 % — ABNORMAL HIGH (ref 4.8–5.6)

## 2023-08-11 LAB — TSH: TSH: 1.65 u[IU]/mL (ref 0.450–4.500)

## 2023-08-11 NOTE — Telephone Encounter (Signed)
Pt. Given lab results, verbalizes understanding. 

## 2023-11-27 ENCOUNTER — Ambulatory Visit
Admission: RE | Admit: 2023-11-27 | Discharge: 2023-11-27 | Disposition: A | Discharge status: Home / Self Care | Payer: 59 | Source: Ambulatory Visit | Attending: Nurse Practitioner | Admitting: Nurse Practitioner

## 2023-11-27 DIAGNOSIS — Z1231 Encounter for screening mammogram for malignant neoplasm of breast: Secondary | ICD-10-CM | POA: Diagnosis present

## 2023-12-06 DIAGNOSIS — I771 Stricture of artery: Secondary | ICD-10-CM | POA: Insufficient documentation

## 2023-12-06 NOTE — Progress Notes (Signed)
 MRN : [id number]  Amanda Ochoa is a 73 y.o. (08-Nov-1950) female who presents with chief complaint of check circulation.  History of Present Illness:   The patient returns to the office for surveillance of a known abdominal aortic aneurysm.  In 06/2021 the abdominal aortic measurement of 2.87 cm.  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 No changes suggesting embolic episodes.  No left arm pain or claudication symptoms.  She states her legs are doing better.  There is no history of claudication or rest pain symptoms of the lower extremities.    There have been no interval changes in the patient's overall health care since his last visit.   Patient denies amaurosis fugax or TIA symptoms.   The patient denies angina or shortness of breath.    CT of the chest abdomen and pelvis dated November 17, 2022 is reviewed by me with the patient.  It shows dilatation of the ascending aorta at 4.5 cm.  The previously placed left subclavian stent and thoracic endograft are intact and in excellent position.  No evidence of abdominal aortic aneurysm.  There is ectasia of both common iliac arteries.  There is minimal atherosclerotic changes  Duplex ultrasound of the abdominal aorta demonstrates an infrarenal abdominal aortic aneurysm that measures 3.0 cm.  Essentially unchanged from the previous measurement of 2.9 cm.  No outpatient medications have been marked as taking for the 12/11/23 encounter (Appointment) with Gilda Crease, Latina Craver, MD.    Past Medical History:  Diagnosis Date   Abdominal aneurysm (HCC)    Diverticulitis    GERD (gastroesophageal reflux disease)    Hypertension    Sciatica     Sleep apnea     Past Surgical History:  Procedure Laterality Date   ABDOMINAL HYSTERECTOMY     COLONOSCOPY WITH PROPOFOL N/A 10/19/2021   Procedure: COLONOSCOPY WITH PROPOFOL;  Surgeon: Midge Minium, MD;  Location: [facility] ENDOSCOPY;  Service: Endoscopy;  Laterality: N/A;   CORONARY ANGIOPLASTY WITH STENT PLACEMENT     TOTAL ABDOMINAL HYSTERECTOMY W/ BILATERAL SALPINGOOPHORECTOMY      Social History Social History   Tobacco Use   Smoking status: Never    Passive exposure: Never   Smokeless tobacco: Never  Vaping Use   Vaping status: Never Used  Substance Use Topics   Alcohol use: Never   Drug use: Never    Family History Family History  Problem Relation Age of Onset   Diabetes Mother    Healthy Brother    Diabetes Maternal Grandmother    Heart disease Maternal Grandmother    Healthy Maternal Grandfather    Healthy Daughter    Healthy Son    Breast cancer Neg Hx     Allergies  Allergen Reactions   Penicillins Itching and Rash     REVIEW OF SYSTEMS (Negative unless checked)  Constitutional: [] Weight loss  [] Fever  [] Chills  Cardiac: [] Chest pain   [] Chest pressure   [] Palpitations   [] Shortness of breath when laying flat   [] Shortness of breath with exertion. Vascular:  [x] Pain in legs with walking   [] Pain in legs at rest  [] History of DVT   [] Phlebitis   [] Swelling in legs   [] Varicose veins   [] Non-healing ulcers Pulmonary:   [] Uses home oxygen   [] Productive cough   [] Hemoptysis   [] Wheeze  [] COPD   [] Asthma Neurologic:  [] Dizziness   [] Seizures   [] History of stroke   [] History of TIA  [] Aphasia   [] Vissual changes   [] Weakness or numbness in arm   [] Weakness or numbness in leg Musculoskeletal:   [] Joint swelling   [] Joint pain   [] Low back pain Hematologic:  [] Easy bruising  [] Easy bleeding   [] Hypercoagulable state   [] Anemic Gastrointestinal:  [] Diarrhea   [] Vomiting  [] Gastroesophageal reflux/heartburn   [] Difficulty swallowing. Genitourinary:  [] Chronic  kidney disease   [] Difficult urination  [] Frequent urination   [] Blood in urine Skin:  [] Rashes   [] Ulcers  Psychological:  [] History of anxiety   []  History of major depression.  Physical Examination  There were no vitals filed for this visit. There is no height or weight on file to calculate BMI. Gen: WD/WN, NAD Head: [place]/AT, No temporalis wasting.  Ear/Nose/Throat: Hearing grossly intact, nares w/o erythema or drainage Eyes: PER, EOMI, sclera nonicteric.  Neck: Supple, no masses.  No bruit or JVD.  Pulmonary:  Good air movement, no audible wheezing, no use of accessory muscles.  Cardiac: RRR, normal S1, S2, no Murmurs. Vascular: Radial pulses are equal, no open wounds Vessel Right Left  Radial Palpable Palpable  PT Not Palpable Not Palpable  DP Not Palpable Not Palpable  Gastrointestinal: soft, non-distended. No guarding/no peritoneal signs.  Musculoskeletal: M/S 5/5 throughout.  No visible deformity.  Neurologic: CN 2-12 intact. Pain and light touch intact in extremities.  Symmetrical.  Speech is fluent. Motor exam as listed above. Psychiatric: Judgment intact, Mood & affect appropriate for pt's clinical situation. Dermatologic: No rashes or ulcers noted.  No changes consistent with cellulitis.   CBC Lab Results  Component Value Date   WBC 4.1 08/10/2023   HGB 12.3 08/10/2023   HCT 39.0 08/10/2023   MCV 93 08/10/2023   PLT 325 08/10/2023    BMET    Component Value Date/Time   NA 141 08/10/2023 0916   K 4.3 08/10/2023 0916   CL 103 08/10/2023 0916   CO2 27 08/10/2023 0916   GLUCOSE 94 08/10/2023 0916   BUN 6 (L) 08/10/2023 0916   CREATININE 0.73 08/10/2023 0916   CALCIUM 9.3 08/10/2023 0916   CrCl cannot be calculated (Patient's most recent lab result is older than the maximum 21 days allowed.).  COAG No results found for: "INR", "PROTIME"  Radiology MM 3D SCREENING MAMMOGRAM BILATERAL BREAST Result Date: 11/29/2023 CLINICAL DATA:  Screening. EXAM: DIGITAL  SCREENING BILATERAL MAMMOGRAM WITH TOMOSYNTHESIS AND CAD TECHNIQUE: Bilateral screening digital craniocaudal and mediolateral oblique mammograms were obtained. Bilateral screening digital breast tomosynthesis was performed. The images were evaluated with computer-aided detection. COMPARISON:  Previous exam(s). ACR Breast Density Category a: The breasts are almost entirely fatty. FINDINGS: There are no findings suspicious for malignancy. IMPRESSION: No mammographic evidence of malignancy. A result letter of this screening mammogram will be mailed directly to the patient. RECOMMENDATION: Screening mammogram in one year. (Code:SM-B-01Y) BI-RADS CATEGORY  1: Negative. Electronically Signed   By: Hulan Saas M.D.   On: 11/29/2023  14:54     Assessment/Plan 1. Infrarenal abdominal aortic aneurysm (AAA) without rupture (HCC) (Primary) Recommend: No surgery or intervention is indicated at this time.  The patient has an asymptomatic abdominal aortic aneurysm that is less than 4 cm in maximal diameter.    I have reviewed the natural history of abdominal aortic aneurysm and the small risk of rupture for aneurysm less than 5 cm in size.  However, as these small aneurysms tend to enlarge over time, continued surveillance with ultrasound or CT scan is mandatory.   I have also discussed optimizing medical management with hypertension and lipid control and the negative effect that any tobacco products have on aneurysmal disease.  The patient is also encouraged to exercise a minimum of 30 minutes 4 times a week.   Should the patient develop new onset abdominal or back pain or signs of peripheral embolization they are instructed to seek medical attention immediately and to alert the physician providing care that they have an aneurysm.   The patient voices their understanding.  The patient will return in 12 months with an aortic duplex.  - CT Angio Chest/Abd/Pel for Dissection W and/or W/WO; Future  2.  Thoracic aorta atherosclerosis (HCC) Recommend:  Patient is status post successful endovascular repair of the TAA.   No further intervention is required at this time.   No endoleak is detected and the aneurysm sac is stable.  The patient will continue antiplatelet therapy as prescribed as well as aggressive management of hyperlipidemia. Exercise is encouraged.   However, endografts require continued surveillance with CT scan given the thoracic location. This is mandatory to detect any changes that allow repressurization of the aneurysm sac.  The patient is informed that this would be asymptomatic.  The patient is reminded that lifelong routine surveillance is a necessity with an endograft. Patient will continue to follow-up at the specified interval with ultrasound of the aorta. - CT Angio Chest/Abd/Pel for Dissection W and/or W/WO; Future  3. Subclavian arterial stenosis (HCC)  Recommend:  The patient has evidence of atherosclerosis of the left upper extremities withou status postt claudication stent placement.  The patient does not voice lifestyle limiting changes at this point in time.  Noninvasive studies do not suggest clinically significant change.  No invasive studies, angiography or surgery at this time The patient should continue walking and begin a more formal exercise program.  The patient should continue antiplatelet therapy and aggressive treatment of the lipid abnormalities  No changes in the patient's medications at this time  Continued surveillance is indicated as atherosclerosis is likely to progress with time.    The patient will continue follow up with noninvasive studies as ordered.  - CT Angio Chest/Abd/Pel for Dissection W and/or W/WO; Future  4. Mixed hyperlipidemia Continue statin as ordered and reviewed, no changes at this time  5. Primary hypertension Continue antihypertensive medications as already ordered, these medications have been reviewed and there  are no changes at this time.    Levora Dredge, MD  12/06/2023 11:06 AM

## 2023-12-07 ENCOUNTER — Other Ambulatory Visit (INDEPENDENT_AMBULATORY_CARE_PROVIDER_SITE_OTHER): Payer: Self-pay | Admitting: Vascular Surgery

## 2023-12-07 DIAGNOSIS — I7143 Infrarenal abdominal aortic aneurysm, without rupture: Secondary | ICD-10-CM

## 2023-12-11 ENCOUNTER — Encounter (INDEPENDENT_AMBULATORY_CARE_PROVIDER_SITE_OTHER): Payer: Self-pay | Admitting: Vascular Surgery

## 2023-12-11 ENCOUNTER — Ambulatory Visit (INDEPENDENT_AMBULATORY_CARE_PROVIDER_SITE_OTHER): Payer: 59 | Admitting: Vascular Surgery

## 2023-12-11 ENCOUNTER — Ambulatory Visit (INDEPENDENT_AMBULATORY_CARE_PROVIDER_SITE_OTHER): Payer: 59

## 2023-12-11 VITALS — BP 107/69 | HR 54 | Resp 18 | Ht 64.0 in | Wt 200.2 lb

## 2023-12-11 DIAGNOSIS — I7143 Infrarenal abdominal aortic aneurysm, without rupture: Secondary | ICD-10-CM

## 2023-12-11 DIAGNOSIS — E782 Mixed hyperlipidemia: Secondary | ICD-10-CM

## 2023-12-11 DIAGNOSIS — I700 Atherosclerosis of aorta: Secondary | ICD-10-CM

## 2023-12-11 DIAGNOSIS — I10 Essential (primary) hypertension: Secondary | ICD-10-CM

## 2023-12-11 DIAGNOSIS — I771 Stricture of artery: Secondary | ICD-10-CM | POA: Diagnosis not present

## 2024-02-07 ENCOUNTER — Ambulatory Visit (INDEPENDENT_AMBULATORY_CARE_PROVIDER_SITE_OTHER): Payer: Self-pay | Admitting: Nurse Practitioner

## 2024-02-07 ENCOUNTER — Encounter: Payer: Self-pay | Admitting: Nurse Practitioner

## 2024-02-07 VITALS — BP 105/64 | HR 66 | Ht 64.0 in | Wt 199.0 lb

## 2024-02-07 DIAGNOSIS — I10 Essential (primary) hypertension: Secondary | ICD-10-CM

## 2024-02-07 DIAGNOSIS — R7303 Prediabetes: Secondary | ICD-10-CM

## 2024-02-07 DIAGNOSIS — R21 Rash and other nonspecific skin eruption: Secondary | ICD-10-CM

## 2024-02-07 DIAGNOSIS — I7143 Infrarenal abdominal aortic aneurysm, without rupture: Secondary | ICD-10-CM | POA: Diagnosis not present

## 2024-02-07 DIAGNOSIS — I700 Atherosclerosis of aorta: Secondary | ICD-10-CM

## 2024-02-07 DIAGNOSIS — E782 Mixed hyperlipidemia: Secondary | ICD-10-CM

## 2024-02-07 MED ORDER — AMLODIPINE BESYLATE 10 MG PO TABS: 10.0000 mg | ORAL_TABLET | Freq: Every day | ORAL | 1 refills | Status: DC

## 2024-02-07 MED ORDER — ALENDRONATE SODIUM 70 MG PO TABS: 70.0000 mg | ORAL_TABLET | ORAL | 11 refills | Status: AC

## 2024-02-07 MED ORDER — LOSARTAN POTASSIUM 50 MG PO TABS: 50.0000 mg | ORAL_TABLET | Freq: Every day | ORAL | 1 refills | Status: DC

## 2024-02-07 MED ORDER — CLOPIDOGREL BISULFATE 75 MG PO TABS: 75.0000 mg | ORAL_TABLET | Freq: Every day | ORAL | 1 refills | Status: DC

## 2024-02-07 MED ORDER — ROSUVASTATIN CALCIUM 5 MG PO TABS: 5.0000 mg | ORAL_TABLET | Freq: Every day | ORAL | 1 refills | Status: DC

## 2024-02-07 MED ORDER — METHYLPREDNISOLONE 4 MG PO TBPK: ORAL_TABLET | ORAL | 0 refills | Status: DC

## 2024-02-07 NOTE — Assessment & Plan Note (Signed)
Continue with statin therapy.  Followed by Cardiology and Vascular surgery.  

## 2024-02-07 NOTE — Assessment & Plan Note (Signed)
 Chronic.  Controlled.  Continue with current medication regimen of Amlodipine , Carvedilol  and Losartan .  Refills sent today.  Continue to check blood pressures at home and bring log to next visit.  Labs ordered today.  Return to clinic in 6 months for reevaluation.  Call sooner if concerns arise.

## 2024-02-07 NOTE — Progress Notes (Signed)
 BP 105/64 (BP Location: Left Arm, Patient Position: Sitting, Cuff Size: Large)   Pulse 66   Ht 5\' 4"  (1.626 m)   Wt 199 lb (90.3 kg)   SpO2 96%   BMI 34.16 kg/m    Subjective:    Patient ID: Amanda Ochoa, female    DOB: January 01, 1951, 73 y.o.   MRN: [id number]  HPI: Amanda Ochoa is a 73 y.o. female  Chief Complaint  Patient presents with   Hypertension   Rash    Right side of face   HYPERTENSION without Chronic Kidney Disease Seeing Dr. Junnie Olives regularly.  Last AAA CT was 2/24- recommend semi annual imaging.  Following up with Vascular surgery annually. Hypertension status: controlled  Satisfied with current treatment? yes Duration of hypertension: years BP monitoring frequency:  daily BP range: 118/72 BP medication side effects:  no Medication compliance: excellent compliance Previous BP meds:amlodipine , carvedilol , and losartan  (cozaar ) Aspirin: no Recurrent headaches: no Visual changes: no Palpitations: no Dyspnea: no Chest pain: no Lower extremity edema: no Dizzy/lightheaded: no Also on rosuvastatin  for cholesterol.   Patient states she is having an outbreak of her atopic dermatitis.  She is using OTC creams which are helping but feels like she needs prednisone  to help with the symptoms.  Feels like she is itchy all over.    Relevant past medical, surgical, family and social history reviewed and updated as indicated. Interim medical history since our last visit reviewed. Allergies and medications reviewed and updated.  Review of Systems  Eyes:  Negative for visual disturbance.  Respiratory:  Negative for cough, chest tightness and shortness of breath.   Cardiovascular:  Negative for chest pain, palpitations and leg swelling.  Skin:  Positive for rash.  Neurological:  Negative for dizziness and headaches.    Per HPI unless specifically indicated above     Objective:    BP 105/64 (BP Location: Left Arm, Patient Position: Sitting, Cuff Size: Large)    Pulse 66   Ht 5\' 4"  (1.626 m)   Wt 199 lb (90.3 kg)   SpO2 96%   BMI 34.16 kg/m   Wt Readings from Last 3 Encounters:  02/07/24 199 lb (90.3 kg)  12/11/23 200 lb 3.2 oz (90.8 kg)  08/10/23 199 lb 6.4 oz (90.4 kg)    Physical Exam Vitals and nursing note reviewed.  Constitutional:      General: She is not in acute distress.    Appearance: Normal appearance. She is obese. She is not ill-appearing, toxic-appearing or diaphoretic.  HENT:     Head: Normocephalic.     Right Ear: External ear normal.     Left Ear: External ear normal.     Nose: Nose normal.     Mouth/Throat:     Mouth: Mucous membranes are moist.     Pharynx: Oropharynx is clear.  Eyes:     General:        Right eye: No discharge.        Left eye: No discharge.     Extraocular Movements: Extraocular movements intact.     Conjunctiva/sclera: Conjunctivae normal.     Pupils: Pupils are equal, round, and reactive to light.  Cardiovascular:     Rate and Rhythm: Normal rate and regular rhythm.     Heart sounds: No murmur heard. Pulmonary:     Effort: Pulmonary effort is normal. No respiratory distress.     Breath sounds: Normal breath sounds. No wheezing or rales.  Musculoskeletal:     Cervical  back: Normal range of motion and neck supple.  Skin:    General: Skin is warm and dry.     Capillary Refill: Capillary refill takes less than 2 seconds.  Neurological:     General: No focal deficit present.     Mental Status: She is alert and oriented to person, place, and time. Mental status is at baseline.  Psychiatric:        Mood and Affect: Mood normal.        Behavior: Behavior normal.        Thought Content: Thought content normal.        Judgment: Judgment normal.     Results for orders placed or performed in visit on 08/10/23  Urinalysis, Routine w reflex microscopic   Collection Time: 08/10/23  9:15 AM  Result Value Ref Range   Specific Gravity, UA 1.010 1.005 - 1.030   pH, UA 7.0 5.0 - 7.5   Color, UA  Yellow Yellow   Appearance Ur Clear Clear   Leukocytes,UA Negative Negative   Protein,UA Negative Negative/Trace   Glucose, UA Negative Negative   Ketones, UA Negative Negative   RBC, UA Negative Negative   Bilirubin, UA Negative Negative   Urobilinogen, Ur 0.2 0.2 - 1.0 mg/dL   Nitrite, UA Negative Negative   Microscopic Examination Comment   CBC with Differential/Platelet   Collection Time: 08/10/23  9:16 AM  Result Value Ref Range   WBC 4.1 3.4 - 10.8 x10E3/uL   RBC 4.21 3.77 - 5.28 x10E6/uL   Hemoglobin 12.3 11.1 - 15.9 g/dL   Hematocrit 16.1 09.6 - 46.6 %   MCV 93 79 - 97 fL   MCH 29.2 26.6 - 33.0 pg   MCHC 31.5 31.5 - 35.7 g/dL   RDW 04.5 40.9 - 81.1 %   Platelets 325 150 - 450 x10E3/uL   Neutrophils 45 Not Estab. %   Lymphs 42 Not Estab. %   Monocytes 10 Not Estab. %   Eos 2 Not Estab. %   Basos 1 Not Estab. %   Neutrophils Absolute 1.9 1.4 - 7.0 x10E3/uL   Lymphocytes Absolute 1.7 0.7 - 3.1 x10E3/uL   Monocytes Absolute 0.4 0.1 - 0.9 x10E3/uL   EOS (ABSOLUTE) 0.1 0.0 - 0.4 x10E3/uL   Basophils Absolute 0.1 0.0 - 0.2 x10E3/uL   Immature Granulocytes 0 Not Estab. %   Immature Grans (Abs) 0.0 0.0 - 0.1 x10E3/uL  Comprehensive metabolic panel   Collection Time: 08/10/23  9:16 AM  Result Value Ref Range   Glucose 94 70 - 99 mg/dL   BUN 6 (L) 8 - 27 mg/dL   Creatinine, Ser 9.14 0.57 - 1.00 mg/dL   eGFR 88 >78 GN/FAO/1.30   BUN/Creatinine Ratio 8 (L) 12 - 28   Sodium 141 134 - 144 mmol/L   Potassium 4.3 3.5 - 5.2 mmol/L   Chloride 103 96 - 106 mmol/L   CO2 27 20 - 29 mmol/L   Calcium  9.3 8.7 - 10.3 mg/dL   Total Protein 6.4 6.0 - 8.5 g/dL   Albumin 4.0 3.9 - 4.9 g/dL   Globulin, Total 2.4 1.5 - 4.5 g/dL   Bilirubin Total 0.4 0.0 - 1.2 mg/dL   Alkaline Phosphatase 80 44 - 121 IU/L   AST 19 0 - 40 IU/L   ALT 20 0 - 32 IU/L  Lipid panel   Collection Time: 08/10/23  9:16 AM  Result Value Ref Range   Cholesterol, Total 120 100 - 199 mg/dL  Triglycerides 55 0 -  149 mg/dL   HDL 40 >78 mg/dL   VLDL Cholesterol Cal 12 5 - 40 mg/dL   LDL Chol Calc (NIH) 68 0 - 99 mg/dL   Chol/HDL Ratio 3.0 0.0 - 4.4 ratio  TSH   Collection Time: 08/10/23  9:16 AM  Result Value Ref Range   TSH 1.650 0.450 - 4.500 uIU/mL  HgB A1c   Collection Time: 08/10/23  9:16 AM  Result Value Ref Range   Hgb A1c MFr Bld 5.8 (H) 4.8 - 5.6 %   Est. average glucose Bld gHb Est-mCnc 120 mg/dL      Assessment & Plan:   Problem List Items Addressed This Visit       Cardiovascular and Mediastinum   Hypertension (Chronic)   Chronic.  Controlled.  Continue with current medication regimen of Amlodipine , Carvedilol  and Losartan .  Refills sent today.  Continue to check blood pressures at home and bring log to next visit.  Labs ordered today.  Return to clinic in 6 months for reevaluation.  Call sooner if concerns arise.       Relevant Medications   amLODipine  (NORVASC ) 10 MG tablet   losartan  (COZAAR ) 50 MG tablet   rosuvastatin  (CRESTOR ) 5 MG tablet   Other Relevant Orders   Comprehensive metabolic panel with GFR   Thoracic aorta atherosclerosis (HCC) (Chronic)   Continue with statin therapy.  Followed by Cardiology and Vascular surgery.       Relevant Medications   amLODipine  (NORVASC ) 10 MG tablet   losartan  (COZAAR ) 50 MG tablet   rosuvastatin  (CRESTOR ) 5 MG tablet   AAA (abdominal aortic aneurysm) without rupture (HCC) - Primary (Chronic)   Chronic.  Followed by Cardiology and vascular surgery.  Last CT was done in February 2024.  Saw Vascular surgery in March- no intervention at this time.  Notes were reviewed.  Continue with statin therapy.  Followed by Cardiology and Vascular surgery.       Relevant Medications   amLODipine  (NORVASC ) 10 MG tablet   losartan  (COZAAR ) 50 MG tablet   rosuvastatin  (CRESTOR ) 5 MG tablet     Other   Prediabetes (Chronic)   Labs ordered at visit today.  Will make recommendations based on lab results.        Relevant Orders    Hemoglobin A1c   Mixed hyperlipidemia (Chronic)   Chronic.  Controlled.  Continue with current medication regimen of Crestor  20mg  daily.  Refills sent today.  Labs ordered today.  Return to clinic in 6 months for reevaluation.  Call sooner if concerns arise.       Relevant Medications   amLODipine  (NORVASC ) 10 MG tablet   losartan  (COZAAR ) 50 MG tablet   rosuvastatin  (CRESTOR ) 5 MG tablet   Other Relevant Orders   Lipid panel   Severe obesity (BMI 35.0-39.9) with comorbidity (HCC) (Chronic)   Recommended eating smaller high protein, low fat meals more frequently and exercising 30 mins a day 5 times a week with a goal of 10-15lb weight loss in the next 3 months.        Other Visit Diagnoses       Rash       Will treat with medrol dose pak.  Complete course of medication.  Follow up if not improved.        Follow up plan: Return in about 6 months (around 08/09/2024) for Physical and Fasting labs.

## 2024-02-07 NOTE — Assessment & Plan Note (Signed)
Chronic.  Controlled.  Continue with current medication regimen of Crestor 20mg  daily.  Refills sent today.  Labs ordered today.  Return to clinic in 6 months for reevaluation.  Call sooner if concerns arise.

## 2024-02-07 NOTE — Assessment & Plan Note (Signed)
 Recommended eating smaller high protein, low fat meals more frequently and exercising 30 mins a day 5 times a week with a goal of 10-15lb weight loss in the next 3 months.

## 2024-02-07 NOTE — Assessment & Plan Note (Signed)
 Labs ordered at visit today.  Will make recommendations based on lab results.

## 2024-02-07 NOTE — Assessment & Plan Note (Signed)
 Chronic.  Followed by Cardiology and vascular surgery.  Last CT was done in February 2024.  Saw Vascular surgery in March- no intervention at this time.  Notes were reviewed.  Continue with statin therapy.  Followed by Cardiology and Vascular surgery.

## 2024-02-08 ENCOUNTER — Encounter: Payer: Self-pay | Admitting: Nurse Practitioner

## 2024-02-08 LAB — LIPID PANEL
Chol/HDL Ratio: 2.4 ratio (ref 0.0–4.4)
Cholesterol, Total: 107 mg/dL (ref 100–199)
HDL: 44 mg/dL (ref >39)
LDL Chol Calc (NIH): 49 mg/dL (ref 0–99)
Triglycerides: 64 mg/dL (ref 0–149)
VLDL Cholesterol Cal: 14 mg/dL (ref 5–40)

## 2024-02-08 LAB — COMPREHENSIVE METABOLIC PANEL WITH GFR
ALT: 12 IU/L (ref 0–32)
AST: 15 IU/L (ref 0–40)
Albumin: 4 g/dL (ref 3.8–4.8)
Alkaline Phosphatase: 79 IU/L (ref 44–121)
BUN/Creatinine Ratio: 15 (ref 12–28)
BUN: 13 mg/dL (ref 8–27)
Bilirubin Total: 0.4 mg/dL (ref 0.0–1.2)
CO2: 25 mmol/L (ref 20–29)
Calcium: 9 mg/dL (ref 8.7–10.3)
Chloride: 106 mmol/L (ref 96–106)
Creatinine, Ser: 0.89 mg/dL (ref 0.57–1.00)
Globulin, Total: 2.1 g/dL (ref 1.5–4.5)
Glucose: 96 mg/dL (ref 70–99)
Potassium: 3.9 mmol/L (ref 3.5–5.2)
Sodium: 143 mmol/L (ref 134–144)
Total Protein: 6.1 g/dL (ref 6.0–8.5)
eGFR: 69 mL/min/{1.73_m2} (ref >59)

## 2024-02-08 LAB — HEMOGLOBIN A1C
Est. average glucose Bld gHb Est-mCnc: 120 mg/dL
Hgb A1c MFr Bld: 5.8 % — ABNORMAL HIGH (ref 4.8–5.6)

## 2024-02-20 ENCOUNTER — Encounter (INDEPENDENT_AMBULATORY_CARE_PROVIDER_SITE_OTHER): Payer: Self-pay

## 2024-03-06 ENCOUNTER — Encounter: Payer: Self-pay | Admitting: Cardiology

## 2024-03-06 ENCOUNTER — Ambulatory Visit: Attending: Cardiology | Admitting: Cardiology

## 2024-03-06 VITALS — BP 98/63 | HR 54 | Ht 64.0 in | Wt 197.4 lb

## 2024-03-06 DIAGNOSIS — I77810 Thoracic aortic ectasia: Secondary | ICD-10-CM

## 2024-03-06 DIAGNOSIS — I719 Aortic aneurysm of unspecified site, without rupture: Secondary | ICD-10-CM

## 2024-03-06 DIAGNOSIS — I10 Essential (primary) hypertension: Secondary | ICD-10-CM

## 2024-03-06 MED ORDER — AMLODIPINE BESYLATE 5 MG PO TABS
5.0000 mg | ORAL_TABLET | Freq: Every day | ORAL | 3 refills | Status: AC
Start: 2024-03-06 — End: ?

## 2024-03-06 NOTE — Progress Notes (Signed)
 Cardiology Office Note:    Date:  03/06/2024   ID:  Amanda Ochoa, DOB 07-29-1951, MRN [id number]  PCP:  Aileen Alexanders, NP   [facility] Providers Cardiologist:  Constancia Delton, MD     Referring MD: Aileen Alexanders, NP   Chief Complaint  Patient presents with   Follow-up    6 month follow up. Patient states that she has been stressed out a lot. Meds reviewed.     History of Present Illness:    Amanda Ochoa is a 73 y.o. 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  Doing okay, denies chest pain or shortness of breath.  Has been stressed lately due to level who is smoking and can smell the smoke fumes coming to her apartment.  Denies dizziness, presyncope or syncope.  Compliant with medications as prescribed.  Has no new concerns at this time.  Prior notes/testing Echo 10/2022 EF 50 to 55%, aortic root dilatation 45 mm,   Past Medical History:  Diagnosis Date   Abdominal aneurysm (HCC)    Diverticulitis    GERD (gastroesophageal reflux disease)    Hypertension    Sciatica    Sleep apnea     Past Surgical History:  Procedure Laterality Date   ABDOMINAL HYSTERECTOMY     COLONOSCOPY WITH PROPOFOL  N/A 10/19/2021   Procedure: COLONOSCOPY WITH PROPOFOL ;  Surgeon: Marnee Sink, MD;  Location: [facility];  Service: Endoscopy;  Laterality: N/A;   CORONARY ANGIOPLASTY WITH STENT PLACEMENT     TOTAL ABDOMINAL HYSTERECTOMY W/ BILATERAL SALPINGOOPHORECTOMY      Current Medications: Current Meds  Medication Sig   alendronate  (FOSAMAX ) 70 MG tablet Take 1 tablet (70 mg total) by mouth every 7 (seven) days. Take with a full glass of water on an empty stomach.   Apoaequorin (PREVAGEN PO) Take by mouth  daily. Unable to verify dosage   cholecalciferol (VITAMIN D3) 25 MCG (1000 UNIT) tablet Take 1,000 Units by mouth daily.   clopidogrel  (PLAVIX ) 75 MG tablet Take 1 tablet (75 mg total) by mouth daily.   losartan  (COZAAR ) 50 MG tablet Take 1 tablet (50 mg total) by mouth daily.   Multiple Minerals-Vitamins (CALCIUM -MAGNESIUM-ZINC-D3) TABS Take 1 tablet by mouth daily.   Multiple Vitamins-Minerals (WOMENS MULTI GUMMIES PO) Take by mouth.   rosuvastatin  (CRESTOR ) 5 MG tablet Take 1 tablet (5 mg total) by mouth daily.   tobramycin (TOBREX) 0.3 % ophthalmic solution Place 1 drop into the right eye 4 (four) times daily.   [DISCONTINUED] amLODipine  (NORVASC ) 10 MG tablet Take 1 tablet (10 mg total) by mouth daily.     Allergies:   Penicillins   Social History   Socioeconomic History   Marital status: Single    Spouse name: Not on file   Number of children: Not on file   Years of education: Not on file   Highest education level: Not on file  Occupational History   Not on file  Tobacco Use   Smoking status: Never    Passive exposure: Never   Smokeless tobacco: Never  Vaping Use   Vaping status: Never Used  Substance and Sexual Activity   Alcohol use: Never   Drug use: Never   Sexual activity: Not Currently  Other Topics Concern   Not on file  Social History Narrative   Not on file   Social Drivers of Health   Financial Resource Strain: Low Risk  (03/27/2023)   Overall Financial Resource Strain (CARDIA)    Difficulty of Paying Living Expenses: Not hard at all  Food Insecurity: No Food Insecurity (03/27/2023)   Hunger Vital Sign    Worried About Running Out of Food in the Last Year: Never true    Ran Out of Food in the Last Year: Never true  Transportation Needs: No Transportation Needs (03/27/2023)   PRAPARE - Administrator, Civil Service (Medical): No    Lack of Transportation (Non-Medical): No  Physical Activity: Insufficiently Active (03/27/2023)   Exercise Vital  Sign    Days of Exercise per Week: 3 days    Minutes of Exercise per Session: 30 min  Stress: No Stress Concern Present (03/27/2023)   [employer] of Occupational Health - Occupational Stress Questionnaire    Feeling of Stress : Only a little  Social Connections: Moderately Isolated (03/27/2023)   Social Connection and Isolation Panel [NHANES]    Frequency of Communication with Friends and Family: More than three times a week    Frequency of Social Gatherings with Friends and Family: Twice a week    Attends Religious Services: Never    Database administrator or Organizations: Yes    Attends Engineer, structural: 1 to 4 times per year    Marital Status: Separated     Family History: The patient's family history includes Diabetes in her maternal grandmother and mother; Healthy in her brother, daughter, maternal grandfather, and son; Heart disease in her maternal grandmother. There is no history of Breast cancer.  ROS:   Please see the history of present illness.     All other systems reviewed and are negative.  EKGs/Labs/Other Studies Reviewed:    The following studies were reviewed today:   EKG Interpretation Date/Time:  Wednesday March 06 2024 08:34:52 EDT Ventricular Rate:  54 PR Interval:  196 QRS Duration:  106 QT Interval:  476 QTC Calculation: 451 R Axis:   -41  Text Interpretation: Sinus bradycardia Left axis deviation Moderate voltage criteria for LVH, may be normal variant ( R in aVL , Cornell product ) ST & T wave abnormality, consider anterior ischemia Confirmed by Constancia Delton ([id number]) on 03/06/2024 8:48:54 AM    Recent Labs: 08/10/2023: Hemoglobin 12.3; Platelets 325; TSH 1.650 02/07/2024: ALT 12; BUN 13; Creatinine, Ser 0.89; Potassium 3.9; Sodium 143  Recent Lipid Panel    Component Value Date/Time   CHOL 107 02/07/2024 0918   TRIG 64 02/07/2024 0918   HDL 44 02/07/2024 0918   CHOLHDL 2.4 02/07/2024 0918   LDLCALC 49 02/07/2024 0918      Risk Assessment/Calculations:             Physical Exam:    VS:  BP 98/63   Pulse (!) 54   Ht 5\' 4"  (1.626 m)   Wt 197 lb 6.4 oz (89.5 kg)   SpO2 97%   BMI 33.88 kg/m     Wt Readings from Last 3 Encounters:  03/06/24 197 lb 6.4 oz (89.5 kg)  02/07/24 199 lb (90.3 kg)  12/11/23 200 lb 3.2 oz (90.8 kg)     GEN:  Well nourished, well developed in no acute distress HEENT: Normal NECK: No JVD; No carotid bruits CARDIAC: RRR, no murmurs, rubs, gallops RESPIRATORY:  Clear to auscultation without rales, wheezing or rhonchi  ABDOMEN: Soft, non-tender,  non-distended MUSCULOSKELETAL:  No edema; No deformity  SKIN: Warm and dry NEUROLOGIC:  Alert and oriented x 3 PSYCHIATRIC:  Normal affect   ASSESSMENT:    1. Aortic root dilatation (HCC)   2. Primary hypertension   3. Penetrating atherosclerotic ulcer of aorta (HCC)    PLAN:    In order of problems listed above:  Mild to moderate aortic root dilatation 45 mm.  Normal EF 50%.  Continue BP meds.  Evaluate with serial monitoring VS CT scan as per vascular surgery due to descending endograft placement. Hypertension, BP low normal.  Reduce Norvasc  to 5 mg daily,  Continue Coreg  12.5 mg twice daily, losartan  25 mg daily. Penetrating ulcer s/p aortic arch-proximal descending aorta graft placement (2020).  Being followed and monitored/managed by vascular surgery.  Continue Plavix , statin.    Follow-up in 6 months.    Medication Adjustments/Labs and Tests Ordered: Current medicines are reviewed at length with the patient today.  Concerns regarding medicines are outlined above.  Orders Placed This Encounter  Procedures   EKG 12-Lead   Meds ordered this encounter  Medications   amLODipine  (NORVASC ) 5 MG tablet    Sig: Take 1 tablet (5 mg total) by mouth daily.    Dispense:  30 tablet    Refill:  3    Patient Instructions  Medication Instructions:  Your physician recommends the following medication  changes.  DECREASE: Amlodipine  (Norvasc ) to 5 mg daily  *If you need a refill on your cardiac medications before your next appointment, please call your pharmacy*  Lab Work: No labs ordered today  If you have labs (blood work) drawn today and your tests are completely normal, you will receive your results only by: MyChart Message (if you have MyChart) OR A paper copy in the mail If you have any lab test that is abnormal or we need to change your treatment, we will call you to review the results.  Testing/Procedures: No test ordered today   Follow-Up: At [facility], you and your health needs are our priority.  As part of our continuing mission to provide you with exceptional heart care, our providers are all part of one team.  This team includes your primary Cardiologist (physician) and Advanced Practice Providers or APPs (Physician Assistants and Nurse Practitioners) who all work together to provide you with the care you need, when you need it.  Your next appointment:   6 month(s)  Provider:   You may see Constancia Delton, MD or one of the following Advanced Practice Providers on your designated Care Team:   Laneta Pintos, NP Gildardo Labrador, PA-C Varney Gentleman, PA-C Cadence Sanger, PA-C Ronald Cockayne, NP Morey Ar, NP    We recommend signing up for the patient portal called "MyChart".  Sign up information is provided on this After Visit Summary.  MyChart is used to connect with patients for Virtual Visits (Telemedicine).  Patients are able to view lab/test results, encounter notes, upcoming appointments, etc.  Non-urgent messages can be sent to your provider as well.   To learn more about what you can do with MyChart, go to [email or web address].     Signed, Constancia Delton, MD  03/06/2024 10:01 AM    [facility]

## 2024-03-06 NOTE — Patient Instructions (Signed)
 Medication Instructions:  Your physician recommends the following medication changes.  DECREASE: Amlodipine  (Norvasc ) to 5 mg daily  *If you need a refill on your cardiac medications before your next appointment, please call your pharmacy*  Lab Work: No labs ordered today  If you have labs (blood work) drawn today and your tests are completely normal, you will receive your results only by: MyChart Message (if you have MyChart) OR A paper copy in the mail If you have any lab test that is abnormal or we need to change your treatment, we will call you to review the results.  Testing/Procedures: No test ordered today   Follow-Up: At [facility], you and your health needs are our priority.  As part of our continuing mission to provide you with exceptional heart care, our providers are all part of one team.  This team includes your primary Cardiologist (physician) and Advanced Practice Providers or APPs (Physician Assistants and Nurse Practitioners) who all work together to provide you with the care you need, when you need it.  Your next appointment:   6 month(s)  Provider:   You may see Constancia Delton, MD or one of the following Advanced Practice Providers on your designated Care Team:   Laneta Pintos, NP Gildardo Labrador, PA-C Varney Gentleman, PA-C Cadence Zoar, PA-C Ronald Cockayne, NP Morey Ar, NP    We recommend signing up for the patient portal called "MyChart".  Sign up information is provided on this After Visit Summary.  MyChart is used to connect with patients for Virtual Visits (Telemedicine).  Patients are able to view lab/test results, encounter notes, upcoming appointments, etc.  Non-urgent messages can be sent to your provider as well.   To learn more about what you can do with MyChart, go to [email or web address].

## 2024-04-02 ENCOUNTER — Ambulatory Visit: Payer: Self-pay | Admitting: Emergency Medicine

## 2024-04-02 VITALS — BP 100/60 | Ht 64.0 in | Wt 197.0 lb

## 2024-04-02 DIAGNOSIS — Z0000 Encounter for general adult medical examination without abnormal findings: Secondary | ICD-10-CM | POA: Diagnosis not present

## 2024-04-02 NOTE — Progress Notes (Signed)
 Subjective:   Amanda Ochoa is a 73 y.o. who presents for a Medicare Wellness preventive visit.  As a reminder, Annual Wellness Visits don't include a physical exam, and some assessments may be limited, especially if this visit is performed virtually. We may recommend an in-person follow-up visit with your provider if needed.  Visit Complete: In person  Persons Participating in Visit: Patient.  AWV Questionnaire: No: Patient Medicare AWV questionnaire was not completed prior to this visit.  Cardiac Risk Factors include: advanced age (>74men, >21 women);dyslipidemia;hypertension;obesity (BMI >30kg/m2);Other (see comment), Risk factor comments: prediabetic     Objective:    Today's Vitals   04/02/24 1123  BP: 100/60  Weight: 197 lb (89.4 kg)  Height: 5' 4 (1.626 m)   Body mass index is 33.81 kg/m.     04/02/2024   11:40 AM 03/27/2023    9:13 AM 03/15/2022    9:11 AM 10/19/2021    8:51 AM  Advanced Directives  Does Patient Have a Medical Advance Directive? No No No No  Would patient like information on creating a medical advance directive? Yes (MAU/Ambulatory/Procedural Areas - Information given) No - Patient declined No - Patient declined     Current Medications (verified) Outpatient Encounter Medications as of 04/02/2024  Medication Sig   alendronate  (FOSAMAX ) 70 MG tablet Take 1 tablet (70 mg total) by mouth every 7 (seven) days. Take with a full glass of water on an empty stomach.   amLODipine  (NORVASC ) 5 MG tablet Take 1 tablet (5 mg total) by mouth daily.   Apoaequorin (PREVAGEN PO) Take by mouth daily. Unable to verify dosage   carvedilol  (COREG ) 12.5 MG tablet Take 1 tablet (12.5 mg total) by mouth 2 (two) times daily with a meal.   cholecalciferol (VITAMIN D3) 25 MCG (1000 UNIT) tablet Take 1,000 Units by mouth daily.   clopidogrel  (PLAVIX ) 75 MG tablet Take 1 tablet (75 mg total) by mouth daily.   losartan  (COZAAR ) 50 MG tablet Take 1 tablet (50 mg total) by mouth  daily.   Multiple Minerals-Vitamins (CALCIUM -MAGNESIUM-ZINC-D3) TABS Take 1 tablet by mouth daily.   rosuvastatin  (CRESTOR ) 5 MG tablet Take 1 tablet (5 mg total) by mouth daily.   tobramycin (TOBREX) 0.3 % ophthalmic solution Place 1 drop into the right eye 4 (four) times daily.   VITAMIN A PO Take 1 tablet by mouth daily.   methylPREDNISolone  (MEDROL  DOSEPAK) 4 MG TBPK tablet Take as directed (Patient not taking: Reported on 04/02/2024)   Multiple Vitamins-Minerals (WOMENS MULTI GUMMIES PO) Take by mouth. (Patient not taking: Reported on 04/02/2024)   No facility-administered encounter medications on file as of 04/02/2024.    Allergies (verified) Penicillins   History: Past Medical History:  Diagnosis Date   Abdominal aneurysm (HCC)    Diverticulitis    GERD (gastroesophageal reflux disease)    Hypertension    Sciatica    Sleep apnea    Past Surgical History:  Procedure Laterality Date   ABDOMINAL HYSTERECTOMY     COLONOSCOPY WITH PROPOFOL  N/A 10/19/2021   Procedure: COLONOSCOPY WITH PROPOFOL ;  Surgeon: Jinny Carmine, MD;  Location: [facility] ENDOSCOPY;  Service: Endoscopy;  Laterality: N/A;   CORONARY ANGIOPLASTY WITH STENT PLACEMENT     TOTAL ABDOMINAL HYSTERECTOMY W/ BILATERAL SALPINGOOPHORECTOMY     Family History  Problem Relation Age of Onset   Diabetes Mother    Other Father        unknown medical history   Healthy Brother    Healthy Daughter  Healthy Son    Diabetes Maternal Grandmother    Heart disease Maternal Grandmother    Healthy Maternal Grandfather    Breast cancer Neg Hx    Social History   Socioeconomic History   Marital status: Divorced    Spouse name: Not on file   Number of children: 3   Years of education: Not on file   Highest education level: Not on file  Occupational History   Occupation: retired  Tobacco Use   Smoking status: Never    Passive exposure: Never   Smokeless tobacco: Never  Vaping Use   Vaping status: Never Used  Substance and  Sexual Activity   Alcohol use: Never   Drug use: Never   Sexual activity: Not Currently  Other Topics Concern   Not on file  Social History Narrative   Not on file   Social Drivers of Health   Financial Resource Strain: Low Risk  (04/02/2024)   Overall Financial Resource Strain (CARDIA)    Difficulty of Paying Living Expenses: Not hard at all  Food Insecurity: No Food Insecurity (04/02/2024)   Hunger Vital Sign    Worried About Running Out of Food in the Last Year: Never true    Ran Out of Food in the Last Year: Never true  Transportation Needs: No Transportation Needs (04/02/2024)   PRAPARE - Administrator, Civil Service (Medical): No    Lack of Transportation (Non-Medical): No  Physical Activity: Inactive (04/02/2024)   Exercise Vital Sign    Days of Exercise per Week: 0 days    Minutes of Exercise per Session: 0 min  Stress: No Stress Concern Present (04/02/2024)   [employer] of Occupational Health - Occupational Stress Questionnaire    Feeling of Stress: Only a little  Social Connections: Socially Isolated (04/02/2024)   Social Connection and Isolation Panel    Frequency of Communication with Friends and Family: More than three times a week    Frequency of Social Gatherings with Friends and Family: More than three times a week    Attends Religious Services: Never    Database administrator or Organizations: No    Attends Engineer, structural: Never    Marital Status: Divorced    Tobacco Counseling Counseling given: Not Answered    Clinical Intake:  Pre-visit preparation completed: Yes  Pain : No/denies pain     BMI - recorded: 33.81 Nutritional Status: BMI > 30  Obese Nutritional Risks: None Diabetes: No  Lab Results  Component Value Date   HGBA1C 5.8 (H) 02/07/2024   HGBA1C 5.8 (H) 08/10/2023   HGBA1C 6.1 (H) 02/06/2023     How often do you need to have someone help you when you read instructions, pamphlets, or other written  materials from your doctor or pharmacy?: 1 - Never  Interpreter Needed?: No  Information entered by :: Vina Ned, CMA   Activities of Daily Living     04/02/2024   11:26 AM  In your present state of health, do you have any difficulty performing the following activities:  Hearing? 0  Vision? 0  Difficulty concentrating or making decisions? 0  Walking or climbing stairs? 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 0  Doing errands, shopping? 0  Preparing Food and eating ? N  Using the Toilet? N  In the past six months, have you accidently leaked urine? N  Do you have problems with loss of bowel control? N  Managing your Medications? N  Managing your Finances? N  Housekeeping or managing your Housekeeping? N    Patient Care Team: Melvin Pao, NP as PCP - General (Nurse Practitioner) Darliss Rogue, MD as PCP - Cardiology (Cardiology) Trevor Haskell, [place] (Optometry) Schnier, Cordella MATSU, MD (Vascular Surgery) Jarold Mayo, MD as Consulting Physician (Ophthalmology)  I have updated your Care Teams any recent Medical Services you may have received from other providers in the past year.     Assessment:   This is a routine wellness examination for Lonia.  Hearing/Vision screen Hearing Screening - Comments:: Denies hearing difficulty Vision Screening - Comments:: Gets routine eye exams, Dr. Haskell Trevor, [facility] [place]   Goals Addressed             This Visit's Progress    Patient Stated       Lose 15 to 20 lbs     COMPLETED: Weight (lb) < 200 lb (90.7 kg)   197 lb (89.4 kg)      Depression Screen     04/02/2024   11:38 AM 02/07/2024    8:51 AM 08/10/2023    9:00 AM 03/27/2023    9:05 AM 02/06/2023    9:44 AM 08/08/2022    9:25 AM 03/15/2022    9:11 AM  PHQ 2/9 Scores  PHQ - 2 Score 0 0 0 0 0 0 0  PHQ- 9 Score 0 0 0 0 0 0 0    Fall Risk     04/02/2024   11:43 AM 03/27/2023    9:19 AM 02/06/2023    9:43 AM 08/08/2022    9:25 AM  03/15/2022    9:18 AM  Fall Risk   Falls in the past year? 0 0 0 0 1  Number falls in past yr: 0 0 0 0 0  Injury with Fall? 0 0 0 0 0  Risk for fall due to : No Fall Risks No Fall Risks No Fall Risks No Fall Risks   Follow up Falls evaluation completed Falls prevention discussed Falls evaluation completed Falls evaluation completed  Falls evaluation completed;Education provided;Falls prevention discussed      Data saved with a previous flowsheet row definition    MEDICARE RISK AT HOME:  Medicare Risk at Home Any stairs in or around the home?: No If so, are there any without handrails?: No Home free of loose throw rugs in walkways, pet beds, electrical cords, etc?: Yes Adequate lighting in your home to reduce risk of falls?: Yes Life alert?: No Use of a cane, walker or w/c?: No Grab bars in the bathroom?: Yes Shower chair or bench in shower?: Yes Elevated toilet seat or a handicapped toilet?: No  TIMED UP AND GO:  Was the test performed?  Yes  Length of time to ambulate 10 feet: 5 sec Gait steady and fast without use of assistive device  Cognitive Function: 6CIT completed        04/02/2024   11:44 AM 03/27/2023    9:24 AM 03/15/2022    9:01 AM  6CIT Screen  What Year? 0 points 0 points 0 points  What month? 0 points 0 points 0 points  What time? 0 points 0 points 0 points  Count back from 20 0 points 0 points 0 points  Months in reverse 0 points 0 points 0 points  Repeat phrase 0 points 0 points 0 points  Total Score 0 points 0 points 0 points    Immunizations Immunization History  Administered Date(s) Administered   Fluad Quad(high Dose 65+) 06/14/2021, 06/20/2022, 07/06/2022   Hepatitis B 08/30/2017, 10/01/2017, 04/06/2018   Influenza, High Dose Seasonal PF 06/21/2023   Influenza,inj,Quad PF,6+ Mos 06/16/2023   Influenza-Unspecified 10/08/2015, 06/30/2016, 08/30/2017, 07/21/2018, 06/02/2019   Moderna Covid-19 Fall Seasonal Vaccine 38yrs & older 06/27/2022,  06/21/2023   Moderna Covid-19 Vaccine Bivalent Booster 17yrs & up 08/06/2021   Moderna Sars-Covid-2 Vaccination 01/03/2020, 02/03/2020, 10/24/2020, 06/09/2023   PNEUMOCOCCAL CONJUGATE-20 06/27/2022   PPD Test 04/06/2021   Pneumococcal Conjugate-13 05/01/2014, 10/19/2017   Pneumococcal Polysaccharide-23 09/16/2019, 12/17/2019   Respiratory Syncytial Virus Vaccine,Recomb Aduvanted(Arexvy) 06/20/2022   Tdap 05/01/2014, 05/04/2016, 10/01/2017, 07/08/2022   Zoster Recombinant(Shingrix) 09/16/2019, 12/17/2019, 07/08/2022, 09/06/2022   Zoster, Live 05/01/2014    Screening Tests Health Maintenance  Topic Date Due   COVID-19 Vaccine (7 - 2024-25 season) 08/16/2023   INFLUENZA VACCINE  05/03/2024   MAMMOGRAM  11/26/2024   Medicare Annual Wellness (AWV)  04/02/2025   DEXA SCAN  07/03/2028   Colonoscopy  10/19/2028   DTaP/Tdap/Td (5 - Td or Tdap) 07/08/2032   Pneumococcal Vaccine: 50+ Years  Completed   Hepatitis B Vaccines  Completed   Hepatitis C Screening  Completed   Zoster Vaccines- Shingrix  Completed   HPV VACCINES  Aged Out   Meningococcal B Vaccine  Aged Out    Health Maintenance  Health Maintenance Due  Topic Date Due   COVID-19 Vaccine (7 - 2024-25 season) 08/16/2023   Health Maintenance Items Addressed: See Nurse Notes at the end of this note  Additional Screening:  Vision Screening: Recommended annual ophthalmology exams for early detection of glaucoma and other disorders of the eye. Would you like a referral to an eye doctor? No    Dental Screening: Recommended annual dental exams for proper oral hygiene  Community Resource Referral / Chronic Care Management: CRR required this visit?  No   CCM required this visit?  No   Plan:    I have personally reviewed and noted the following in the patient's chart:   Medical and social history Use of alcohol, tobacco or illicit drugs  Current medications and supplements including opioid prescriptions. Patient is not  currently taking opioid prescriptions. Functional ability and status Nutritional status Physical activity Advanced directives List of other physicians Hospitalizations, surgeries, and ER visits in previous 12 months Vitals Screenings to include cognitive, depression, and falls Referrals and appointments  In addition, I have reviewed and discussed with patient certain preventive protocols, quality metrics, and best practice recommendations. A written personalized care plan for preventive services as well as general preventive health recommendations were provided to patient.   Vina Ned, CMA   04/02/2024   After Visit Summary: (In Person-Printed) AVS printed and given to the patient  Notes:  Covid and flu vaccines in the fall

## 2024-04-02 NOTE — Patient Instructions (Signed)
 [name] , Thank you for taking time out of your busy schedule to complete your Annual Wellness Visit with me. I enjoyed our conversation and look forward to speaking with you again next year. I, as well as your care team,  appreciate your ongoing commitment to your health goals. Please review the following plan we discussed and let me know if I can assist you in the future. Your Game plan/ To Do List    Referrals: None   Follow up Visits: Next Medicare AWV with our clinical staff: 04/08/25 @ 11:20am (IN PERSON VISIT)   Have you seen your provider in the last 6 months (3 months if uncontrolled diabetes)? Yes Next Office Visit with your provider: 08/12/24 @ 10:00am with Amanda Petty, NP  Clinician Recommendations:  Aim for 30 minutes of exercise or brisk walking, 6-8 glasses of water, and 5 servings of fruits and vegetables each day.       This is a list of the screening recommended for you and due dates:  Health Maintenance  Topic Date Due   COVID-19 Vaccine (7 - 2024-25 season) 08/16/2023   Flu Shot  05/03/2024   Mammogram  11/26/2024   Medicare Annual Wellness Visit  04/02/2025   DEXA scan (bone density measurement)  07/03/2028   Colon Cancer Screening  10/19/2028   DTaP/Tdap/Td vaccine (5 - Td or Tdap) 07/08/2032   Pneumococcal Vaccine for age over 2  Completed   Hepatitis B Vaccine  Completed   Hepatitis C Screening  Completed   Zoster (Shingles) Vaccine  Completed   HPV Vaccine  Aged Out   Meningitis B Vaccine  Aged Out    Advanced directives: (Provided) Advance directive discussed with you today. I have provided a copy for you to complete at home and have notarized. Once this is complete, please bring a copy in to our office so we can scan it into your chart.  Advance Care Planning is important because it:  [x]  Makes sure you receive the medical care that is consistent with your values, goals, and preferences  [x]  It provides guidance to your family and loved ones and  reduces their decisional burden about whether or not they are making the right decisions based on your wishes.  Follow the link provided in your after visit summary or read over the paperwork we have mailed to you to help you started getting your Advance Directives in place. If you need assistance in completing these, please reach out to us  so that we can help you!  See attachments for Preventive Care and Fall Prevention Tips.   Fall Prevention in the Home, Adult Falls can cause injuries and affect people of all ages. There are many simple things that you can do to make your home safe and to help prevent falls. If you need it, ask for help making these changes. What actions can I take to prevent falls? General information Use good lighting in all rooms. Make sure to: Replace any light bulbs that burn out. Turn on lights if it is dark and use night-lights. Keep items that you use often in easy-to-reach places. Lower the shelves around your home if needed. Move furniture so that there are clear paths around it. Do not keep throw rugs or other things on the floor that can make you trip. If any of your floors are uneven, fix them. Add color or contrast paint or tape to clearly mark and help you see: Grab bars or handrails. First and last steps  of staircases. Where the edge of each step is. If you use a ladder or stepladder: Make sure that it is fully opened. Do not climb a closed ladder. Make sure the sides of the ladder are locked in place. Have someone hold the ladder while you use it. Know where your pets are as you move through your home. What can I do in the bathroom?     Keep the floor dry. Clean up any water that is on the floor right away. Remove soap buildup in the bathtub or shower. Buildup makes bathtubs and showers slippery. Use non-skid mats or decals on the floor of the bathtub or shower. Attach bath mats securely with double-sided, non-slip rug tape. If you need to sit  down while you are in the shower, use a non-slip stool. Install grab bars by the toilet and in the bathtub and shower. Do not use towel bars as grab bars. What can I do in the bedroom? Make sure that you have a light by your bed that is easy to reach. Do not use any sheets or blankets on your bed that hang to the floor. Have a firm bench or chair with side arms that you can use for support when you get dressed. What can I do in the kitchen? Clean up any spills right away. If you need to reach something above you, use a sturdy step stool that has a grab bar. Keep electrical cables out of the way. Do not use floor polish or wax that makes floors slippery. What can I do with my stairs? Do not leave anything on the stairs. Make sure that you have a light switch at the top and the bottom of the stairs. Have them installed if you do not have them. Make sure that there are handrails on both sides of the stairs. Fix handrails that are broken or loose. Make sure that handrails are as long as the staircases. Install non-slip stair treads on all stairs in your home if they do not have carpet. Avoid having throw rugs at the top or bottom of stairs, or secure the rugs with carpet tape to prevent them from moving. Choose a carpet design that does not hide the edge of steps on the stairs. Make sure that carpet is firmly attached to the stairs. Fix any carpet that is loose or worn. What can I do on the outside of my home? Use bright outdoor lighting. Repair the edges of walkways and driveways and fix any cracks. Clear paths of anything that can make you trip, such as tools or rocks. Add color or contrast paint or tape to clearly mark and help you see high doorway thresholds. Trim any bushes or trees on the main path into your home. Check that handrails are securely fastened and in good repair. Both sides of all steps should have handrails. Install guardrails along the edges of any raised decks or  porches. Have leaves, snow, and ice cleared regularly. Use sand, salt, or ice melt on walkways during winter months if you live where there is ice and snow. In the garage, clean up any spills right away, including grease or oil spills. What other actions can I take? Review your medicines with your health care provider. Some medicines can make you confused or feel dizzy. This can increase your chance of falling. Wear closed-toe shoes that fit well and support your feet. Wear shoes that have rubber soles and low heels. Use a cane, walker, scooter, or crutches  that help you move around if needed. Talk with your provider about other ways that you can decrease your risk of falls. This may include seeing a physical therapist to learn to do exercises to improve movement and strength. Where to find more information Centers for Disease Control and Prevention, [employer]: TonerPromos.no [employer] on Aging: BaseRingTones.pl National Institute on Aging: BaseRingTones.pl Contact a health care provider if: You are afraid of falling at home. You feel weak, drowsy, or dizzy at home. You fall at home. Get help right away if you: Lose consciousness or have trouble moving after a fall. Have a fall that causes a head injury. These symptoms may be an emergency. Get help right away. Call 911. Do not wait to see if the symptoms will go away. Do not drive yourself to the hospital. This information is not intended to replace advice given to you by your health care provider. Make sure you discuss any questions you have with your health care provider. Document Revised: 05/23/2022 Document Reviewed: 05/23/2022 Elsevier Patient Education  2024 [employer].

## 2024-04-23 ENCOUNTER — Other Ambulatory Visit: Payer: Self-pay | Admitting: Cardiology

## 2024-07-22 ENCOUNTER — Ambulatory Visit
Admission: RE | Admit: 2024-07-22 | Discharge: 2024-07-22 | Disposition: A | Discharge status: Home / Self Care | Source: Ambulatory Visit | Attending: Nurse Practitioner | Admitting: Nurse Practitioner

## 2024-07-22 ENCOUNTER — Encounter: Payer: Self-pay | Admitting: Nurse Practitioner

## 2024-07-22 ENCOUNTER — Ambulatory Visit (INDEPENDENT_AMBULATORY_CARE_PROVIDER_SITE_OTHER): Admitting: Nurse Practitioner

## 2024-07-22 VITALS — BP 99/63 | HR 71 | Temp 97.9°F | Ht 63.9 in | Wt 193.0 lb

## 2024-07-22 DIAGNOSIS — R0602 Shortness of breath: Secondary | ICD-10-CM | POA: Diagnosis present

## 2024-07-22 NOTE — Progress Notes (Signed)
 BP 99/63   Pulse 71   Temp 97.9 F (36.6 C) (Oral)   Ht 5' 3.9 (1.623 m)   Wt 193 lb (87.5 kg)   SpO2 97%   BMI 33.23 kg/m    Subjective:    Patient ID: Amanda Ochoa, female    DOB: 1951-04-27, 73 y.o.   MRN: [id number]  HPI: Amanda Ochoa is a 73 y.o. female  Chief Complaint  Patient presents with   Shortness of Breath    Patient states she has been experiencing SOB and scratchy throat since her neighbor has been smoking. States she has been taking throat lozenges to help with her symptoms. States that this has been going on since March. Patient is requesting to have a chest x ray done to make sure her lungs are clear.    Patient presents to clinic with complaints of SOB and a scratchy throat due to her neighbor smoking.  States this has been going on since March.  She uses Lozenges to help with the symptoms.  Patient states she is having a cough, scratchy throat and headaches due to the smoking.  She would like to get a chest xray to make sure there is nothing else going on.  States she doesn't feel like her SOB is related to sleep apnea.  Feels like the symptoms are higher in her chest and up to her throat.      Relevant past medical, surgical, family and social history reviewed and updated as indicated. Interim medical history since our last visit reviewed. Allergies and medications reviewed and updated.  Review of Systems  HENT:         Scratchy throat  Respiratory:  Positive for cough and shortness of breath.   Neurological:  Positive for headaches.    Per HPI unless specifically indicated above     Objective:    BP 99/63   Pulse 71   Temp 97.9 F (36.6 C) (Oral)   Ht 5' 3.9 (1.623 m)   Wt 193 lb (87.5 kg)   SpO2 97%   BMI 33.23 kg/m   Wt Readings from Last 3 Encounters:  07/22/24 193 lb (87.5 kg)  04/02/24 197 lb (89.4 kg)  03/06/24 197 lb 6.4 oz (89.5 kg)    Physical Exam Vitals and nursing note reviewed.  Constitutional:      General: She is not  in acute distress.    Appearance: Normal appearance. She is normal weight. She is not ill-appearing, toxic-appearing or diaphoretic.  HENT:     Head: Normocephalic.     Right Ear: Tympanic membrane and external ear normal.     Left Ear: Tympanic membrane and external ear normal.     Nose: Rhinorrhea present. No congestion.     Mouth/Throat:     Mouth: Mucous membranes are moist.     Pharynx: Oropharynx is clear. Posterior oropharyngeal erythema present. No oropharyngeal exudate.  Eyes:     General:        Right eye: No discharge.        Left eye: No discharge.     Extraocular Movements: Extraocular movements intact.     Conjunctiva/sclera: Conjunctivae normal.     Pupils: Pupils are equal, round, and reactive to light.  Cardiovascular:     Rate and Rhythm: Normal rate and regular rhythm.     Heart sounds: No murmur heard. Pulmonary:     Effort: Pulmonary effort is normal. No respiratory distress.     Breath sounds: Normal breath  sounds. No wheezing or rales.  Musculoskeletal:     Cervical back: Normal range of motion and neck supple.  Skin:    General: Skin is warm and dry.     Capillary Refill: Capillary refill takes less than 2 seconds.  Neurological:     General: No focal deficit present.     Mental Status: She is alert and oriented to person, place, and time. Mental status is at baseline.  Psychiatric:        Mood and Affect: Mood normal.        Behavior: Behavior normal.        Thought Content: Thought content normal.        Judgment: Judgment normal.     Results for orders placed or performed in visit on 02/07/24  Comprehensive metabolic panel with GFR   Collection Time: 02/07/24  9:18 AM  Result Value Ref Range   Glucose 96 70 - 99 mg/dL   BUN 13 8 - 27 mg/dL   Creatinine, Ser 9.10 0.57 - 1.00 mg/dL   eGFR 69 >40 fO/fpw/8.26   BUN/Creatinine Ratio 15 12 - 28   Sodium 143 134 - 144 mmol/L   Potassium 3.9 3.5 - 5.2 mmol/L   Chloride 106 96 - 106 mmol/L   CO2 25  20 - 29 mmol/L   Calcium  9.0 8.7 - 10.3 mg/dL   Total Protein 6.1 6.0 - 8.5 g/dL   Albumin 4.0 3.8 - 4.8 g/dL   Globulin, Total 2.1 1.5 - 4.5 g/dL   Bilirubin Total 0.4 0.0 - 1.2 mg/dL   Alkaline Phosphatase 79 44 - 121 IU/L   AST 15 0 - 40 IU/L   ALT 12 0 - 32 IU/L  Hemoglobin A1c   Collection Time: 02/07/24  9:18 AM  Result Value Ref Range   Hgb A1c MFr Bld 5.8 (H) 4.8 - 5.6 %   Est. average glucose Bld gHb Est-mCnc 120 mg/dL  Lipid panel   Collection Time: 02/07/24  9:18 AM  Result Value Ref Range   Cholesterol, Total 107 100 - 199 mg/dL   Triglycerides 64 0 - 149 mg/dL   HDL 44 >60 mg/dL   VLDL Cholesterol Cal 14 5 - 40 mg/dL   LDL Chol Calc (NIH) 49 0 - 99 mg/dL   Chol/HDL Ratio 2.4 0.0 - 4.4 ratio      Assessment & Plan:   Problem List Items Addressed This Visit   None Visit Diagnoses       Shortness of breath    -  Primary   Secondary to enviromental factors. Recommend OTC antihistamine. Will order chest xray and PFTs to rule out other causes of SOB.Can refer to allergy if needed.   Relevant Orders   DG Chest 2 View   Pulmonary function test        Follow up plan: No follow-ups on file.

## 2024-07-24 ENCOUNTER — Ambulatory Visit: Payer: Self-pay | Admitting: Nurse Practitioner

## 2024-08-12 ENCOUNTER — Ambulatory Visit (INDEPENDENT_AMBULATORY_CARE_PROVIDER_SITE_OTHER): Admitting: Nurse Practitioner

## 2024-08-12 ENCOUNTER — Encounter: Payer: Self-pay | Admitting: Nurse Practitioner

## 2024-08-12 VITALS — BP 110/66 | HR 54 | Temp 97.5°F | Ht 63.1 in | Wt 191.0 lb

## 2024-08-12 DIAGNOSIS — I10 Essential (primary) hypertension: Secondary | ICD-10-CM | POA: Diagnosis not present

## 2024-08-12 DIAGNOSIS — I7143 Infrarenal abdominal aortic aneurysm, without rupture: Secondary | ICD-10-CM | POA: Diagnosis not present

## 2024-08-12 DIAGNOSIS — I700 Atherosclerosis of aorta: Secondary | ICD-10-CM | POA: Diagnosis not present

## 2024-08-12 DIAGNOSIS — R7303 Prediabetes: Secondary | ICD-10-CM

## 2024-08-12 DIAGNOSIS — Z0000 Encounter for general adult medical examination without abnormal findings: Secondary | ICD-10-CM | POA: Diagnosis not present

## 2024-08-12 DIAGNOSIS — E782 Mixed hyperlipidemia: Secondary | ICD-10-CM

## 2024-08-12 MED ORDER — ROSUVASTATIN CALCIUM 5 MG PO TABS: 5.0000 mg | ORAL_TABLET | Freq: Every day | ORAL | 1 refills | Status: AC

## 2024-08-12 MED ORDER — LOSARTAN POTASSIUM 50 MG PO TABS: 50.0000 mg | ORAL_TABLET | Freq: Every day | ORAL | 1 refills | Status: AC

## 2024-08-12 MED ORDER — CLOPIDOGREL BISULFATE 75 MG PO TABS: 75.0000 mg | ORAL_TABLET | Freq: Every day | ORAL | 1 refills | Status: AC

## 2024-08-12 NOTE — Assessment & Plan Note (Signed)
 Chronic.  Followed by Cardiology and vascular surgery.  Last CT was done in February 2024.  Saw Vascular surgery in March- no intervention at this time.  Notes were reviewed.  Continue with statin therapy.  Followed by Cardiology and Vascular surgery.

## 2024-08-12 NOTE — Progress Notes (Signed)
 BP 110/66   Pulse (!) 54   Temp (!) 97.5 F (36.4 C) (Oral)   Ht 5' 3.1 (1.603 m)   Wt 191 lb (86.6 kg)   SpO2 97%   BMI 33.73 kg/m    Subjective:    Patient ID: Amanda Ochoa, female    DOB: May 03, 1951, 73 y.o.   MRN: [id number]  HPI: Amanda Ochoa is a 73 y.o. female presenting on 08/12/2024 for comprehensive medical examination. Current medical complaints include:none  She currently lives with: Menopausal Symptoms: no  HYPERTENSION without Chronic Kidney Disease Seeing Dr. Darliss regularly.  Last AAA CT was 2/24- recommend semi annual imaging. Hypertension status: controlled  Satisfied with current treatment? yes Duration of hypertension: years BP monitoring frequency:  daily BP range: 120/80 BP medication side effects:  no Medication compliance: excellent compliance Previous BP meds:amlodipine , carvedilol , and losartan  (cozaar ) Aspirin: no Recurrent headaches: no Visual changes: no Palpitations: no Dyspnea: no Chest pain: no Lower extremity edema: no Dizzy/lightheaded: no Also on rosuvastatin  for cholesterol.    Depression Screen done today and results listed below:     08/12/2024   10:04 AM 04/02/2024   11:38 AM 02/07/2024    8:51 AM 08/10/2023    9:00 AM 03/27/2023    9:05 AM  Depression screen PHQ 2/9  Decreased Interest 0 0 0 0 0  Down, Depressed, Hopeless 0 0 0 0 0  PHQ - 2 Score 0 0 0 0 0  Altered sleeping 0 0 0 0 0  Tired, decreased energy 0 0 0 0 0  Change in appetite 0 0 0 0 0  Feeling bad or failure about yourself  0 0 0 0 0  Trouble concentrating 0 0 0 0 0  Moving slowly or fidgety/restless 0 0 0 0 0  Suicidal thoughts 0 0 0 0 0  PHQ-9 Score 0 0  0  0  0   Difficult doing work/chores Not difficult at all Not difficult at all Not difficult at all  Not difficult at all     Data saved with a previous flowsheet row definition    The patient does not have a history of falls. I did complete a risk assessment for falls. A plan of care for  falls was documented.   Past Medical History:  Past Medical History:  Diagnosis Date   Abdominal aneurysm    Diverticulitis    GERD (gastroesophageal reflux disease)    Hypertension    Sciatica    Ochoa apnea     Surgical History:  Past Surgical History:  Procedure Laterality Date   ABDOMINAL HYSTERECTOMY     COLONOSCOPY WITH PROPOFOL  N/A 10/19/2021   Procedure: COLONOSCOPY WITH PROPOFOL ;  Surgeon: Jinny Carmine, MD;  Location: [facility] ENDOSCOPY;  Service: Endoscopy;  Laterality: N/A;   CORONARY ANGIOPLASTY WITH STENT PLACEMENT     TOTAL ABDOMINAL HYSTERECTOMY W/ BILATERAL SALPINGOOPHORECTOMY      Medications:  Current Outpatient Medications on File Prior to Visit  Medication Sig   alendronate  (FOSAMAX ) 70 MG tablet Take 1 tablet (70 mg total) by mouth every 7 (seven) days. Take with a full glass of water on an empty stomach.   amLODipine  (NORVASC ) 5 MG tablet Take 1 tablet (5 mg total) by mouth daily.   Apoaequorin (PREVAGEN PO) Take by mouth daily. Unable to verify dosage   b complex vitamins capsule Take 1 capsule by mouth daily.   carvedilol  (COREG ) 12.5 MG tablet TAKE 1 TABLET BY MOUTH TWICE DAILY WITH A MEAL  cholecalciferol (VITAMIN D3) 25 MCG (1000 UNIT) tablet Take 1,000 Units by mouth daily.   clopidogrel  (PLAVIX ) 75 MG tablet Take 1 tablet (75 mg total) by mouth daily.   losartan  (COZAAR ) 50 MG tablet Take 1 tablet (50 mg total) by mouth daily.   Multiple Minerals-Vitamins (CALCIUM -MAGNESIUM-ZINC-D3) TABS Take 1 tablet by mouth daily.   rosuvastatin  (CRESTOR ) 5 MG tablet Take 1 tablet (5 mg total) by mouth daily.   tobramycin (TOBREX) 0.3 % ophthalmic solution Place 1 drop into the right eye 4 (four) times daily.   VITAMIN A PO Take 1 tablet by mouth daily.   No current facility-administered medications on file prior to visit.    Allergies:  Allergies  Allergen Reactions   Penicillins Itching and Rash    Social History:  Social History   Socioeconomic  History   Marital status: Divorced    Spouse name: Not on file   Number of children: 3   Years of education: Not on file   Highest education level: Not on file  Occupational History   Occupation: retired  Tobacco Use   Smoking status: Never    Passive exposure: Never   Smokeless tobacco: Never  Vaping Use   Vaping status: Never Used  Substance and Sexual Activity   Alcohol use: Never   Drug use: Never   Sexual activity: Not Currently  Other Topics Concern   Not on file  Social History Narrative   Not on file   Social Drivers of Health   Financial Resource Strain: Low Risk  (04/02/2024)   Overall Financial Resource Strain (CARDIA)    Difficulty of Paying Living Expenses: Not hard at all  Food Insecurity: No Food Insecurity (04/02/2024)   Hunger Vital Sign    Worried About Running Out of Food in the Last Year: Never true    Ran Out of Food in the Last Year: Never true  Transportation Needs: No Transportation Needs (04/02/2024)   PRAPARE - Administrator, Civil Service (Medical): No    Lack of Transportation (Non-Medical): No  Physical Activity: Inactive (04/02/2024)   Exercise Vital Sign    Days of Exercise per Week: 0 days    Minutes of Exercise per Session: 0 min  Stress: No Stress Concern Present (04/02/2024)   [employer] of Occupational Health - Occupational Stress Questionnaire    Feeling of Stress: Only a little  Social Connections: Socially Isolated (04/02/2024)   Social Connection and Isolation Panel    Frequency of Communication with Friends and Family: More than three times a week    Frequency of Social Gatherings with Friends and Family: More than three times a week    Attends Religious Services: Never    Database Administrator or Organizations: No    Attends Banker Meetings: Never    Marital Status: Divorced  Catering Manager Violence: Not At Risk (04/02/2024)   Humiliation, Afraid, Rape, and Kick questionnaire    Fear of Current or  Ex-Partner: No    Emotionally Abused: No    Physically Abused: No    Sexually Abused: No   Social History   Tobacco Use  Smoking Status Never   Passive exposure: Never  Smokeless Tobacco Never   Social History   Substance and Sexual Activity  Alcohol Use Never    Family History:  Family History  Problem Relation Age of Onset   Diabetes Mother    Other Father        unknown medical  history   Healthy Brother    Healthy Daughter    Healthy Son    Diabetes Maternal Grandmother    Heart disease Maternal Grandmother    Healthy Maternal Grandfather    Breast cancer Neg Hx     Past medical history, surgical history, medications, allergies, family history and social history reviewed with patient today and changes made to appropriate areas of the chart.   Review of Systems  Eyes:  Negative for blurred vision and double vision.  Respiratory:  Negative for shortness of breath.   Cardiovascular:  Negative for chest pain, palpitations and leg swelling.  Neurological:  Negative for dizziness and headaches.   All other ROS negative except what is listed above and in the HPI.      Objective:    BP 110/66   Pulse (!) 54   Temp (!) 97.5 F (36.4 C) (Oral)   Ht 5' 3.1 (1.603 m)   Wt 191 lb (86.6 kg)   SpO2 97%   BMI 33.73 kg/m   Wt Readings from Last 3 Encounters:  08/12/24 191 lb (86.6 kg)  07/22/24 193 lb (87.5 kg)  04/02/24 197 lb (89.4 kg)    Physical Exam Vitals and nursing note reviewed.  Constitutional:      General: She is awake. She is not in acute distress.    Appearance: Normal appearance. She is well-developed. She is obese. She is not ill-appearing.  HENT:     Head: Normocephalic and atraumatic.     Right Ear: Hearing, tympanic membrane, ear canal and external ear normal. No drainage.     Left Ear: Hearing, tympanic membrane, ear canal and external ear normal. No drainage.     Nose: Nose normal.     Right Sinus: No maxillary sinus tenderness or frontal  sinus tenderness.     Left Sinus: No maxillary sinus tenderness or frontal sinus tenderness.     Mouth/Throat:     Mouth: Mucous membranes are moist.     Pharynx: Oropharynx is clear. Uvula midline. No pharyngeal swelling, oropharyngeal exudate or posterior oropharyngeal erythema.  Eyes:     General: Lids are normal.        Right eye: No discharge.        Left eye: No discharge.     Extraocular Movements: Extraocular movements intact.     Conjunctiva/sclera: Conjunctivae normal.     Pupils: Pupils are equal, round, and reactive to light.     Visual Fields: Right eye visual fields normal and left eye visual fields normal.  Neck:     Thyroid: No thyromegaly.     Vascular: No carotid bruit.     Trachea: Trachea normal.  Cardiovascular:     Rate and Rhythm: Normal rate and regular rhythm.     Heart sounds: Normal heart sounds. No murmur heard.    No gallop.  Pulmonary:     Effort: Pulmonary effort is normal. No accessory muscle usage or respiratory distress.     Breath sounds: Normal breath sounds.  Chest:  Breasts:    Right: Normal.     Left: Normal.  Abdominal:     General: Bowel sounds are normal.     Palpations: Abdomen is soft. There is no hepatomegaly or splenomegaly.     Tenderness: There is no abdominal tenderness.  Musculoskeletal:        General: Normal range of motion.     Cervical back: Normal range of motion and neck supple.     Right  lower leg: No edema.     Left lower leg: No edema.  Lymphadenopathy:     Head:     Right side of head: No submental, submandibular, tonsillar, preauricular or posterior auricular adenopathy.     Left side of head: No submental, submandibular, tonsillar, preauricular or posterior auricular adenopathy.     Cervical: No cervical adenopathy.     Upper Body:     Right upper body: No supraclavicular, axillary or pectoral adenopathy.     Left upper body: No supraclavicular, axillary or pectoral adenopathy.  Skin:    General: Skin is warm  and dry.     Capillary Refill: Capillary refill takes less than 2 seconds.     Findings: No rash.  Neurological:     Mental Status: She is alert and oriented to person, place, and time.     Gait: Gait is intact.     Deep Tendon Reflexes: Reflexes are normal and symmetric.     Reflex Scores:      Brachioradialis reflexes are 2+ on the right side and 2+ on the left side.      Patellar reflexes are 2+ on the right side and 2+ on the left side. Psychiatric:        Attention and Perception: Attention normal.        Mood and Affect: Mood normal.        Speech: Speech normal.        Behavior: Behavior normal. Behavior is cooperative.        Thought Content: Thought content normal.        Judgment: Judgment normal.     Results for orders placed or performed in visit on 02/07/24  Comprehensive metabolic panel with GFR   Collection Time: 02/07/24  9:18 AM  Result Value Ref Range   Glucose 96 70 - 99 mg/dL   BUN 13 8 - 27 mg/dL   Creatinine, Ser 9.10 0.57 - 1.00 mg/dL   eGFR 69 >40 fO/fpw/8.26   BUN/Creatinine Ratio 15 12 - 28   Sodium 143 134 - 144 mmol/L   Potassium 3.9 3.5 - 5.2 mmol/L   Chloride 106 96 - 106 mmol/L   CO2 25 20 - 29 mmol/L   Calcium  9.0 8.7 - 10.3 mg/dL   Total Protein 6.1 6.0 - 8.5 g/dL   Albumin 4.0 3.8 - 4.8 g/dL   Globulin, Total 2.1 1.5 - 4.5 g/dL   Bilirubin Total 0.4 0.0 - 1.2 mg/dL   Alkaline Phosphatase 79 44 - 121 IU/L   AST 15 0 - 40 IU/L   ALT 12 0 - 32 IU/L  Hemoglobin A1c   Collection Time: 02/07/24  9:18 AM  Result Value Ref Range   Hgb A1c MFr Bld 5.8 (H) 4.8 - 5.6 %   Est. average glucose Bld gHb Est-mCnc 120 mg/dL  Lipid panel   Collection Time: 02/07/24  9:18 AM  Result Value Ref Range   Cholesterol, Total 107 100 - 199 mg/dL   Triglycerides 64 0 - 149 mg/dL   HDL 44 >60 mg/dL   VLDL Cholesterol Cal 14 5 - 40 mg/dL   LDL Chol Calc (NIH) 49 0 - 99 mg/dL   Chol/HDL Ratio 2.4 0.0 - 4.4 ratio      Assessment & Plan:   Problem List  Items Addressed This Visit       Cardiovascular and Mediastinum   Hypertension (Chronic)   Chronic.  Controlled.  Continue with current medication regimen of Amlodipine ,  Carvedilol  and Losartan .  Refills sent today.  Continue to check blood pressures at home and bring log to next visit.  Labs ordered today.  Reviewed recent Cardiology note.  Return to clinic in 6 months for reevaluation.  Call sooner if concerns arise.       Thoracic aorta atherosclerosis (Chronic)   Continue with statin therapy.  Followed by Cardiology and Vascular surgery.       AAA (abdominal aortic aneurysm) without rupture (Chronic)   Chronic.  Followed by Cardiology and vascular surgery.  Last CT was done in February 2024.  Saw Vascular surgery in March- no intervention at this time.  Notes were reviewed.  Continue with statin therapy.  Followed by Cardiology and Vascular surgery.         Other   Prediabetes (Chronic)   Labs ordered at visit today.  Will make recommendations based on lab results.       Relevant Orders   Hemoglobin A1c   Mixed hyperlipidemia (Chronic)   Chronic.  Controlled.  Continue with current medication regimen of Crestor  20mg  daily.  Refills sent today.  Labs ordered today.  Return to clinic in 6 months for reevaluation.  Call sooner if concerns arise.       Relevant Orders   Lipid panel   Severe obesity (BMI 35.0-39.9) with comorbidity (HCC) (Chronic)   Recommended eating smaller high protein, low fat meals more frequently and exercising 30 mins a day 5 times a week with a goal of 10-15lb weight loss in the next 3 months.       Other Visit Diagnoses       Annual physical exam    -  Primary   Health maintenance reviewed during visit today. Labs ordered.  Vaccines reviewed. Mammogram and Colonoscopy up to date.   Relevant Orders   CBC with Differential/Platelet   Comprehensive metabolic panel with GFR   Lipid panel   TSH   Hemoglobin A1c         Follow up plan: Return in  about 6 months (around 02/09/2025) for HTN, HLD, DM2 FU.   LABORATORY TESTING:  - Pap smear: not applicable  IMMUNIZATIONS:   - Tdap: Tetanus vaccination status reviewed: last tetanus booster within 10 years. - Influenza: Up to date - Pneumovax: Up to date - Prevnar: Up to date - COVID: Up to date - HPV: Not applicable - Shingrix vaccine: Up to date  SCREENING: -Mammogram: Up to date  - Colonoscopy: Up to date  - Bone Density: Up to date  -Hearing Test: Not applicable  -Spirometry: Not applicable   PATIENT COUNSELING:   Advised to take 1 mg of folate supplement per day if capable of pregnancy.   Sexuality: Discussed sexually transmitted diseases, partner selection, use of condoms, avoidance of unintended pregnancy  and contraceptive alternatives.   Advised to avoid cigarette smoking.  I discussed with the patient that most people either abstain from alcohol or drink within safe limits (<=14/week and <=4 drinks/occasion for males, <=7/weeks and <= 3 drinks/occasion for females) and that the risk for alcohol disorders and other health effects rises proportionally with the number of drinks per week and how often a drinker exceeds daily limits.  Discussed cessation/primary prevention of drug use and availability of treatment for abuse.   Diet: Encouraged to adjust caloric intake to maintain  or achieve ideal body weight, to reduce intake of dietary saturated fat and total fat, to limit sodium intake by avoiding high sodium foods and not adding  table salt, and to maintain adequate dietary potassium and calcium  preferably from fresh fruits, vegetables, and low-fat dairy products.    stressed the importance of regular exercise  Injury prevention: Discussed safety belts, safety helmets, smoke detector, smoking near bedding or upholstery.   Dental health: Discussed importance of regular tooth brushing, flossing, and dental visits.    NEXT PREVENTATIVE PHYSICAL DUE IN 1 YEAR. Return  in about 6 months (around 02/09/2025) for HTN, HLD, DM2 FU.

## 2024-08-12 NOTE — Assessment & Plan Note (Signed)
 Labs ordered at visit today.  Will make recommendations based on lab results.

## 2024-08-12 NOTE — Assessment & Plan Note (Signed)
 Recommended eating smaller high protein, low fat meals more frequently and exercising 30 mins a day 5 times a week with a goal of 10-15lb weight loss in the next 3 months.

## 2024-08-12 NOTE — Assessment & Plan Note (Signed)
Continue with statin therapy.  Followed by Cardiology and Vascular surgery.  

## 2024-08-12 NOTE — Assessment & Plan Note (Signed)
Chronic.  Controlled.  Continue with current medication regimen of Crestor 20mg  daily.  Refills sent today.  Labs ordered today.  Return to clinic in 6 months for reevaluation.  Call sooner if concerns arise.

## 2024-08-12 NOTE — Assessment & Plan Note (Signed)
 Chronic.  Controlled.  Continue with current medication regimen of Amlodipine , Carvedilol  and Losartan .  Refills sent today.  Continue to check blood pressures at home and bring log to next visit.  Labs ordered today.  Reviewed recent Cardiology note.  Return to clinic in 6 months for reevaluation.  Call sooner if concerns arise.

## 2024-08-13 ENCOUNTER — Ambulatory Visit: Payer: Self-pay | Admitting: Nurse Practitioner

## 2024-08-13 LAB — LIPID PANEL
Chol/HDL Ratio: 2.5 ratio (ref 0.0–4.4)
Cholesterol, Total: 125 mg/dL (ref 100–199)
HDL: 51 mg/dL (ref >39)
LDL Chol Calc (NIH): 61 mg/dL (ref 0–99)
Triglycerides: 58 mg/dL (ref 0–149)
VLDL Cholesterol Cal: 13 mg/dL (ref 5–40)

## 2024-08-13 LAB — CBC WITH DIFFERENTIAL/PLATELET
Basophils Absolute: 0 x10E3/uL (ref 0.0–0.2)
Basos: 1 %
EOS (ABSOLUTE): 0.1 x10E3/uL (ref 0.0–0.4)
Eos: 2 %
Hematocrit: 34.5 % (ref 34.0–46.6)
Hemoglobin: 10.6 g/dL — ABNORMAL LOW (ref 11.1–15.9)
Immature Grans (Abs): 0 x10E3/uL (ref 0.0–0.1)
Immature Granulocytes: 0 %
Lymphocytes Absolute: 1.9 x10E3/uL (ref 0.7–3.1)
Lymphs: 48 %
MCH: 27.7 pg (ref 26.6–33.0)
MCHC: 30.7 g/dL — ABNORMAL LOW (ref 31.5–35.7)
MCV: 90 fL (ref 79–97)
Monocytes Absolute: 0.3 x10E3/uL (ref 0.1–0.9)
Monocytes: 8 %
Neutrophils Absolute: 1.6 x10E3/uL (ref 1.4–7.0)
Neutrophils: 41 %
Platelets: 289 x10E3/uL (ref 150–450)
RBC: 3.83 x10E6/uL (ref 3.77–5.28)
RDW: 13.4 % (ref 11.7–15.4)
WBC: 3.9 x10E3/uL (ref 3.4–10.8)

## 2024-08-13 LAB — COMPREHENSIVE METABOLIC PANEL WITH GFR
ALT: 13 IU/L (ref 0–32)
AST: 19 IU/L (ref 0–40)
Albumin: 4.1 g/dL (ref 3.8–4.8)
Alkaline Phosphatase: 76 IU/L (ref 49–135)
BUN/Creatinine Ratio: 13 (ref 12–28)
BUN: 9 mg/dL (ref 8–27)
Bilirubin Total: 0.5 mg/dL (ref 0.0–1.2)
CO2: 24 mmol/L (ref 20–29)
Calcium: 9.4 mg/dL (ref 8.7–10.3)
Chloride: 104 mmol/L (ref 96–106)
Creatinine, Ser: 0.69 mg/dL (ref 0.57–1.00)
Globulin, Total: 2.1 g/dL (ref 1.5–4.5)
Glucose: 76 mg/dL (ref 70–99)
Potassium: 3.9 mmol/L (ref 3.5–5.2)
Sodium: 140 mmol/L (ref 134–144)
Total Protein: 6.2 g/dL (ref 6.0–8.5)
eGFR: 93 mL/min/1.73 (ref >59)

## 2024-08-13 LAB — HEMOGLOBIN A1C
Est. average glucose Bld gHb Est-mCnc: 114 mg/dL
Hgb A1c MFr Bld: 5.6 % (ref 4.8–5.6)

## 2024-08-13 LAB — TSH: TSH: 2.09 u[IU]/mL (ref 0.450–4.500)

## 2024-09-12 NOTE — Progress Notes (Unsigned)
 Cardiology Office Note:    Date:  09/13/2024   ID:  Amanda Ochoa, DOB Mar 31, 1951, MRN [id number]  PCP:  Melvin Pao, NP   [facility] Providers Cardiologist:  Redell Cave, MD { Click to update primary MD,subspecialty MD or APP then REFRESH:1}    Referring MD: Melvin Pao, NP   Chief complaint: 39-month follow-up     History of Present Illness:   Amanda Ochoa is a 73 y.o. 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  Originally seen by our cardiology practice in January 2024.  Symptoms began with dizziness in 2020, found to have a penetrating thoracic ulcer, underwent endograft placement leading to stenosis of left subclavian artery, DES placed in subclavian artery in 2024.  Echocardiogram at that time showed LVEF 45-50%, mildly decreased LV function, some RWMA, moderate LVH, G1 DD, normal RV, LA moderately dilated, normal MV, mild AV regurgitation, mild dilatation of the aortic root measuring 43 mm, mild dilatation of the ascending aorta measuring 45 mm. Dr. Cave interpreted the study as an LVEF of 50-55%. Carvedilol  was reduced due to bradycardia and low normal BP.  CT angio chest aorta showed ascending thoracic aorta of 4.5 cm, proximal aortic arch at 3.8 cm.  Distal aortic arch and proximal descending thoracic aortic stent.  Cardiomegaly with left atrial and left ventricular enlargement.  Was referred to vascular surgery, who plan to continue surveillance with CT scans.  Most recently seen in the cardiology office in June 2025, was doing well at that time, amlodipine  reduced to 5 mg daily secondary to BP low normal.  Was continued on Plavix  and statin following her  endograft placement.  Presents independently, appears to be doing well from a cardiovascular standpoint. She denies chest pain, palpitations, dyspnea, orthopnea, n, v, dark/tarry/bloody stools, hematuria, dizziness, syncope, edema, weight gain.  Her primary frustration today involves her neighbor who smokes frequently and they are shared, supposedly no-smoking senior living apartment complex.  Patient takes a lot of pride in the work that she puts into keeping her apartment clean and orderly.  Frequently tasks are self to moderate intensity cleaning chores such as scrubbing her walls, baseboards, mopping, etc..  She reports her blood pressures are stable, and plans to follow-up with vascular regarding her aortic dilatation.  ROS:   Please see the history of present illness.    All other systems reviewed and are negative.     Past Medical History:  Diagnosis Date   AAA (abdominal aortic aneurysm)    a. 12/2023 Abd Ao Duplex: 3.0 cm AAA.   Ascending aortic aneurysm    a. 11/2022 CTA chest/aorta: Asc Ao 4.5cm, prox Ao arch 3.8 cm.   Diverticulitis    GERD (gastroesophageal reflux disease)    Hypertension    Penetrating ulcer of aorta    a. 2020 s/p endograft stent in distal thoracic arch to proximal descending thoracic aorta.   Sciatica    Sleep apnea    Stenosis of left subclavian artery    a. 2020 s/p stenting.    Past Surgical History:  Procedure Laterality Date   ABDOMINAL HYSTERECTOMY     COLONOSCOPY WITH PROPOFOL  N/A 10/19/2021   Procedure: COLONOSCOPY WITH PROPOFOL ;  Surgeon: Jinny Carmine, MD;  Location: [facility] ENDOSCOPY;  Service:  Endoscopy;  Laterality: N/A;   CORONARY ANGIOPLASTY WITH STENT PLACEMENT     TOTAL ABDOMINAL HYSTERECTOMY W/ BILATERAL SALPINGOOPHORECTOMY      Current Medications: Active Medications[1]   Allergies:   Penicillins   Social History   Socioeconomic History   Marital status: Divorced    Spouse name: Not on file   Number of children: 3   Years of  education: Not on file   Highest education level: Not on file  Occupational History   Occupation: retired  Tobacco Use   Smoking status: Never    Passive exposure: Never   Smokeless tobacco: Never  Vaping Use   Vaping status: Never Used  Substance and Sexual Activity   Alcohol use: Never   Drug use: Never   Sexual activity: Not Currently  Other Topics Concern   Not on file  Social History Narrative   Not on file   Social Drivers of Health   Tobacco Use: Low Risk (09/13/2024)   Patient History    Smoking Tobacco Use: Never    Smokeless Tobacco Use: Never    Passive Exposure: Never  Financial Resource Strain: Low Risk (04/02/2024)   Overall Financial Resource Strain (CARDIA)    Difficulty of Paying Living Expenses: Not hard at all  Food Insecurity: No Food Insecurity (04/02/2024)   Epic    Worried About Radiation Protection Practitioner of Food in the Last Year: Never true    Ran Out of Food in the Last Year: Never true  Transportation Needs: No Transportation Needs (04/02/2024)   Epic    Lack of Transportation (Medical): No    Lack of Transportation (Non-Medical): No  Physical Activity: Inactive (04/02/2024)   Exercise Vital Sign    Days of Exercise per Week: 0 days    Minutes of Exercise per Session: 0 min  Stress: No Stress Concern Present (04/02/2024)   [employer] of Occupational Health - Occupational Stress Questionnaire    Feeling of Stress: Only a little  Social Connections: Socially Isolated (04/02/2024)   Social Connection and Isolation Panel    Frequency of Communication with Friends and Family: More than three times a week    Frequency of Social Gatherings with Friends and Family: More than three times a week    Attends Religious Services: Never    Database Administrator or Organizations: No    Attends Banker Meetings: Never    Marital Status: Divorced  Depression (PHQ2-9): Low Risk (08/12/2024)   Depression (PHQ2-9)    PHQ-2 Score: 0  Alcohol Screen: Low Risk  (04/02/2024)   Alcohol Screen    Last Alcohol Screening Score (AUDIT): 0  Housing: Low Risk (04/02/2024)   Epic    Unable to Pay for Housing in the Last Year: No    Number of Times Moved in the Last Year: 0    Homeless in the Last Year: No  Utilities: Not At Risk (04/02/2024)   Epic    Threatened with loss of utilities: No  Health Literacy: Adequate Health Literacy (04/02/2024)   B1300 Health Literacy    Frequency of need for help with medical instructions: Never     Family History: The patient's family history includes Diabetes in her maternal grandmother and mother; Healthy in her brother, daughter, maternal grandfather, and son; Heart disease in her maternal grandmother; Other in her father. There is no history of Breast cancer.  EKGs/Labs/Other Studies Reviewed:    The following studies were reviewed today:  EKG Interpretation Date/Time:  Friday September 13 2024 09:32:29 EST Ventricular Rate:  52 PR Interval:  180 QRS Duration:  104 QT Interval:  430 QTC Calculation: 399 R Axis:   -39  Text Interpretation: Sinus bradycardia Left axis deviation Moderate voltage criteria for LVH, may be normal variant ( R in aVL , Cornell product ) Nonspecific T wave abnormality No significant change from prior studies Confirmed by Derik Fults [phone]) on 09/13/2024 9:37:57 AM    Recent Labs: 08/12/2024: ALT 13; BUN 9; Creatinine, Ser 0.69; Hemoglobin 10.6; Platelets 289; Potassium 3.9; Sodium 140; TSH 2.090  Recent Lipid Panel    Component Value Date/Time   CHOL 125 08/12/2024 1013   TRIG 58 08/12/2024 1013   HDL 51 08/12/2024 1013   CHOLHDL 2.5 08/12/2024 1013   LDLCALC 61 08/12/2024 1013         Physical Exam:    VS:  BP 110/68 (BP Location: Left Arm, Patient Position: Sitting, Cuff Size: Normal)   Pulse (!) 52   Ht 5' 4 (1.626 m)   SpO2 99%   BMI 32.79 kg/m        Wt Readings from Last 3 Encounters:  08/12/24 191 lb (86.6 kg)  07/22/24 193 lb (87.5 kg)  04/02/24 197 lb  (89.4 kg)     GEN:  Well nourished, well developed in no acute distress HEENT: Normal NECK: No carotid bruits CARDIAC: S1-S2 normal, RRR, no murmurs, rubs, gallops RESPIRATORY:  Clear to auscultation without rales, wheezing or rhonchi  MUSCULOSKELETAL:  No edema; No deformity  SKIN: Warm and dry NEUROLOGIC:  Alert and oriented x 3 PSYCHIATRIC:  Normal affect       Assessment & Plan Aneurysm of ascending aorta without rupture CT aorta 11/17/2022: Aortic root dilatation with aneurysmal dilatation of the ascending thoracic aorta at 4.5 cm, dilatation of the proximal aortic arch of 3.8 cm CTA chest/aorta and follow-up with vascular already scheduled for March 2026 Denies CP, back pain, near syncope Primary hypertension Reports BPs well-controlled at home Continue amlodipine  5 mg daily Continue Coreg  12.5 mg twice daily Continue losartan  50 mg daily Anemia, unspecified type CBC 08/12/2024: Hemoglobin 10.6, lower than her normal which averages around 12 Denies any bleeding, bruising, dark/tarry/bloody stools, hematuria Will repeat CBC today  Disposition: *** Route to primary cardiologist           Medication Adjustments/Labs and Tests Ordered: Current medicines are reviewed at length with the patient today.  Concerns regarding medicines are outlined above.  Orders Placed This Encounter  Procedures   CBC   EKG 12-Lead   No orders of the defined types were placed in this encounter.   Patient Instructions  Medication Instructions:  Your physician recommends that you continue on your current medications as directed. Please refer to the Current Medication list given to you today.   *If you need a refill on your cardiac medications before your next appointment, please call your pharmacy*  Lab Work: Your provider would like for you to have following labs drawn today CBC.  If you have labs (blood work) drawn today and your tests are completely normal, you will receive your  results only by: MyChart Message (if you have MyChart) OR A paper copy in the mail If you have any lab test that is abnormal or we need to change your treatment, we will call you to review the results.  Follow-Up: At [facility], you and your health needs are our priority.  As part of our continuing mission to provide you with  exceptional heart care, our providers are all part of one team.  This team includes your primary Cardiologist (physician) and Advanced Practice Providers or APPs (Physician Assistants and Nurse Practitioners) who all work together to provide you with the care you need, when you need it.  Your next appointment:   6 month(s)  Provider:   You may see Redell Cave, MD or Lonni Meager, NP   We recommend signing up for the patient portal called MyChart.  Sign up information is provided on this After Visit Summary.  MyChart is used to connect with patients for Virtual Visits (Telemedicine).  Patients are able to view lab/test results, encounter notes, upcoming appointments, etc.  Non-urgent messages can be sent to your provider as well.   To learn more about what you can do with MyChart, go to [email or web address].    Signed, Adriannah Steinkamp E Deshanti Adcox, NP  09/13/2024 10:21 AM    [facility]     [1]  Current Meds  Medication Sig   alendronate  (FOSAMAX ) 70 MG tablet Take 1 tablet (70 mg total) by mouth every 7 (seven) days. Take with a full glass of water on an empty stomach.   amLODipine  (NORVASC ) 5 MG tablet Take 1 tablet (5 mg total) by mouth daily.   Apoaequorin (PREVAGEN PO) Take by mouth daily. Unable to verify dosage   b complex vitamins capsule Take 1 capsule by mouth daily.   carvedilol  (COREG ) 12.5 MG tablet TAKE 1 TABLET BY MOUTH TWICE DAILY WITH A MEAL   cholecalciferol (VITAMIN D3) 25 MCG (1000 UNIT) tablet Take 1,000 Units by mouth daily.   clopidogrel  (PLAVIX ) 75 MG tablet Take 1 tablet (75 mg total) by mouth daily.   losartan   (COZAAR ) 50 MG tablet Take 1 tablet (50 mg total) by mouth daily.   Multiple Minerals-Vitamins (CALCIUM -MAGNESIUM-ZINC-D3) TABS Take 1 tablet by mouth daily.   rosuvastatin  (CRESTOR ) 5 MG tablet Take 1 tablet (5 mg total) by mouth daily.   tobramycin (TOBREX) 0.3 % ophthalmic solution Place 1 drop into the right eye 4 (four) times daily.   VITAMIN A PO Take 1 tablet by mouth daily.

## 2024-09-13 ENCOUNTER — Ambulatory Visit: Attending: Nurse Practitioner | Admitting: Nurse Practitioner

## 2024-09-13 ENCOUNTER — Encounter: Payer: Self-pay | Admitting: Nurse Practitioner

## 2024-09-13 VITALS — BP 110/68 | HR 52 | Ht 64.0 in

## 2024-09-13 DIAGNOSIS — D649 Anemia, unspecified: Secondary | ICD-10-CM | POA: Diagnosis not present

## 2024-09-13 DIAGNOSIS — I7121 Aneurysm of the ascending aorta, without rupture: Secondary | ICD-10-CM

## 2024-09-13 DIAGNOSIS — I10 Essential (primary) hypertension: Secondary | ICD-10-CM

## 2024-09-13 NOTE — Patient Instructions (Signed)
 Medication Instructions:  Your physician recommends that you continue on your current medications as directed. Please refer to the Current Medication list given to you today.   *If you need a refill on your cardiac medications before your next appointment, please call your pharmacy*  Lab Work: Your provider would like for you to have following labs drawn today CBC.  If you have labs (blood work) drawn today and your tests are completely normal, you will receive your results only by: MyChart Message (if you have MyChart) OR A paper copy in the mail If you have any lab test that is abnormal or we need to change your treatment, we will call you to review the results.  Follow-Up: At [facility], you and your health needs are our priority.  As part of our continuing mission to provide you with exceptional heart care, our providers are all part of one team.  This team includes your primary Cardiologist (physician) and Advanced Practice Providers or APPs (Physician Assistants and Nurse Practitioners) who all work together to provide you with the care you need, when you need it.  Your next appointment:   6 month(s)  Provider:   You may see Redell Cave, MD or Lonni Meager, NP   We recommend signing up for the patient portal called MyChart.  Sign up information is provided on this After Visit Summary.  MyChart is used to connect with patients for Virtual Visits (Telemedicine).  Patients are able to view lab/test results, encounter notes, upcoming appointments, etc.  Non-urgent messages can be sent to your provider as well.   To learn more about what you can do with MyChart, go to [email or web address].

## 2024-09-13 NOTE — Assessment & Plan Note (Addendum)
 CT aorta 11/17/2022: Aortic root dilatation with aneurysmal dilatation of the ascending thoracic aorta at 4.5 cm, dilatation of the proximal aortic arch of 3.8 cm Denies CP, back pain, near syncope CTA chest/aorta and follow-up with vascular already scheduled for March 2026

## 2024-09-13 NOTE — Assessment & Plan Note (Signed)
 Reports BPs well-controlled at home Continue amlodipine  5 mg daily Continue Coreg  12.5 mg twice daily Continue losartan  50 mg daily

## 2024-09-14 LAB — CBC
Hematocrit: 31.9 % — ABNORMAL LOW (ref 34.0–46.6)
Hemoglobin: 9.6 g/dL — ABNORMAL LOW (ref 11.1–15.9)
MCH: 26.2 pg — ABNORMAL LOW (ref 26.6–33.0)
MCHC: 30.1 g/dL — ABNORMAL LOW (ref 31.5–35.7)
MCV: 87 fL (ref 79–97)
Platelets: 290 x10E3/uL (ref 150–450)
RBC: 3.67 x10E6/uL — ABNORMAL LOW (ref 3.77–5.28)
RDW: 13.7 % (ref 11.7–15.4)
WBC: 5 x10E3/uL (ref 3.4–10.8)

## 2024-09-16 ENCOUNTER — Ambulatory Visit: Payer: Self-pay | Admitting: Emergency Medicine

## 2024-09-17 ENCOUNTER — Other Ambulatory Visit: Payer: Self-pay | Admitting: Cardiology

## 2024-09-18 MED ORDER — AMLODIPINE BESYLATE 5 MG PO TABS: 5.0000 mg | ORAL_TABLET | Freq: Every day | ORAL | 3 refills | Status: AC

## 2024-09-18 NOTE — Progress Notes (Signed)
 Spoke with patient regarding lab result. Patient reported having small bruising on the arm only. She will be making appt with pcp today. She also requested a refill for amlodipine  5 mg. Medication filled with refills.

## 2024-09-23 ENCOUNTER — Encounter: Payer: Self-pay | Admitting: Nurse Practitioner

## 2024-09-23 ENCOUNTER — Ambulatory Visit: Admitting: Nurse Practitioner

## 2024-09-23 VITALS — BP 100/64 | HR 63 | Temp 97.8°F | Ht 64.02 in | Wt 195.0 lb

## 2024-09-23 DIAGNOSIS — D649 Anemia, unspecified: Secondary | ICD-10-CM | POA: Diagnosis not present

## 2024-09-23 NOTE — Progress Notes (Signed)
 "  BP 100/64 (BP Location: Right Arm, Patient Position: Sitting, Cuff Size: Normal)   Pulse 63   Temp 97.8 F (36.6 C) (Oral)   Ht 5' 4.02 (1.626 m)   Wt 195 lb (88.5 kg)   SpO2 98%   BMI 33.45 kg/m    Subjective:    Patient ID: Amanda Ochoa, female    DOB: [birth year], 72 y.o.   MRN: [id number]  HPI: Amanda Ochoa is a 73 y.o. female  Chief Complaint  Patient presents with   office visit    F/u from Cardio. Patient stated she is getting random bruises in her sleep, she is also having SOB every now and then, and lower back pain.    ANEMIA Patient was seen 6 weeks ago and Hgb was 10.6.   Rechecked at Cardiology appt 10 days ago and found to be 9.6.   Anemia status: worse Etiology of anemia: Duration of anemia treatment:  Compliance with treatment: excellent compliance Iron supplementation side effects: no Severity of anemia: mild Fatigue: no Decreased exercise tolerance: yes  Dyspnea on exertion: yes Palpitations: no Bleeding: no Pica: no  Relevant past medical, surgical, family and social history reviewed and updated as indicated. Interim medical history since our last visit reviewed. Allergies and medications reviewed and updated.  Review of Systems  Constitutional:  Negative for fatigue.  Respiratory:  Positive for shortness of breath.   Cardiovascular:  Negative for palpitations.    Per HPI unless specifically indicated above     Objective:    BP 100/64 (BP Location: Right Arm, Patient Position: Sitting, Cuff Size: Normal)   Pulse 63   Temp 97.8 F (36.6 C) (Oral)   Ht 5' 4.02 (1.626 m)   Wt 195 lb (88.5 kg)   SpO2 98%   BMI 33.45 kg/m   Wt Readings from Last 3 Encounters:  09/23/24 195 lb (88.5 kg)  08/12/24 191 lb (86.6 kg)  07/22/24 193 lb (87.5 kg)    Physical Exam Vitals and nursing note reviewed.  Constitutional:      General: She is not in acute distress.    Appearance: Normal appearance. She is normal weight. She is not ill-appearing,  toxic-appearing or diaphoretic.  HENT:     Head: Normocephalic.     Right Ear: External ear normal.     Left Ear: External ear normal.     Nose: Nose normal.     Mouth/Throat:     Mouth: Mucous membranes are moist.     Pharynx: Oropharynx is clear.  Eyes:     General:        Right eye: No discharge.        Left eye: No discharge.     Extraocular Movements: Extraocular movements intact.     Conjunctiva/sclera: Conjunctivae normal.     Pupils: Pupils are equal, round, and reactive to light.  Cardiovascular:     Rate and Rhythm: Normal rate and regular rhythm.     Heart sounds: No murmur heard. Pulmonary:     Effort: Pulmonary effort is normal. No respiratory distress.     Breath sounds: Normal breath sounds. No wheezing or rales.  Musculoskeletal:     Cervical back: Normal range of motion and neck supple.  Skin:    General: Skin is warm and dry.     Capillary Refill: Capillary refill takes less than 2 seconds.  Neurological:     General: No focal deficit present.     Mental Status: She is alert and  oriented to person, place, and time. Mental status is at baseline.  Psychiatric:        Mood and Affect: Mood normal.        Behavior: Behavior normal.        Thought Content: Thought content normal.        Judgment: Judgment normal.     Results for orders placed or performed in visit on 09/13/24  CBC   Collection Time: 09/13/24 10:22 AM  Result Value Ref Range   WBC 5.0 3.4 - 10.8 x10E3/uL   RBC 3.67 (L) 3.77 - 5.28 x10E6/uL   Hemoglobin 9.6 (L) 11.1 - 15.9 g/dL   Hematocrit 68.0 (L) 65.9 - 46.6 %   MCV 87 79 - 97 fL   MCH 26.2 (L) 26.6 - 33.0 pg   MCHC 30.1 (L) 31.5 - 35.7 g/dL   RDW 86.2 88.2 - 84.5 %   Platelets 290 150 - 450 x10E3/uL      Assessment & Plan:   Problem List Items Addressed This Visit   None Visit Diagnoses       Anemia, unspecified type    -  Primary   Repeat CBC and anemia panel done today. Fecal occult ordered. Will make recommendations based  on results. Will likely need iron supplement.   Relevant Orders   Anemia Profile B   Fecal occult blood, imunochemical        Follow up plan: No follow-ups on file.      "

## 2024-09-24 ENCOUNTER — Ambulatory Visit: Payer: Self-pay | Admitting: Nurse Practitioner

## 2024-09-24 ENCOUNTER — Telehealth: Payer: Self-pay

## 2024-09-24 LAB — ANEMIA PROFILE B
Basophils Absolute: 0 x10E3/uL (ref 0.0–0.2)
Basos: 1 %
EOS (ABSOLUTE): 0.1 x10E3/uL (ref 0.0–0.4)
Eos: 2 %
Ferritin: 10 ng/mL — ABNORMAL LOW (ref 15–150)
Folate: 15.6 ng/mL
Hematocrit: 31.3 % — ABNORMAL LOW (ref 34.0–46.6)
Hemoglobin: 9.6 g/dL — ABNORMAL LOW (ref 11.1–15.9)
Immature Grans (Abs): 0 x10E3/uL (ref 0.0–0.1)
Immature Granulocytes: 0 %
Iron Saturation: 9 % — CL (ref 15–55)
Iron: 33 ug/dL (ref 27–139)
Lymphocytes Absolute: 1.5 x10E3/uL (ref 0.7–3.1)
Lymphs: 34 %
MCH: 26.4 pg — ABNORMAL LOW (ref 26.6–33.0)
MCHC: 30.7 g/dL — ABNORMAL LOW (ref 31.5–35.7)
MCV: 86 fL (ref 79–97)
Monocytes Absolute: 0.4 x10E3/uL (ref 0.1–0.9)
Monocytes: 8 %
Neutrophils Absolute: 2.5 x10E3/uL (ref 1.4–7.0)
Neutrophils: 55 %
Platelets: 281 x10E3/uL (ref 150–450)
RBC: 3.63 x10E6/uL — ABNORMAL LOW (ref 3.77–5.28)
RDW: 13.6 % (ref 11.7–15.4)
Retic Ct Pct: 1 % (ref 0.6–2.6)
Total Iron Binding Capacity: 379 ug/dL (ref 250–450)
UIBC: 346 ug/dL (ref 118–369)
Vitamin B-12: 407 pg/mL (ref 232–1245)
WBC: 4.5 x10E3/uL (ref 3.4–10.8)

## 2024-09-24 NOTE — Telephone Encounter (Signed)
 Copied from CRM #[id number]. Topic: Clinical - Prescription Issue >> Sep 24, 2024  2:43 PM Darshell M wrote: Reason for CRM: Patient calling to confirm pcp called in prescription for iron. >> Sep 24, 2024  2:56 PM Mia F wrote: Pt is calling back to see if the iron has been sent. She says she does not want to make multiple trips to the pharmacy today. Please call once sent

## 2024-09-24 NOTE — Telephone Encounter (Signed)
 Was the iron going to be sent in to the pharmacy or did you mean for the patient to get it OTC?

## 2024-09-25 LAB — FECAL OCCULT BLOOD, IMMUNOCHEMICAL: Fecal Occult Bld: NEGATIVE

## 2024-09-27 NOTE — Telephone Encounter (Signed)
 Patient has been called and a message left for them to return the call to the office. Ok for E2C2 to review if/when they return the call. Please do not transfer to CAL rather send a CRM if needed only.  Tried to call patient to review OTC medication.

## 2024-09-27 NOTE — Telephone Encounter (Signed)
 Iron is over the counter.

## 2024-10-17 ENCOUNTER — Telehealth: Payer: Self-pay

## 2024-10-17 NOTE — Telephone Encounter (Signed)
Routing to provider to advise patient.

## 2024-10-17 NOTE — Telephone Encounter (Signed)
 Copied from CRM [id number]. Topic: Clinical - Medication Question >> Oct 17, 2024 11:23 AM Amanda Ochoa wrote: Reason for CRM: Patient has been taking 2 65mg  tablets of iron and cannot buy 150mg  OTC as instructed. Patient would like to know what Amanda Ochoa recommends her to do since she is only getting 130mg  from taking her pills twice a day and not the recommended 150mg .  Please call the patient back at [phone]. She does not want any messages sent through MyChart.

## 2024-10-18 NOTE — Telephone Encounter (Signed)
 Okay to continue with the 130mg  of the medication.  We will recheck it at her next visit.

## 2024-10-18 NOTE — Telephone Encounter (Signed)
 Patient was contacted and informed to continue the 130mg  for her iron.

## 2024-10-21 ENCOUNTER — Other Ambulatory Visit: Payer: Self-pay | Admitting: Nurse Practitioner

## 2024-10-22 ENCOUNTER — Telehealth: Payer: Self-pay | Admitting: Nurse Practitioner

## 2024-10-22 NOTE — Telephone Encounter (Signed)
 Copied from CRM [id number]. Topic: Clinical - Prescription Issue >> Oct 22, 2024 10:31 AM Rea BROCKS wrote: Reason for CRM: losartan  (COZAAR ) 50 MG tablet  [facility] [address] ([address]), [place] - [address] [place]) [place] [address] Phone: [phone] Fax: [phone] Hours: Not open 24 hours  Patient stated that she received all her refills but the Losartan . The pharmacy told her that the medication was cancelled with no reason why. Patient stated that she was not aware of medication being cancelled. Patient asked if she is still on this, she would like a 90 Day supply so she doesn't have to go through this each month.   [phone] (M)

## 2024-10-22 NOTE — Telephone Encounter (Signed)
 Requested Prescriptions  Refused Prescriptions Disp Refills   losartan  (COZAAR ) 50 MG tablet [Pharmacy Med Name: Losartan  Potassium 50 MG Oral Tablet] 90 tablet 0    Sig: Take 1 tablet by mouth once daily     Cardiovascular:  Angiotensin Receptor Blockers Passed - 10/22/2024 11:54 AM      Passed - Cr in normal range and within 180 days    Creatinine, Ser  Date Value Ref Range Status  08/12/2024 0.69 0.57 - 1.00 mg/dL Final         Passed - K in normal range and within 180 days    Potassium  Date Value Ref Range Status  08/12/2024 3.9 3.5 - 5.2 mmol/L Final         Passed - Patient is not pregnant      Passed - Last BP in normal range    BP Readings from Last 1 Encounters:  09/23/24 100/64         Passed - Valid encounter within last 6 months    Recent Outpatient Visits           4 weeks ago Anemia, unspecified type   [facility] Melvin Pao, NP   2 months ago Annual physical exam   [place] Melvin Pao, NP   3 months ago Shortness of breath   [place] [facility] Melvin Pao, NP   8 months ago Infrarenal abdominal aortic aneurysm (AAA) without rupture   [place] [facility] Melvin Pao, NP

## 2024-10-22 NOTE — Telephone Encounter (Signed)
 Spoke with pharmacy. She is aware that the pharmacy had an error and they are now working on filling the prescription.

## 2024-10-30 ENCOUNTER — Other Ambulatory Visit: Payer: Self-pay | Admitting: Nurse Practitioner

## 2024-10-30 DIAGNOSIS — Z1231 Encounter for screening mammogram for malignant neoplasm of breast: Secondary | ICD-10-CM

## 2024-11-07 ENCOUNTER — Ambulatory Visit (INDEPENDENT_AMBULATORY_CARE_PROVIDER_SITE_OTHER): Admitting: Nurse Practitioner

## 2024-11-07 ENCOUNTER — Encounter: Payer: Self-pay | Admitting: Nurse Practitioner

## 2024-11-07 VITALS — BP 119/66 | HR 61 | Temp 97.8°F | Ht 64.02 in | Wt 199.0 lb

## 2024-11-07 DIAGNOSIS — D508 Other iron deficiency anemias: Secondary | ICD-10-CM | POA: Diagnosis not present

## 2024-11-07 NOTE — Assessment & Plan Note (Signed)
 Chronic.  Repeat labs ordered today.  Patient is doing well with iron supplement.  Continue with iron supplement and iron rich foods.  Will make recommendations based on results.

## 2024-11-07 NOTE — Progress Notes (Signed)
 "  BP 119/66 (BP Location: Right Arm, Patient Position: Sitting, Cuff Size: Normal)   Pulse 61   Temp 97.8 F (36.6 C) (Oral)   Ht 5' 4.02 (1.626 m)   Wt 199 lb (90.3 kg)   SpO2 93%   BMI 34.14 kg/m    Subjective:    Patient ID: Amanda Ochoa, female    DOB: [birth year], 74 y.o.   MRN: [id number]  HPI: Enes Wegener is a 74 y.o. female  Chief Complaint  Patient presents with   Office Visit    6 week f/u   ANEMIA Patient was seen 6 weeks ago was started on her iron supplement.  She is taking her iron supplement.  Tolerating it well.  Denies side effects. She is using aloe vera juice and lots of vegetables.  She doesn't eat a ton of meat.  Gets her protein from beans. Shortness of breath has improved some but does still come and go. Anemia status: stable Etiology of anemia: Duration of anemia treatment:  Compliance with treatment: excellent compliance Iron supplementation side effects: no Severity of anemia: mild Fatigue: no Decreased exercise tolerance: yes  Dyspnea on exertion: yes Palpitations: no Bleeding: no Pica: no  Relevant past medical, surgical, family and social history reviewed and updated as indicated. Interim medical history since our last visit reviewed. Allergies and medications reviewed and updated.  Review of Systems  Constitutional:  Negative for fatigue.  Respiratory:  Positive for shortness of breath.   Cardiovascular:  Negative for palpitations.    Per HPI unless specifically indicated above     Objective:    BP 119/66 (BP Location: Right Arm, Patient Position: Sitting, Cuff Size: Normal)   Pulse 61   Temp 97.8 F (36.6 C) (Oral)   Ht 5' 4.02 (1.626 m)   Wt 199 lb (90.3 kg)   SpO2 93%   BMI 34.14 kg/m   Wt Readings from Last 3 Encounters:  11/07/24 199 lb (90.3 kg)  09/23/24 195 lb (88.5 kg)  08/12/24 191 lb (86.6 kg)    Physical Exam Vitals and nursing note reviewed.  Constitutional:      General: She is not in acute distress.     Appearance: Normal appearance. She is normal weight. She is not ill-appearing, toxic-appearing or diaphoretic.  HENT:     Head: Normocephalic.     Right Ear: External ear normal.     Left Ear: External ear normal.     Nose: Nose normal.     Mouth/Throat:     Mouth: Mucous membranes are moist.     Pharynx: Oropharynx is clear.  Eyes:     General:        Right eye: No discharge.        Left eye: No discharge.     Extraocular Movements: Extraocular movements intact.     Conjunctiva/sclera: Conjunctivae normal.     Pupils: Pupils are equal, round, and reactive to light.  Cardiovascular:     Rate and Rhythm: Normal rate and regular rhythm.     Heart sounds: No murmur heard. Pulmonary:     Effort: Pulmonary effort is normal. No respiratory distress.     Breath sounds: Normal breath sounds. No wheezing or rales.  Musculoskeletal:     Cervical back: Normal range of motion and neck supple.  Skin:    General: Skin is warm and dry.     Capillary Refill: Capillary refill takes less than 2 seconds.  Neurological:     General: No  focal deficit present.     Mental Status: She is alert and oriented to person, place, and time. Mental status is at baseline.  Psychiatric:        Mood and Affect: Mood normal.        Behavior: Behavior normal.        Thought Content: Thought content normal.        Judgment: Judgment normal.     Results for orders placed or performed in visit on 09/23/24  Anemia Profile B   Collection Time: 09/23/24 11:26 AM  Result Value Ref Range   Total Iron Binding Capacity 379 250 - 450 ug/dL   UIBC 653 881 - 630 ug/dL   Iron 33 27 - 860 ug/dL   Iron Saturation 9 (LL) 15 - 55 %   Ferritin 10 (L) 15 - 150 ng/mL   Vitamin B-12 407 232 - 1,245 pg/mL   Folate 15.6 >3.0 ng/mL   WBC 4.5 3.4 - 10.8 x10E3/uL   RBC 3.63 (L) 3.77 - 5.28 x10E6/uL   Hemoglobin 9.6 (L) 11.1 - 15.9 g/dL   Hematocrit 68.6 (L) 65.9 - 46.6 %   MCV 86 79 - 97 fL   MCH 26.4 (L) 26.6 - 33.0 pg    MCHC 30.7 (L) 31.5 - 35.7 g/dL   RDW 86.3 88.2 - 84.5 %   Platelets 281 150 - 450 x10E3/uL   Neutrophils 55 Not Estab. %   Lymphs 34 Not Estab. %   Monocytes 8 Not Estab. %   Eos 2 Not Estab. %   Basos 1 Not Estab. %   Neutrophils Absolute 2.5 1.4 - 7.0 x10E3/uL   Lymphocytes Absolute 1.5 0.7 - 3.1 x10E3/uL   Monocytes Absolute 0.4 0.1 - 0.9 x10E3/uL   EOS (ABSOLUTE) 0.1 0.0 - 0.4 x10E3/uL   Basophils Absolute 0.0 0.0 - 0.2 x10E3/uL   Immature Granulocytes 0 Not Estab. 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  Repeat labs ordered today.  Patient is doing well with iron supplement.  Continue with iron supplement and iron rich foods.  Will make recommendations based on results.       Relevant Orders   CBC w/Diff   Iron Binding Cap (TIBC)([facility])      Follow up plan: No follow-ups on file.      "

## 2024-11-08 ENCOUNTER — Ambulatory Visit: Payer: Self-pay | Admitting: Nurse Practitioner

## 2024-11-08 LAB — CBC WITH DIFFERENTIAL/PLATELET
Basophils Absolute: 0 10*3/uL (ref 0.0–0.2)
Basos: 1 %
EOS (ABSOLUTE): 0.1 10*3/uL (ref 0.0–0.4)
Eos: 2 %
Hematocrit: 33.1 % — ABNORMAL LOW (ref 34.0–46.6)
Hemoglobin: 10.1 g/dL — ABNORMAL LOW (ref 11.1–15.9)
Immature Grans (Abs): 0 10*3/uL (ref 0.0–0.1)
Immature Granulocytes: 0 %
Lymphocytes Absolute: 1.8 10*3/uL (ref 0.7–3.1)
Lymphs: 35 %
MCH: 27.3 pg (ref 26.6–33.0)
MCHC: 30.5 g/dL — ABNORMAL LOW (ref 31.5–35.7)
MCV: 90 fL (ref 79–97)
Monocytes Absolute: 0.5 10*3/uL (ref 0.1–0.9)
Monocytes: 10 %
Neutrophils Absolute: 2.7 10*3/uL (ref 1.4–7.0)
Neutrophils: 52 %
Platelets: 294 10*3/uL (ref 150–450)
RBC: 3.7 x10E6/uL — ABNORMAL LOW (ref 3.77–5.28)
RDW: 17.2 % — ABNORMAL HIGH (ref 11.7–15.4)
WBC: 5.1 10*3/uL (ref 3.4–10.8)

## 2024-11-08 LAB — IRON AND TIBC
Iron Saturation: 37 % (ref 15–55)
Iron: 104 ug/dL (ref 27–139)
Total Iron Binding Capacity: 281 ug/dL (ref 250–450)
UIBC: 177 ug/dL (ref 118–369)

## 2024-11-27 ENCOUNTER — Encounter

## 2024-12-02 ENCOUNTER — Ambulatory Visit (INDEPENDENT_AMBULATORY_CARE_PROVIDER_SITE_OTHER): Admitting: Vascular Surgery

## 2025-02-13 ENCOUNTER — Ambulatory Visit: Admitting: Nurse Practitioner

## 2025-04-08 ENCOUNTER — Ambulatory Visit
